# Patient Record
Sex: Female | Born: 1985 | Race: White | Hispanic: No | Marital: Single | Smoking: Never smoker
Health system: Southern US, Community
[De-identification: ages and names within clinical notes are randomized; demographics above are authoritative.]

## PROBLEM LIST (undated history)

## (undated) DIAGNOSIS — R011 Cardiac murmur, unspecified: Secondary | ICD-10-CM

## (undated) DIAGNOSIS — G43909 Migraine, unspecified, not intractable, without status migrainosus: Secondary | ICD-10-CM

## (undated) HISTORY — DX: Migraine, unspecified, not intractable, without status migrainosus: G43.909

## (undated) HISTORY — DX: Cardiac murmur, unspecified: R01.1

---

## 2013-11-18 ENCOUNTER — Encounter: Payer: Self-pay | Admitting: Adult Health

## 2013-11-18 ENCOUNTER — Ambulatory Visit (INDEPENDENT_AMBULATORY_CARE_PROVIDER_SITE_OTHER): Payer: Managed Care, Other (non HMO) | Admitting: Adult Health

## 2013-11-18 ENCOUNTER — Encounter (INDEPENDENT_AMBULATORY_CARE_PROVIDER_SITE_OTHER): Payer: Self-pay

## 2013-11-18 VITALS — BP 102/72 | HR 78 | Temp 97.8°F | Resp 14 | Ht 60.1 in | Wt 91.0 lb

## 2013-11-18 DIAGNOSIS — Z3009 Encounter for other general counseling and advice on contraception: Secondary | ICD-10-CM

## 2013-11-18 DIAGNOSIS — Z30011 Encounter for initial prescription of contraceptive pills: Secondary | ICD-10-CM | POA: Insufficient documentation

## 2013-11-18 MED ORDER — LEVONORGEST-ETH ESTRAD 91-DAY 0.15-0.03 &0.01 MG PO TABS: 1.0000 | ORAL_TABLET | Freq: Every day | ORAL | Status: DC

## 2013-11-18 NOTE — Patient Instructions (Signed)
  Take Vitamin D3 - 2000 units daily  Also, add B12 1000 mcg daily.  Start [place] on the Sunday after you begin your period. Take at the same time daily. You will take an active pill daily for 12 weeks consecutively then no pills on week #13. This is when you should get your period. You may experience spotting the first 2 cycles (12 week cycles) and should resolved by the third cycle.   Call if you have any questions. You can also send me a [employer]. Instructions on how to activate are at the end of the form.  Schedule your complete physical exam at your earliest convenience.

## 2013-11-18 NOTE — Progress Notes (Signed)
Pre visit review using our clinic review tool, if applicable. No additional management support is needed unless otherwise documented below in the visit note. 

## 2013-11-18 NOTE — Progress Notes (Signed)
Patient ID: Ebony Lawson, female   DOB: [birth year], 28 y.o.   MRN: [id number]   Subjective:    Patient ID: Ebony Lawson, female    DOB: [birth year], 28 y.o.   MRN: [id number]  HPI  Pt is a pleasant 28 y/o female who presents to clinic to establish care. She was followed by a primary care provider in [facility]. She recently moved to the area.  PAP - 1-2 years ago. Reports normal. Dentist - Every 6 months    Past Medical History  Diagnosis Date  . Migraine   . Heart murmur      History reviewed. No pertinent past surgical history.   Family History  Problem Relation Age of Onset  . Hyperlipidemia Mother   . Cancer Father     prostate and skin cancer  . Hyperlipidemia Father   . Heart disease Father     CAD - triple bypass 2014  . Hypertension Father   . Hearing loss Paternal Grandfather   . Hypertension Paternal Grandfather      History   Social History  . Marital Status: Single    Spouse Name: N/A    Number of Children: 0  . Years of Education: 16   Occupational History  . 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 Smoking status: Never Smoker   . Smokeless tobacco: Never Used  . Alcohol Use: Yes     Comment: 2-3 drinks monthly  . Drug Use: No  . Sexual Activity: Not on file   Other Topics Concern  . Not on file   Social History Narrative   Ebony Lawson grew up outside of [place]. 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   HENT: Negative.   Eyes: Negative.   Respiratory: Negative.   Cardiovascular: Negative.   Gastrointestinal: Negative.   Endocrine: Negative.   Genitourinary: Negative.   Musculoskeletal: Negative.   Skin: Negative.   Allergic/Immunologic: Negative.   Neurological: Negative.   Hematological:  Negative.   Psychiatric/Behavioral: Negative.        Objective:  BP 102/72  Pulse 78  Temp(Src) 97.8 F (36.6 C) (Oral)  Resp 14  Ht 5' 0.1" (1.527 m)  Wt 91 lb (41.277 kg)  BMI 17.70 kg/m2  SpO2 100%  LMP 10/20/2013   Physical Exam  Constitutional: She is oriented to person, place, and time. She appears well-developed and well-nourished. No distress.  HENT:  Head: Normocephalic and atraumatic.  Eyes: Conjunctivae and EOM are normal.  Neck: Normal range of motion. Neck supple.  Cardiovascular: Normal rate, regular rhythm and intact distal pulses.  Exam reveals no gallop and no friction rub.   No murmur heard. Pulmonary/Chest: Effort normal and breath sounds normal. No respiratory distress. She has no wheezes. She has no rales.  Musculoskeletal: Normal range of motion. She exhibits no edema.  Neurological: She is alert and oriented to person, place, and time. She has normal reflexes.  Skin: Skin is warm and dry.  Psychiatric: She has a normal mood and affect. Her behavior is normal. Judgment and thought content normal.      Assessment & Plan:   1. BCP (birth control pills) initiation Start [place] on the Sunday after  you begin your period. Take at the same time daily. Take an active pill daily for 12 weeks consecutively then no pills on week #13. Spotting may occur the first 2 cycles (12 week cycles) and should resolved by the third cycle.

## 2013-12-09 ENCOUNTER — Encounter: Payer: Self-pay | Admitting: Adult Health

## 2013-12-09 ENCOUNTER — Ambulatory Visit (INDEPENDENT_AMBULATORY_CARE_PROVIDER_SITE_OTHER): Payer: Managed Care, Other (non HMO) | Admitting: Adult Health

## 2013-12-09 VITALS — BP 100/68 | HR 90 | Temp 98.1°F | Resp 14 | Ht 60.1 in | Wt 92.2 lb

## 2013-12-09 DIAGNOSIS — Z124 Encounter for screening for malignant neoplasm of cervix: Secondary | ICD-10-CM | POA: Insufficient documentation

## 2013-12-09 DIAGNOSIS — Z30011 Encounter for initial prescription of contraceptive pills: Secondary | ICD-10-CM

## 2013-12-09 DIAGNOSIS — Z0000 Encounter for general adult medical examination without abnormal findings: Secondary | ICD-10-CM

## 2013-12-09 NOTE — Patient Instructions (Signed)
  You had your annual physical exam today.  Please have your fasting labs at [facility] at your earliest convenience. I will contact you with results once they are available.  Please call with any questions or concerns.  Return in 1 year for your physical or sooner if necessary.

## 2013-12-09 NOTE — Progress Notes (Signed)
Pre visit review using our clinic review tool, if applicable. No additional management support is needed unless otherwise documented below in the visit note. 

## 2013-12-09 NOTE — Progress Notes (Signed)
Patient ID: Ebony Lawson, female   DOB: 02-15-1986, 28 y.o.   MRN: [id number]   Subjective:    Patient ID: Ebony Lawson, female    DOB: Aug 31, 1985, 28 y.o.   MRN: [id number]  HPI  Past Medical History  Diagnosis Date  . Migraine   . Heart murmur     Current Outpatient Prescriptions on File Prior to Visit  Medication Sig Dispense Refill  . Adapalene 0.3 % gel Apply topically at bedtime.      . Calcium Carbonate-Vitamin D (CALCIUM 600+D3 PO) Take 1 tablet by mouth daily.      . Levonorgestrel-Ethinyl Estradiol (AMETHIA,CAMRESE) 0.15-0.03 &0.01 MG tablet Take 1 tablet by mouth daily.  1 Package  4  . Pediatric Multivit-Minerals-C (COMPLETE MULTI-VITAMIN) CHEW Chew 1 tablet by mouth daily. Gummy vitamin       No current facility-administered medications on file prior to visit.     Review of Systems  Constitutional: Negative.   HENT: Negative.   Eyes: Negative.   Respiratory: Negative.   Cardiovascular: Negative.   Gastrointestinal: Negative.   Endocrine: Negative.   Genitourinary: Negative.   Musculoskeletal: Negative.   Skin: Negative.   Allergic/Immunologic: Negative.   Neurological: Negative.   Hematological: Negative.   Psychiatric/Behavioral: Negative.        Objective:  BP 100/68  Pulse 90  Temp(Src) 98.1 F (36.7 C) (Oral)  Resp 14  Wt 92 lb 4 oz (41.844 kg)  SpO2 96%  LMP 12/02/2013   Physical Exam  Constitutional: She is oriented to person, place, and time. She appears well-developed and well-nourished. No distress.  HENT:  Head: Normocephalic and atraumatic.  Right Ear: External ear normal.  Left Ear: External ear normal.  Nose: Nose normal.  Mouth/Throat: Oropharynx is clear and moist.  Eyes: Conjunctivae and EOM are normal. Pupils are equal, round, and reactive to light.  Neck: Normal range of motion. Neck supple. No tracheal deviation present. No thyromegaly present.  Cardiovascular: Normal rate, regular rhythm, normal heart sounds and intact  distal pulses.  Exam reveals no gallop and no friction rub.   No murmur heard. Pulmonary/Chest: Effort normal and breath sounds normal. No respiratory distress. She has no wheezes. She has no rales.  Abdominal: Soft. Bowel sounds are normal. She exhibits no distension and no mass. There is no tenderness. There is no rebound and no guarding.  Musculoskeletal: Normal range of motion. She exhibits no edema and no tenderness.  Lymphadenopathy:    She has no cervical adenopathy.  Neurological: She is alert and oriented to person, place, and time. She has normal reflexes. No cranial nerve deficit. Coordination normal.  Skin: Skin is warm and dry.  Psychiatric: She has a normal mood and affect. Her behavior is normal. Judgment and thought content normal.      Assessment & Plan:   1. Routine general medical examination at a health care facility Normal physical exam excluding PAP. She will be due for her PAP in 2016. Screenings addressed. She reports having gardasil vaccination series. We have not received records from previous provider yet. Return in 1 year or prn.

## 2013-12-10 LAB — CBC AND DIFFERENTIAL
HEMATOCRIT: 37 % (ref 36–46)
Hemoglobin: 12.7 g/dL (ref 12.0–16.0)
Neutrophils Absolute: 3 /uL
Platelets: 228 10*3/uL (ref 150–399)
WBC: 6.3 10^3/mL

## 2013-12-10 LAB — HEPATIC FUNCTION PANEL
ALT: 11 U/L (ref 7–35)
AST: 10 U/L — AB (ref 13–35)
Alkaline Phosphatase: 41 U/L (ref 25–125)
Bilirubin, Total: 0.3 mg/dL

## 2013-12-10 LAB — BASIC METABOLIC PANEL
BUN: 11 mg/dL (ref 4–21)
Creatinine: 0.6 mg/dL (ref <=1.1)
GLUCOSE: 92 mg/dL
POTASSIUM: 4.1 mmol/L (ref 3.4–5.3)
Sodium: 140 mmol/L (ref 137–147)

## 2013-12-10 LAB — LIPID PANEL
Cholesterol: 130 mg/dL (ref 0–200)
HDL: 49 mg/dL (ref 35–70)
LDL Cholesterol: 56 mg/dL
Triglycerides: 123 mg/dL (ref 40–160)

## 2013-12-10 LAB — TSH: TSH: 2.44 u[IU]/mL (ref <=5.90)

## 2013-12-23 ENCOUNTER — Telehealth: Payer: Self-pay

## 2013-12-23 NOTE — Telephone Encounter (Signed)
Per Raquel: Spoke to patient and notified her that her Vitamin D levels are low. Raquel would like you to start OTC D3 1,000 units daily. Patient verbalized understanding.

## 2014-01-01 ENCOUNTER — Encounter: Payer: Self-pay | Admitting: Adult Health

## 2014-12-08 ENCOUNTER — Other Ambulatory Visit: Payer: Self-pay | Admitting: *Deleted

## 2014-12-08 MED ORDER — LEVONORGEST-ETH ESTRAD 91-DAY 0.15-0.03 &0.01 MG PO TABS: 1.0000 | ORAL_TABLET | Freq: Every day | ORAL | Status: DC

## 2014-12-08 NOTE — Telephone Encounter (Signed)
Appt 12/16/14

## 2014-12-16 ENCOUNTER — Encounter: Payer: Managed Care, Other (non HMO) | Admitting: Adult Health

## 2014-12-24 ENCOUNTER — Other Ambulatory Visit (HOSPITAL_COMMUNITY)
Admission: RE | Admit: 2014-12-24 | Discharge: 2014-12-24 | Disposition: A | Discharge status: Home / Self Care | Payer: Managed Care, Other (non HMO) | Source: Ambulatory Visit | Attending: Nurse Practitioner | Admitting: Nurse Practitioner

## 2014-12-24 ENCOUNTER — Ambulatory Visit (INDEPENDENT_AMBULATORY_CARE_PROVIDER_SITE_OTHER): Payer: Managed Care, Other (non HMO) | Admitting: Nurse Practitioner

## 2014-12-24 ENCOUNTER — Encounter: Payer: Self-pay | Admitting: Nurse Practitioner

## 2014-12-24 VITALS — BP 102/66 | HR 80 | Temp 97.8°F | Resp 14 | Ht 60.0 in | Wt 97.0 lb

## 2014-12-24 DIAGNOSIS — Z0000 Encounter for general adult medical examination without abnormal findings: Secondary | ICD-10-CM

## 2014-12-24 DIAGNOSIS — Z1151 Encounter for screening for human papillomavirus (HPV): Secondary | ICD-10-CM | POA: Diagnosis present

## 2014-12-24 DIAGNOSIS — Z309 Encounter for contraceptive management, unspecified: Secondary | ICD-10-CM

## 2014-12-24 DIAGNOSIS — Z01419 Encounter for gynecological examination (general) (routine) without abnormal findings: Secondary | ICD-10-CM | POA: Insufficient documentation

## 2014-12-24 DIAGNOSIS — R87810 Cervical high risk human papillomavirus (HPV) DNA test positive: Secondary | ICD-10-CM | POA: Insufficient documentation

## 2014-12-24 DIAGNOSIS — Z30011 Encounter for initial prescription of contraceptive pills: Secondary | ICD-10-CM

## 2014-12-24 MED ORDER — NORETHINDRONE ACET-ETHINYL EST 1-20 MG-MCG PO TABS: 1.0000 | ORAL_TABLET | Freq: Every day | ORAL | Status: DC

## 2014-12-24 NOTE — Assessment & Plan Note (Signed)
Pt does not care for her 91 day cycle medication. She is experiencing breakthrough bleeding after being on it for 1 year. Will switch to loestrin. FU prn worsening/failure to improve.

## 2014-12-24 NOTE — Progress Notes (Signed)
Pre visit review using our clinic review tool, if applicable. No additional management support is needed unless otherwise documented below in the visit note. 

## 2014-12-24 NOTE — Patient Instructions (Signed)

## 2014-12-24 NOTE — Addendum Note (Signed)
Addended by: Karlene Einstein D on: 12/24/2014 02:10 PM   Modules accepted: Orders

## 2014-12-24 NOTE — Progress Notes (Signed)
Subjective:    Patient ID: Ebony Lawson, female    DOB: 1985-09-11, 29 y.o.   MRN: [id number]  HPI  Ebony Lawson is a 29 yo female with a need for her annual physical.   1) Health Maintenance-   Diet- No formal, tries to eat "well"   Exercise- No formal   Immunizations- UTD  Pap- Due today  Eye Exam- Not UTD  Dental Exam- UTD  2) Chronic Problems-  Heart Murmur- Innocent murmur since childhood  Migraine- occasional migraines- not worsening, 1 aura   3) Acute Problems-  Uses 3 month long OCP- spotting between packs and has been taking for 1 year.   Left knee- up and down stairs, notices it when she goes hiking, denies instability, popping/clicking   Pressure when going down stairs like something "detatched"   Review of Systems  Constitutional: Negative for fever, chills, diaphoresis, fatigue and unexpected weight change.  HENT: Negative for tinnitus and trouble swallowing.   Eyes: Negative for visual disturbance.  Respiratory: Negative for chest tightness, shortness of breath and wheezing.   Cardiovascular: Negative for chest pain, palpitations and leg swelling.  Gastrointestinal: Negative for nausea, vomiting, abdominal pain, diarrhea, constipation and blood in stool.  Genitourinary: Negative for dysuria, hematuria, vaginal discharge and vaginal pain.  Musculoskeletal: Positive for arthralgias. Negative for myalgias, back pain and gait problem.       Left knee  Skin: Negative for color change and rash.  Neurological: Negative for dizziness, weakness, numbness and headaches.  Hematological: Does not bruise/bleed easily.  Psychiatric/Behavioral: Negative for suicidal ideas and sleep disturbance. The patient is not nervous/anxious.    Past Medical History  Diagnosis Date  . Migraine   . Heart murmur     History   Social History  . Marital Status: Single    Spouse Name: N/A  . Number of Children: 0  . Years of Education: 16   Occupational History  . 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 Smoking status: Never Smoker   . Smokeless tobacco: Never Used  . Alcohol Use: 0.0 oz/week    0 Standard drinks or equivalent per week     Comment: 2-3 drinks monthly  . Drug Use: No  . Sexual Activity:    Partners: Male    Birth Control/ Protection: OCP     Comment: 1 partner    Other Topics Concern  . Not on file   Social History Narrative   Ebony Lawson grew up outside of [place]. 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  Family History  Problem Relation Age of Onset  . Hyperlipidemia Mother   . Cancer Father     prostate and skin cancer  . Hyperlipidemia Father   . Heart disease Father     CAD - triple bypass 2014  . Hypertension Father   . Hearing loss Paternal Grandfather   . Hypertension Paternal Grandfather     No Known Allergies  Current Outpatient Prescriptions on File Prior to Visit  Medication Sig Dispense Refill  . Adapalene 0.3 % gel Apply topically at bedtime.    . Calcium Carbonate-Vitamin D (CALCIUM 600+D3 PO) Take 1 tablet by mouth daily.    Marland Kitchen  Pediatric Multivit-Minerals-C (COMPLETE MULTI-VITAMIN) CHEW Chew 1 tablet by mouth daily. Gummy vitamin     No current facility-administered medications on file prior to visit.       Objective:   Physical Exam  Constitutional: She is oriented to person, place, and time. She appears well-developed and well-nourished. No distress.  BP 102/66 mmHg  Pulse 80  Temp(Src) 97.8 F (36.6 C)  Resp 14  Ht 5' (1.524 m)  Wt 97 lb (43.999 kg)  BMI 18.94 kg/m2  SpO2 98%   HENT:  Head: Normocephalic and atraumatic.  Right Ear: External ear normal.  Left Ear: External ear normal.  Eyes: EOM are normal. Pupils are equal, round, and reactive to light. Right eye exhibits  no discharge. Left eye exhibits no discharge. No scleral icterus.  Neck: Normal range of motion. Neck supple. No thyromegaly present.  Cardiovascular: Normal rate and regular rhythm.  Exam reveals no gallop and no friction rub.   Murmur heard. Pt reports murmur- unable to hear one today  Pulmonary/Chest: Effort normal and breath sounds normal. No respiratory distress. She has no wheezes. She has no rales. She exhibits no tenderness.  Abdominal: Soft. Bowel sounds are normal. She exhibits no distension and no mass. There is no tenderness. There is no rebound and no guarding.  Genitourinary: Vagina normal. No vaginal discharge found.  Musculoskeletal: Normal range of motion. She exhibits no edema or tenderness.  Lymphadenopathy:    She has no cervical adenopathy.  Neurological: She is alert and oriented to person, place, and time. No cranial nerve deficit. She exhibits normal muscle tone. Coordination normal.  Skin: Skin is warm and dry. No rash noted. She is not diaphoretic.  Psychiatric: She has a normal mood and affect. Her behavior is normal. Judgment and thought content normal.      Assessment & Plan:

## 2014-12-24 NOTE — Assessment & Plan Note (Addendum)
Discussed acute and chronic issues. Reviewed health maintenance measures, PFSHx, and immunizations. Obtain routine labs TSH, Lipid panel, CBC w/ diff, A1c, and CMET at [facility]  PAP done today, no significant findings, clinical breast exam performed today.

## 2014-12-25 LAB — CYTOLOGY - PAP

## 2014-12-28 ENCOUNTER — Other Ambulatory Visit: Payer: Self-pay | Admitting: Nurse Practitioner

## 2014-12-28 MED ORDER — FLUCONAZOLE 150 MG PO TABS: 150.0000 mg | ORAL_TABLET | Freq: Once | ORAL | Status: DC

## 2014-12-31 LAB — CBC AND DIFFERENTIAL
HCT: 39 % (ref 36–46)
HEMOGLOBIN: 13.2 g/dL (ref 12.0–16.0)
PLATELETS: 180 10*3/uL (ref 150–399)
WBC: 6.3 10*3/mL

## 2014-12-31 LAB — LIPID PANEL
Cholesterol: 163 mg/dL (ref 0–200)
HDL: 47 mg/dL (ref 35–70)
LDL CALC: 102 mg/dL
TRIGLYCERIDES: 69 mg/dL (ref 40–160)

## 2014-12-31 LAB — BASIC METABOLIC PANEL
BUN: 9 mg/dL (ref 4–21)
CREATININE: 0.7 mg/dL (ref 0.5–1.1)
Potassium: 4 mmol/L (ref 3.4–5.3)
Sodium: 138 mmol/L (ref 137–147)

## 2014-12-31 LAB — TSH: TSH: 2.49 u[IU]/mL (ref 0.41–5.90)

## 2014-12-31 LAB — HEPATIC FUNCTION PANEL
ALK PHOS: 33 U/L (ref 25–125)
ALT: 8 U/L (ref 7–35)
AST: 10 U/L — AB (ref 13–35)
Bilirubin, Total: 0.4 mg/dL

## 2014-12-31 LAB — HEMOGLOBIN A1C: HEMOGLOBIN A1C: 5.2 % (ref 4.0–6.0)

## 2015-01-01 ENCOUNTER — Encounter: Payer: Self-pay | Admitting: Nurse Practitioner

## 2015-01-05 ENCOUNTER — Encounter: Payer: Self-pay | Admitting: Nurse Practitioner

## 2015-04-20 ENCOUNTER — Other Ambulatory Visit: Payer: Self-pay | Admitting: Nurse Practitioner

## 2015-04-20 ENCOUNTER — Encounter: Payer: Self-pay | Admitting: Nurse Practitioner

## 2015-04-22 ENCOUNTER — Encounter: Payer: Self-pay | Admitting: Nurse Practitioner

## 2015-04-22 ENCOUNTER — Ambulatory Visit (INDEPENDENT_AMBULATORY_CARE_PROVIDER_SITE_OTHER): Payer: Managed Care, Other (non HMO) | Admitting: Nurse Practitioner

## 2015-04-22 VITALS — BP 108/62 | HR 94 | Temp 98.3°F | Resp 12 | Ht 60.0 in | Wt 96.4 lb

## 2015-04-22 DIAGNOSIS — Z30011 Encounter for initial prescription of contraceptive pills: Secondary | ICD-10-CM

## 2015-04-22 DIAGNOSIS — Z309 Encounter for contraceptive management, unspecified: Secondary | ICD-10-CM

## 2015-04-22 MED ORDER — DROSPIRENONE-ETHINYL ESTRADIOL 3-0.02 MG PO TABS: 1.0000 | ORAL_TABLET | Freq: Every day | ORAL | Status: DC

## 2015-04-22 NOTE — Assessment & Plan Note (Signed)
Switched to [place] to try. Pt has acne that flares around menses. Will try this for 3-6 months. FU by MyChart or in 6 months.

## 2015-04-22 NOTE — Progress Notes (Signed)
Pre visit review using our clinic review tool, if applicable. No additional management support is needed unless otherwise documented below in the visit note. 

## 2015-04-22 NOTE — Patient Instructions (Addendum)
Drospirenone; Ethinyl Estradiol tablets What is this medicine? DROSPIRENONE; ETHINYL ESTRADIOL (dro SPY re nown; ETH in il es tra DYE ole) is an oral contraceptive (birth control pill). This medicine combines two types of female hormones, an estrogen and a progestin. It is used to prevent ovulation and pregnancy. This medicine may be used for other purposes; ask your health care provider or pharmacist if you have questions. What should I tell my health care provider before I take this medicine? They need to know if you have or ever had any of these conditions: -abnormal vaginal bleeding -adrenal gland disease -blood vessel disease or blood clots -breast, cervical, endometrial, ovarian, liver, or uterine cancer -diabetes -gallbladder disease -heart disease or recent heart attack -high blood pressure -high cholesterol -high potassium level -kidney disease -liver disease -migraine headaches -stroke -systemic lupus erythematosus (SLE) -tobacco smoker -an unusual or allergic reaction to estrogens, progestins, or other medicines, foods, dyes, or preservatives -pregnant or trying to get pregnant -breast-feeding How should I use this medicine? Take this medicine by mouth. To reduce nausea, this medicine may be taken with food. Follow the directions on the prescription label. Take this medicine at the same time each day and in the order directed on the package. Do not take your medicine more often than directed. A patient package insert for the product will be given with each prescription and refill. Read this sheet carefully each time. The sheet may change frequently. Talk to your pediatrician regarding the use of this medicine in children. Special care may be needed. This medicine has been used in female children who have started having menstrual periods. Overdosage: If you think you have taken too much of this medicine contact a poison control center or emergency room at once. NOTE: This  medicine is only for you. Do not share this medicine with others. What if I miss a dose? If you miss a dose, refer to the patient information sheet you received with your medicine for direction. If you miss more than one pill, this medicine may not be as effective and you may need to use another form of birth control. What may interact with this medicine? -acetaminophen -antibiotics or medicines for infections, especially rifampin, rifabutin, rifapentine, and griseofulvin, and possibly penicillins or tetracyclines -aprepitant -ascorbic acid (vitamin C) -atorvastatin -barbiturate medicines, such as phenobarbital -bosentan -carbamazepine -caffeine -clofibrate -cyclosporine -dantrolene -doxercalciferol -felbamate -grapefruit juice -hydrocortisone -medicines for anxiety or sleeping problems, such as diazepam or temazepam -medicines for diabetes, including pioglitazone -mineral oil -modafinil -mycophenolate -nefazodone -oxcarbazepine -phenytoin -prednisolone -ritonavir or other medicines for HIV infection or AIDS -rosuvastatin -selegiline -soy isoflavones supplements -St. John's wort -tamoxifen or raloxifene -theophylline -thyroid hormones -topiramate -warfarin This product is different from other birth control pills because it contains the progestin drospirenone. Drospirenone may increase potassium levels. Interactions with other drugs may increase the chance of an elevated potassium level. You may need blood tests to check your potassium level. Drugs that can increase the potassium level include: -certain medications for high blood pressure or heart conditions (examples include ACE-inhibitors and also Angiotensin-II receptor blockers, and Eplerenone -dietary salt substitutes (these may contain potassium) -heparin -NSAIDs (antiinflammatory drugs), if they are taken long-term and daily, like for arthritis -potassium supplements -some 'water pills' (diuretics like amiloride,  spironolactone or triamterene) This list may not describe all possible interactions. Give your health care provider a list of all the medicines, herbs, non-prescription drugs, or dietary supplements you use. Also tell them if you smoke, drink alcohol, or use illegal  drugs. Some items may interact with your medicine. What should I watch for while using this medicine? Visit your doctor or health care professional for regular checks on your progress. You will need a regular breast and pelvic exam and Pap smear while on this medicine. Use an additional method of contraception during the first cycle that you take these tablets. If you have any reason to think you are pregnant, stop taking this medicine right away and contact your doctor or health care professional. If you are taking this medicine for hormone related problems, it may take several cycles of use to see improvement in your condition. Smoking increases the risk of getting a blood clot or having a stroke while you are taking birth control pills, especially if you are more than 27 years old. You are strongly advised not to smoke. This medicine can make your body retain fluid, making your fingers, hands, or ankles swell. Your blood pressure can go up. Contact your doctor or health care professional if you feel you are retaining fluid. This medicine can make you more sensitive to the sun. Keep out of the sun. If you cannot avoid being in the sun, wear protective clothing and use sunscreen. Do not use sun lamps or tanning beds/booths. If you wear contact lenses and notice visual changes, or if the lenses begin to feel uncomfortable, consult your eye care specialist. In some women, tenderness, swelling, or minor bleeding of the gums may occur. Notify your dentist if this happens. Brushing and flossing your teeth regularly may help limit this. See your dentist regularly and inform your dentist of the medicines you are taking. If you are going to have  elective surgery, you may need to stop taking this medicine before the surgery. Consult your health care professional for advice. This medicine does not protect you against HIV infection (AIDS) or any other sexually transmitted diseases. What side effects may I notice from receiving this medicine? Side effects that you should report to your doctor or health care professional as soon as possible: -allergic reactions like skin rash, itching or hives, swelling of the face, lips, or tongue -breast tissue changes or discharge -changes in vision -chest pain -confusion, trouble speaking or understanding -dark urine -general ill feeling or flu-like symptoms -light-colored stools -nausea, vomiting -pain, swelling, warmth in the leg -right upper belly pain -severe headaches -shortness of breath -sudden numbness or weakness of the face, arm or leg -trouble walking, dizziness, loss of balance or coordination -unusual vaginal bleeding -yellowing of the eyes or skin Side effects that usually do not require medical attention (report to your doctor or health care professional if they continue or are bothersome): -acne -brown spots on the face -change in appetite -change in sexual desire -depressed mood or mood swings -fluid retention and swelling -stomach cramps or bloating -unusually weak or tired -weight gain This list may not describe all possible side effects. Call your doctor for medical advice about side effects. You may report side effects to FDA at 1-800-FDA-1088. Where should I keep my medicine? Keep out of the reach of children. Store at room temperature between 15 and 30 degrees C (59 and 86 degrees F). Throw away any unused medicine after the expiration date. NOTE: This sheet is a summary. It may not cover all possible information. If you have questions about this medicine, talk to your doctor, pharmacist, or health care provider.    2016, Elsevier/Gold Standard. (2008-05-07  13:02:54)

## 2015-04-22 NOTE — Progress Notes (Signed)
Patient ID: Ebony Lawson, female    DOB: 1986/03/08  Age: 29 y.o. MRN: [id number]  CC: Medication Problem   HPI Ebony Lawson presents for follow up of birth control management.   1) Breakthrough bleeding on 3 month OCP.  Switched in July to Loestrin Still having break through  Taking at 10:25 pm daily, Denies skipping or missing multiple times (maybe once).  Random spotting on week 3 for 1 week then nothing on week off.   History Ebony Lawson has a past medical history of Migraine and Heart murmur.   She has no past surgical history on file.   Her family history includes Cancer in her father; Hearing loss in her paternal grandfather; Heart disease in her father; Hyperlipidemia in her father and mother; Hypertension in her father and paternal grandfather.She reports that she has never smoked. She has never used smokeless tobacco. She reports that she drinks alcohol. She reports that she does not use illicit drugs.  Outpatient Prescriptions Prior to Visit  Medication Sig Dispense Refill  . Adapalene 0.3 % gel Apply topically at bedtime.    . Calcium Carbonate-Vitamin D (CALCIUM 600+D3 PO) Take 1 tablet by mouth daily.    . fluconazole (DIFLUCAN) 150 MG tablet Take 1 tablet (150 mg total) by mouth once. 1 tablet 0  . Pediatric Multivit-Minerals-C (COMPLETE MULTI-VITAMIN) CHEW Chew 1 tablet by mouth daily. Gummy vitamin    . norethindrone-ethinyl estradiol (MICROGESTIN,JUNEL,LOESTRIN) 1-20 MG-MCG tablet Take 1 tablet by mouth daily. 1 Package 11   No facility-administered medications prior to visit.    ROS Review of Systems  Constitutional: Negative for fever, chills, diaphoresis and fatigue.  Genitourinary: Positive for menstrual problem. Negative for pelvic pain.    Objective:  BP 108/62 mmHg  Pulse 94  Temp(Src) 98.3 F (36.8 C)  Resp 12  Ht 5' (1.524 m)  Wt 96 lb 6.4 oz (43.727 kg)  BMI 18.83 kg/m2  SpO2 96%  Physical Exam  Constitutional: She appears well-developed and  well-nourished. No distress.  Genitourinary:  Deferred  Skin: She is not diaphoretic.  Psychiatric: She has a normal mood and affect. Her behavior is normal. Judgment and thought content normal.   Assessment & Plan:   There are no diagnoses linked to this encounter. I have discontinued Ms. Ebony Lawson's norethindrone-ethinyl estradiol. I am also having her start on drospirenone-ethinyl estradiol. Additionally, I am having her maintain her COMPLETE MULTI-VITAMIN, Calcium Carbonate-Vitamin D (CALCIUM 600+D3 PO), Adapalene, and fluconazole.  Meds ordered this encounter  Medications  . drospirenone-ethinyl estradiol (YAZ,GIANVI,LORYNA) 3-0.02 MG tablet    Sig: Take 1 tablet by mouth daily.    Dispense:  1 Package    Refill:  5    Order Specific Question:  Supervising Provider    Answer:  Ebony Lawson [[id number]]     Follow-up: Return in about 6 months (around 10/20/2015) for Birth Control Maintenance .

## 2015-04-26 ENCOUNTER — Other Ambulatory Visit: Payer: Self-pay | Admitting: Nurse Practitioner

## 2015-04-26 ENCOUNTER — Encounter: Payer: Self-pay | Admitting: Nurse Practitioner

## 2015-10-10 ENCOUNTER — Other Ambulatory Visit: Payer: Self-pay | Admitting: Nurse Practitioner

## 2015-10-11 NOTE — Telephone Encounter (Signed)
Refilled in November. Last seen same time. Please advise?

## 2015-12-28 ENCOUNTER — Encounter: Payer: Managed Care, Other (non HMO) | Admitting: Nurse Practitioner

## 2015-12-30 ENCOUNTER — Encounter: Payer: Managed Care, Other (non HMO) | Admitting: Nurse Practitioner

## 2017-09-06 ENCOUNTER — Other Ambulatory Visit: Payer: Managed Care, Other (non HMO) | Admitting: Nurse Practitioner

## 2017-09-06 ENCOUNTER — Ambulatory Visit: Payer: Managed Care, Other (non HMO) | Admitting: Nurse Practitioner

## 2017-09-06 ENCOUNTER — Encounter: Payer: Self-pay | Admitting: Nurse Practitioner

## 2017-09-06 VITALS — BP 103/71 | HR 87 | Resp 16 | Ht 60.0 in | Wt 96.4 lb

## 2017-09-06 DIAGNOSIS — F411 Generalized anxiety disorder: Secondary | ICD-10-CM

## 2017-09-06 DIAGNOSIS — R87820 Cervical low risk human papillomavirus (HPV) DNA test positive: Secondary | ICD-10-CM

## 2017-09-06 DIAGNOSIS — Z124 Encounter for screening for malignant neoplasm of cervix: Secondary | ICD-10-CM | POA: Diagnosis not present

## 2017-09-06 NOTE — Progress Notes (Signed)
[facility] [place], [place] [address]  Internal MEDICINE  Office Visit Note  Patient Name: Ebony Lawson  [id number]  [id number]  Date of Service: 09/07/2017   Chief Complaint  Patient presents with  . Gynecologic Exam    history of abnormal pap with HPV positive  . Anxiety     The patient is here for routine follow up visit. She has diagnosed history of abnormal Pap, ASC-us with HPV positive. She did have a consultation with GYN, who told her that everything looked ok and she should continue to be followed by PCP.  Today, she is c/o of anxiety. Sometimes she is having panic reactions. Would like to seek help from a counselor. Does not want to take or be on any medications for this.   Pt is here for a pap smear   Current Medication: Outpatient Encounter Medications as of 09/06/2017  Medication Sig  . Adapalene 0.3 % gel Apply topically at bedtime.  . Calcium Carbonate-Vitamin D (CALCIUM 600+D3 PO) Take 1 tablet by mouth daily.  . drospirenone-ethinyl estradiol (YAZ,GIANVI,LORYNA) 3-0.02 MG tablet take 1 tablet by mouth daily (Patient not taking: Reported on 09/06/2017)  . fluconazole (DIFLUCAN) 150 MG tablet Take 1 tablet (150 mg total) by mouth once. (Patient not taking: Reported on 09/06/2017)  . Melatonin 10 MG TABS Take 1 tablet by mouth at bedtime as needed.  . Pediatric Multivit-Minerals-C (COMPLETE MULTI-VITAMIN) CHEW Chew 1 tablet by mouth daily. Gummy vitamin   No facility-administered encounter medications on file as of 09/06/2017.     Surgical History: No past surgical history on file.  Medical History: Past Medical History:  Diagnosis Date  . Heart murmur   . Migraine     Family History: Family History  Problem Relation Age of Onset  . Hyperlipidemia Mother   . Cancer Father        prostate and skin cancer  . Hyperlipidemia Father   . Heart disease Father        CAD - triple bypass 2014  . Hypertension Father   . Hearing loss  Paternal Grandfather   . Hypertension Paternal Grandfather     Social History   Socioeconomic History  . Marital status: Single    Spouse name: Not on file  . Number of children: 0  . Years of education: 57  . Highest education level: Not on file  Occupational History  . Occupation: DNA Animal nutritionist: [employer]  . Financial resource strain: Not on file  . Food insecurity:    Worry: Not on file    Inability: Not on file  . Transportation needs:    Medical: Not on file    Non-medical: Not on file  Tobacco Use  . Smoking status: Never Smoker  . Smokeless tobacco: Never Used  Substance and Sexual Activity  . Alcohol use: Yes    Alcohol/week: 0.0 oz    Comment: 2-3 drinks monthly  . Drug use: No  . Sexual activity: Yes    Partners: Male    Birth control/protection: OCP    Comment: 1 partner   Lifestyle  . Physical activity:    Days per week: Not on file    Minutes per session: Not on file  . Stress: Not on file  Relationships  . Social connections:    Talks on phone: Not on file    Gets together: Not on file    Attends religious service: Not  on file    Active member of club or organization: Not on file    Attends meetings of clubs or organizations: Not on file    Relationship status: Not on file  . Intimate partner violence:    Fear of current or ex partner: Not on file    Emotionally abused: Not on file    Physically abused: Not on file    Forced sexual activity: Not on file  Other Topics Concern  . Not on file  Social History Narrative   Jiana grew up outside of [facility]. She attended [employer] and obtained a Manufacturing engineer in [employer] in [place]. 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  HENT: Positive for postnasal drip and  rhinorrhea. Negative for congestion, sneezing and sore throat.   Eyes: Negative.  Negative for redness.  Respiratory: Negative for cough, chest tightness, shortness of breath and wheezing.   Cardiovascular: Negative for chest pain and palpitations.  Gastrointestinal: Negative for abdominal pain, constipation, diarrhea, nausea and vomiting.  Endocrine: Negative for cold intolerance, heat intolerance, polydipsia, polyphagia and polyuria.  Genitourinary: Negative for dysuria and frequency.       History of abnormal pap smears with HPV positive.  Regular menstrual periods.  Musculoskeletal: Negative for arthralgias, back pain, joint swelling and neck pain.  Skin: Negative for rash.  Allergic/Immunologic: Positive for environmental allergies.  Neurological: Negative.  Negative for tremors and numbness.  Hematological: Negative for adenopathy. Does not bruise/bleed easily.  Psychiatric/Behavioral: Positive for behavioral problems (Depression). Negative for sleep disturbance and suicidal ideas. The patient is nervous/anxious.      Today's Vitals   09/06/17 1406  BP: 103/71  Pulse: 87  Resp: 16  SpO2: 98%  Weight: 96 lb 6.4 oz (43.7 kg)  Height: 5' (1.524 m)    Physical Exam  Constitutional: She is oriented to person, place, and time. She appears well-developed and well-nourished. No distress.  HENT:  Head: Normocephalic and atraumatic.  Mouth/Throat: Oropharynx is clear and moist. No oropharyngeal exudate.  Eyes: Pupils are equal, round, and reactive to light. EOM are normal.  Neck: Normal range of motion. Neck supple. No JVD present. No tracheal deviation present. No thyromegaly present.  Cardiovascular: Normal rate, regular rhythm and normal heart sounds. Exam reveals no gallop and no friction rub.  No murmur heard. Pulmonary/Chest: Effort normal. No respiratory distress. She has no wheezes. She has no rales. She exhibits no tenderness.  Abdominal: Soft. Bowel sounds are normal.   Genitourinary: Vagina normal. Pelvic exam was performed with patient supine. No labial fusion. There is no rash, tenderness, lesion or injury on the right labia. There is no rash, tenderness, lesion or injury on the left labia. Cervix exhibits no motion tenderness, no discharge and no friability.  Musculoskeletal: Normal range of motion.  Lymphadenopathy:    She has no cervical adenopathy.  Neurological: She is alert and oriented to person, place, and time. No cranial nerve deficit.  Skin: Skin is warm and dry. She is not diaphoretic.  Psychiatric: Her speech is normal and behavior is normal. Judgment and thought content normal. Her mood appears anxious. Cognition and memory are normal.  Nursing note and vitals reviewed.   Assessment/Plan:  1. 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 New pap obtained today. Will closely monitor as indicated.  2. Routine cervical smear - Pap IG and HPV (high risk) DNA detection  3. Generalized anxiety disorder - Ambulatory referral to Psychiatry    General Counseling: Ebony Lawson verbalizes understanding of the findings of todays visit and agrees with plan of treatment. I have discussed any further diagnostic evaluation that may be needed or ordered today. We also reviewed her medications today. she has been encouraged to call the office with any questions or concerns that should arise related to todays visit.  This patient was seen by Leretha Pol, FNP- C in Collaboration with Dr Lavera Guise as a part of collaborative care agreement    Orders Placed This Encounter  Procedures  . Ambulatory referral to Psychiatry     Time spent: 25 Minutes

## 2017-09-07 ENCOUNTER — Encounter: Payer: Self-pay | Admitting: Nurse Practitioner

## 2017-09-07 DIAGNOSIS — R87820 Cervical low risk human papillomavirus (HPV) DNA test positive: Secondary | ICD-10-CM | POA: Insufficient documentation

## 2017-09-07 DIAGNOSIS — F411 Generalized anxiety disorder: Secondary | ICD-10-CM | POA: Insufficient documentation

## 2017-09-10 LAB — PAP IG AND HPV HIGH-RISK
HPV, high-risk: POSITIVE — AB
PAP Smear Comment: 0

## 2017-09-12 ENCOUNTER — Telehealth: Payer: Self-pay

## 2017-09-12 NOTE — Telephone Encounter (Signed)
Left vm for pt to return call on lab (pap) results.  dbs

## 2017-09-14 ENCOUNTER — Telehealth: Payer: Self-pay

## 2017-09-14 NOTE — Telephone Encounter (Signed)
Pt returned call about labs and I gave results and informed her that we will repeat pap in 1 year.  dbs

## 2017-10-31 ENCOUNTER — Encounter: Payer: Self-pay | Admitting: Nurse Practitioner

## 2017-11-09 ENCOUNTER — Encounter: Payer: Self-pay | Admitting: Nurse Practitioner

## 2018-01-17 ENCOUNTER — Telehealth: Payer: Self-pay | Admitting: Nurse Practitioner

## 2018-01-17 NOTE — Telephone Encounter (Signed)
Patient called needing referral resent for [facility], I resent the referral through epic. Beth

## 2018-02-15 ENCOUNTER — Ambulatory Visit: Payer: 59 | Admitting: Psychiatry

## 2018-02-15 ENCOUNTER — Encounter: Payer: Self-pay | Admitting: Psychiatry

## 2018-02-15 ENCOUNTER — Other Ambulatory Visit: Payer: Self-pay

## 2018-02-15 VITALS — BP 112/71 | HR 89 | Temp 98.0°F | Wt 101.8 lb

## 2018-02-15 DIAGNOSIS — G4700 Insomnia, unspecified: Secondary | ICD-10-CM

## 2018-02-15 DIAGNOSIS — F411 Generalized anxiety disorder: Secondary | ICD-10-CM

## 2018-02-15 MED ORDER — HYDROXYZINE HCL 10 MG PO TABS
10.0000 mg | ORAL_TABLET | Freq: Two times a day (BID) | ORAL | 0 refills | Status: DC | PRN
Start: 2018-02-15 — End: 2018-06-11

## 2018-02-15 NOTE — Progress Notes (Signed)
Psychiatric Initial Adult Assessment   Patient Identification: Ebony Lawson MRN:  [id number] Date of Evaluation:  02/15/2018 Referral Source: Leretha Pol NP Chief Complaint:  ' I am here to establish care." Chief Complaint    Establish Care; Anxiety; Stress; Fatigue     Visit Diagnosis:    ICD-10-CM   1. GAD (generalized anxiety disorder) F41.1 hydrOXYzine (ATARAX/VISTARIL) 10 MG tablet  2. Insomnia, unspecified type G47.00     History of Present Illness:  Ebony Lawson is a 32 year old Caucasian female, single, employed, lives in [place], has a history of anxiety symptoms and sleep problems, presented to the clinic today to establish care.  Patient reports she has been struggling with anxiety since the past 2 years or so.  She reports her anxiety symptoms are currently worsening.  She reports feeling nervous, on edge, inability to stop worrying, worrying too much about different things, trouble relaxing, being easily irritable, feeling afraid something awful might happen and so on on a regular basis.  She reports she hence decided to get help before it gets too bad.  She reports her brother also struggles with anxiety symptoms and he has tried several medications already and has been unable to work several days and she hence decided to get help for herself before her condition also worsens.  Patient reports sleep problems.  She reports there are days when she worries too much and is unable to sleep.  She also reports her sleep problems are also due to her shift at work.  She works third shift.  She has been working third shift since the past 6 years.  She reports she has to switch back and forth on weekends.  On weekends she tries to sleep at night and not during the day.  She reports she has tried melatonin as needed which helps to some extent.  She works at [employer].  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  Patient denies any depressive  symptoms.  Patient denies any bipolar symptoms  Patient denies any history of trauma.  Patient denies any suicidality or homicidality.  Patient denies any perceptual disturbances.  Patient denies any substance abuse problems.  Patient reports she has never been tried on medications before and is interested in psychotherapy as well as as needed medications.  She reports she would like to try something as needed before starting a medication that she needs to take daily.    Associated Signs/Symptoms: Depression Symptoms:  insomnia, fatigue, anxiety, (Hypo) Manic Symptoms:  denies Anxiety Symptoms:  Excessive Worry, Psychotic Symptoms:  denies PTSD Symptoms: Negative  Past Psychiatric History: Patient denies ever being to a psychiatrist before.  Patient denies any history of psychotherapy.  Patient denies inpatient mental health admissions.  Previous Psychotropic Medications: No   Substance Abuse History in the last 12 months:  No.  Consequences of Substance Abuse: Negative  Past Medical History:  Past Medical History:  Diagnosis Date  . Heart murmur   . Migraine    History reviewed. No pertinent surgical history.  Family Psychiatric History: Brother-anxiety disorder.  Family History:  Family History  Problem Relation Age of Onset  . Hyperlipidemia Mother   . Cancer Father        prostate and skin cancer  . Hyperlipidemia Father   . Heart disease Father        CAD - triple bypass 2014  . Hypertension Father   . Hearing loss Paternal Grandfather   . Hypertension Paternal  Grandfather   . Anxiety disorder Brother     Social History:   Social History   Socioeconomic History  . Marital status: Single    Spouse name: Not on file  . Number of children: 0  . Years of education: 16  . Highest education level: Bachelor's degree (e.g., BA, AB, BS)  Occupational History  . Occupation: DNA Animal nutritionist: [employer]  . Financial  resource strain: Not hard at all  . Food insecurity:    Worry: Never true    Inability: Never true  . Transportation needs:    Medical: No    Non-medical: No  Tobacco Use  . Smoking status: Never Smoker  . Smokeless tobacco: Never Used  Substance and Sexual Activity  . Alcohol use: Not Currently    Alcohol/week: 0.0 standard drinks    Comment: 2-3 drinks monthly  . Drug use: No  . Sexual activity: Yes    Partners: Male    Birth control/protection: OCP    Comment: 1 partner   Lifestyle  . Physical activity:    Days per week: 4 days    Minutes per session: 50 min  . Stress: Very much  Relationships  . Social connections:    Talks on phone: Not on file    Gets together: Not on file    Attends religious service: More than 4 times per year    Active member of club or organization: Yes    Attends meetings of clubs or organizations: More than 4 times per year    Relationship status: Never married  Other Topics Concern  . Not on file  Social History Narrative   Ebony Lawson grew up outside of [facility]. She attended [employer] and obtained a Manufacturing engineer in [employer] in [place]. 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  She has a boyfriend and reports her relationship with him is going well.  She currently works at [employer].  Her parents and her brother lives in [place].  She reports she has a good relationship with her family.  She denies having children.  Allergies:  No Known Allergies  Metabolic Disorder Labs: Lab Results  Component Value Date   HGBA1C 5.2 12/31/2014   No results found for: PROLACTIN Lab Results  Component Value Date   CHOL 163 12/31/2014   TRIG 69 12/31/2014   HDL 47 12/31/2014   LDLCALC 102 12/31/2014   LDLCALC 56 12/10/2013     Current Medications: Current Outpatient  Medications  Medication Sig Dispense Refill  . Adapalene 0.3 % gel Apply topically at bedtime.    . Calcium Carbonate-Vitamin D (CALCIUM 600+D3 PO) Take 1 tablet by mouth daily.    . drospirenone-ethinyl estradiol (YAZ,GIANVI,LORYNA) 3-0.02 MG tablet take 1 tablet by mouth daily 28 tablet 5  . fluconazole (DIFLUCAN) 150 MG tablet Take 1 tablet (150 mg total) by mouth once. 1 tablet 0  . Melatonin 10 MG TABS Take 1 tablet by mouth at bedtime as needed.    . Pediatric Multivit-Minerals-C (COMPLETE MULTI-VITAMIN) CHEW Chew 1 tablet by mouth daily. Gummy vitamin    . tretinoin (RETIN-A) 0.025 % cream     . hydrOXYzine (ATARAX/VISTARIL) 10 MG tablet Take 1-2 tablets (10-20 mg total)  by mouth 2 (two) times daily as needed. For severe anxiety 100 tablet 0   No current facility-administered medications for this visit.     Neurologic: Headache: No Seizure: No Paresthesias:No  Musculoskeletal: Strength & Muscle Tone: within normal limits Gait & Station: normal Patient leans: N/A  Psychiatric Specialty Exam: Review of Systems  Psychiatric/Behavioral: The patient is nervous/anxious and has insomnia.   All other systems reviewed and are negative.   Blood pressure 112/71, pulse 89, temperature 98 F (36.7 C), temperature source Oral, weight 101 lb 12.8 oz (46.2 kg), last menstrual period 02/15/2018.Body mass index is 19.88 kg/m.  General Appearance: Casual  Eye Contact:  Fair  Speech:  Clear and Coherent  Volume:  Normal  Mood:  Anxious  Affect:  Congruent  Thought Process:  Goal Directed and Descriptions of Associations: Intact  Orientation:  Full (Time, Place, and Person)  Thought Content:  Logical  Suicidal Thoughts:  No  Homicidal Thoughts:  No  Memory:  Immediate;   Fair Recent;   Fair Remote;   Fair  Judgement:  Fair  Insight:  Fair  Psychomotor Activity:  Normal  Concentration:  Concentration: Fair and Attention Span: Fair  Recall:  [employer] of Knowledge:Fair  Language:  Fair  Akathisia:  No  Handed:  Right  AIMS (if indicated):  na  Assets:  Communication Skills Desire for Improvement Housing Social Support  ADL's:  Intact  Cognition: WNL  Sleep: disrupted    Treatment Plan Summary:Laporcha is a 32 year old Caucasian female, single, employed, lives in [place], has a history of anxiety symptoms and sleep problems, however denies past psychiatric diagnosis or treatment history.  Patient is biologically predisposed given her family history of mental health problems.  She also has psychosocial stressors of her work as well as her shift at work which affects her sleep.  Patient denies any substance abuse problem.  Patient denies any suicidality.  Patient is currently motivated to pursue psychotherapy.  She is only interested in an as needed medication for her anxiety at this time.  She would like to give it more time before starting an SSRI. Plan as noted below Medication management and Plan as noted below  Plan Generalized anxiety disorder Refer for CBT. Start hydroxyzine 10-20 mg p.o. twice daily as needed for severe anxiety attacks GAD 7 equals 10  For insomnia unspecified Likely due to anxiety as well as the shift at work Discussed sleep hygiene techniques. She will continue to make use of melatonin as needed  We will get the following labs-TSH  Follow-up in clinic in 3-4 weeks or sooner if needed.  More than 50 % of the time was spent for psychoeducation and supportive psychotherapy and care coordination.  This note was generated in part or whole with voice recognition software. Voice recognition is usually quite accurate but there are transcription errors that can and very often do occur. I apologize for any typographical errors that were not detected and corrected.     Ursula Alert, MD 9/13/201910:25 AM

## 2018-02-15 NOTE — Patient Instructions (Signed)
Hydroxyzine capsules or tablets What is this medicine? HYDROXYZINE (hye [place] i zeen) is an antihistamine. This medicine is used to treat allergy symptoms. It is also used to treat anxiety and tension. This medicine can be used with other medicines to induce sleep before surgery. This medicine may be used for other purposes; ask your health care provider or pharmacist if you have questions. COMMON BRAND NAME(S): ANX, Atarax, Rezine, Vistaril What should I tell my health care provider before I take this medicine? They need to know if you have any of these conditions: -any chronic illness -difficulty passing urine -glaucoma -heart disease -kidney disease -liver disease -lung disease -an unusual or allergic reaction to hydroxyzine, cetirizine, other medicines, foods, dyes, or preservatives -pregnant or trying to get pregnant -breast-feeding How should I use this medicine? Take this medicine by mouth with a full glass of water. Follow the directions on the prescription label. You may take this medicine with food or on an empty stomach. Take your medicine at regular intervals. Do not take your medicine more often than directed. Talk to your pediatrician regarding the use of this medicine in children. Special care may be needed. While this drug may be prescribed for children as young as 75 years of age for selected conditions, precautions do apply. Patients over 62 years old may have a stronger reaction and need a smaller dose. Overdosage: If you think you have taken too much of this medicine contact a poison control center or emergency room at once. NOTE: This medicine is only for you. Do not share this medicine with others. What if I miss a dose? If you miss a dose, take it as soon as you can. If it is almost time for your next dose, take only that dose. Do not take double or extra doses. What may interact with this medicine? -alcohol -barbiturate medicines for sleep or seizures -medicines for  colds, allergies -medicines for depression, anxiety, or emotional disturbances -medicines for pain -medicines for sleep -muscle relaxants This list may not describe all possible interactions. Give your health care provider a list of all the medicines, herbs, non-prescription drugs, or dietary supplements you use. Also tell them if you smoke, drink alcohol, or use illegal drugs. Some items may interact with your medicine. What should I watch for while using this medicine? Tell your doctor or health care professional if your symptoms do not improve. You may get drowsy or dizzy. Do not drive, use machinery, or do anything that needs mental alertness until you know how this medicine affects you. Do not stand or sit up quickly, especially if you are an older patient. This reduces the risk of dizzy or fainting spells. Alcohol may interfere with the effect of this medicine. Avoid alcoholic drinks. Your mouth may get dry. Chewing sugarless gum or sucking hard candy, and drinking plenty of water may help. Contact your doctor if the problem does not go away or is severe. This medicine may cause dry eyes and blurred vision. If you wear contact lenses you may feel some discomfort. Lubricating drops may help. See your eye doctor if the problem does not go away or is severe. If you are receiving skin tests for allergies, tell your doctor you are using this medicine. What side effects may I notice from receiving this medicine? Side effects that you should report to your doctor or health care professional as soon as possible: -fast or irregular heartbeat -difficulty passing urine -seizures -slurred speech or confusion -tremor Side effects that  usually do not require medical attention (report to your doctor or health care professional if they continue or are bothersome): -constipation -drowsiness -fatigue -headache -stomach upset This list may not describe all possible side effects. Call your doctor for  medical advice about side effects. You may report side effects to FDA at 1-800-FDA-1088. Where should I keep my medicine? Keep out of the reach of children. Store at room temperature between 15 and 30 degrees C (59 and 86 degrees F). Keep container tightly closed. Throw away any unused medicine after the expiration date. NOTE: This sheet is a summary. It may not cover all possible information. If you have questions about this medicine, talk to your doctor, pharmacist, or health care provider.  2018 Elsevier/Gold Standard (2007-10-04 14:50:59) Generalized Anxiety Disorder, Adult Generalized anxiety disorder (GAD) is a mental health disorder. People with this condition constantly worry about everyday events. Unlike normal anxiety, worry related to GAD is not triggered by a specific event. These worries also do not fade or get better with time. GAD interferes with life functions, including relationships, work, and school. GAD can vary from mild to severe. People with severe GAD can have intense waves of anxiety with physical symptoms (panic attacks). What are the causes? The exact cause of GAD is not known. What increases the risk? This condition is more likely to develop in:  Women.  People who have a family history of anxiety disorders.  People who are very shy.  People who experience very stressful life events, such as the death of a loved one.  People who have a very stressful family environment.  What are the signs or symptoms? People with GAD often worry excessively about many things in their lives, such as their health and family. They may also be overly concerned about:  Doing well at work.  Being on time.  Natural disasters.  Friendships.  Physical symptoms of GAD include:  Fatigue.  Muscle tension or having muscle twitches.  Trembling or feeling shaky.  Being easily startled.  Feeling like your heart is pounding or racing.  Feeling out of breath or like you  cannot take a deep breath.  Having trouble falling asleep or staying asleep.  Sweating.  Nausea, diarrhea, or irritable bowel syndrome (IBS).  Headaches.  Trouble concentrating or remembering facts.  Restlessness.  Irritability.  How is this diagnosed? Your health care provider can diagnose GAD based on your symptoms and medical history. You will also have a physical exam. The health care provider will ask specific questions about your symptoms, including how severe they are, when they started, and if they come and go. Your health care provider may ask you about your use of alcohol or drugs, including prescription medicines. Your health care provider may refer you to a mental health specialist for further evaluation. Your health care provider will do a thorough examination and may perform additional tests to rule out other possible causes of your symptoms. To be diagnosed with GAD, a person must have anxiety that:  Is out of his or her control.  Affects several different aspects of his or her life, such as work and relationships.  Causes distress that makes him or her unable to take part in normal activities.  Includes at least three physical symptoms of GAD, such as restlessness, fatigue, trouble concentrating, irritability, muscle tension, or sleep problems.  Before your health care provider can confirm a diagnosis of GAD, these symptoms must be present more days than they are not, and they  must last for six months or longer. How is this treated? The following therapies are usually used to treat GAD:  Medicine. Antidepressant medicine is usually prescribed for long-term daily control. Antianxiety medicines may be added in severe cases, especially when panic attacks occur.  Talk therapy (psychotherapy). Certain types of talk therapy can be helpful in treating GAD by providing support, education, and guidance. Options include: ? Cognitive behavioral therapy (CBT). People learn  coping skills and techniques to ease their anxiety. They learn to identify unrealistic or negative thoughts and behaviors and to replace them with positive ones. ? Acceptance and commitment therapy (ACT). This treatment teaches people how to be mindful as a way to cope with unwanted thoughts and feelings. ? Biofeedback. This process trains you to manage your body's response (physiological response) through breathing techniques and relaxation methods. You will work with a therapist while machines are used to monitor your physical symptoms.  Stress management techniques. These include yoga, meditation, and exercise.  A mental health specialist can help determine which treatment is best for you. Some people see improvement with one type of therapy. However, other people require a combination of therapies. Follow these instructions at home:  Take over-the-counter and prescription medicines only as told by your health care provider.  Try to maintain a normal routine.  Try to anticipate stressful situations and allow extra time to manage them.  Practice any stress management or self-calming techniques as taught by your health care provider.  Do not punish yourself for setbacks or for not making progress.  Try to recognize your accomplishments, even if they are small.  Keep all follow-up visits as told by your health care provider. This is important. Contact a health care provider if:  Your symptoms do not get better.  Your symptoms get worse.  You have signs of depression, such as: ? A persistently sad, cranky, or irritable mood. ? Loss of enjoyment in activities that used to bring you joy. ? Change in weight or eating. ? Changes in sleeping habits. ? Avoiding friends or family members. ? Loss of energy for normal tasks. ? Feelings of guilt or worthlessness. Get help right away if:  You have serious thoughts about hurting yourself or others. If you ever feel like you may hurt  yourself or others, or have thoughts about taking your own life, get help right away. You can go to your nearest emergency department or call:  Your local emergency services (911 in the [place]  A suicide crisis helpline, such as the [place] at (716)303-2051. This is open 24 hours a day.  Summary  Generalized anxiety disorder (GAD) is a mental health disorder that involves worry that is not triggered by a specific event.  People with GAD often worry excessively about many things in their lives, such as their health and family.  GAD may cause physical symptoms such as restlessness, trouble concentrating, sleep problems, frequent sweating, nausea, diarrhea, headaches, and trembling or muscle twitching.  A mental health specialist can help determine which treatment is best for you. Some people see improvement with one type of therapy. However, other people require a combination of therapies. This information is not intended to replace advice given to you by your health care provider. Make sure you discuss any questions you have with your health care provider. Document Released: 09/16/2012 Document Revised: 04/11/2016 Document Reviewed: 04/11/2016 Elsevier Interactive Patient Education  [employer].

## 2018-02-19 ENCOUNTER — Other Ambulatory Visit: Payer: Self-pay | Admitting: Psychiatry

## 2018-02-20 LAB — TSH: TSH: 2.22 u[IU]/mL (ref 0.450–4.500)

## 2018-02-21 ENCOUNTER — Encounter: Payer: Self-pay | Admitting: Licensed Clinical Social Worker

## 2018-02-21 ENCOUNTER — Ambulatory Visit: Payer: 59 | Admitting: Licensed Clinical Social Worker

## 2018-02-21 DIAGNOSIS — F411 Generalized anxiety disorder: Secondary | ICD-10-CM | POA: Diagnosis not present

## 2018-02-21 NOTE — Progress Notes (Signed)
Comprehensive Clinical Assessment (CCA) Note  02/21/2018 Ebony Lawson [id number]  Visit Diagnosis:      ICD-10-CM   1. GAD (generalized anxiety disorder) F41.1       CCA Part One  Part One has been completed on paper by the patient.  (See scanned document in Chart Review)  CCA Part Two A  Intake/Chief Complaint:  CCA Intake With Chief Complaint CCA Part Two Date: 02/21/18 CCA Part Two Time: 1230 Chief Complaint/Presenting Problem: "I have anxiety and stress."  Patients Currently Reported Symptoms/Problems: "I have a lot of extra worry and nervousness about a lot of different things. Sometimes it effects my sleep, and my thought processes."  Collateral Involvement: N/A Individual's Strengths: "I play the trumpet."  Individual's Preferences: N/A Individual's Abilities: good communication  Type of Services Patient Feels Are Needed: medication management, therapy 2x monthly Initial Clinical Notes/Concerns: none at this time.   Mental Health Symptoms Depression:  Depression: N/A  Mania:  Mania: N/A  Anxiety:   Anxiety: Fatigue, Irritability, Restlessness, Sleep, Tension, Worrying  Psychosis:  Psychosis: N/A  Trauma:  Trauma: N/A  Obsessions:  Obsessions: N/A  Compulsions:  Compulsions: N/A  Inattention:  Inattention: N/A  Hyperactivity/Impulsivity:  Hyperactivity/Impulsivity: N/A  Oppositional/Defiant Behaviors:  Oppositional/Defiant Behaviors: N/A  Borderline Personality:  Emotional Irregularity: N/A  Other Mood/Personality Symptoms:  Other Mood/Personality Symtpoms: Pt reports she is unsure if her sleep/fatigue problems are due to her work schedule or anxiety.    Mental Status Exam Appearance and self-care  Stature:  Stature: Small  Weight:  Weight: Thin  Clothing:  Clothing: Casual  Grooming:  Grooming: Normal  Cosmetic use:  Cosmetic Use: Age appropriate  Posture/gait:  Posture/Gait: Normal  Motor activity:  Motor Activity: Not Remarkable  Sensorium  Attention:   Attention: Normal  Concentration:  Concentration: Normal  Orientation:  Orientation: X5  Recall/memory:  Recall/Memory: Normal  Affect and Mood  Affect:  Affect: Appropriate  Mood:  Mood: Anxious  Relating  Eye contact:  Eye Contact: Normal  Facial expression:  Facial Expression: Anxious  Attitude toward examiner:  Attitude Toward Examiner: Cooperative  Thought and Language  Speech flow: Speech Flow: Normal  Thought content:  Thought Content: Appropriate to mood and circumstances  Preoccupation:  Preoccupations: (N/A)  Hallucinations:  Hallucinations: (N/A)  Organization:     Transport planner of Knowledge:  Fund of Knowledge: Average  Intelligence:  Intelligence: Average  Abstraction:  Abstraction: Normal  Judgement:  Judgement: Normal  Reality Testing:  Reality Testing: Realistic  Insight:  Insight: Good  Decision Making:  Decision Making: Normal  Social Functioning  Social Maturity:  Social Maturity: Responsible  Social Judgement:  Social Judgement: Normal  Stress  Stressors:  Stressors: Work  Coping Ability:  Coping Ability: Exhausted, English as a second language teacher Deficits:     Supports:      Family and Psychosocial History: Family history Marital status: Single Are you sexually active?: Yes What is your sexual orientation?: Heterosexual  Has your sexual activity been affected by drugs, alcohol, medication, or emotional stress?: "Yeah, a little bit--because even when I want to--I get ovelry paranoid that I might get pregnant."  Does patient have children?: No  Childhood History:  Childhood History By whom was/is the patient raised?: Both parents Additional childhood history information: None reported Description of patient's relationship with caregiver when they were a child: "It was good. I feel like the normal amounts of fights."  Patient's description of current relationship with people who raised him/her: "It's good. I visit  them pretty often and try to help them  out with things."  How were you disciplined when you got in trouble as a child/adolescent?: "time outs, spankings, that kind of thing."  Does patient have siblings?: Yes Number of Siblings: 1 Description of patient's current relationship with siblings: one brother, "pretty good relationship. We talk kind of often, but we're not besties or anything."  Did patient suffer any verbal/emotional/physical/sexual abuse as a child?: No Did patient suffer from severe childhood neglect?: No Has patient ever been sexually abused/assaulted/raped as an adolescent or adult?: No Was the patient ever a victim of a crime or a disaster?: No Witnessed domestic violence?: No Has patient been effected by domestic violence as an adult?: No  CCA Part Two B  Employment/Work Situation: Employment / Work Copywriter, advertising Employment situation: Employed Where is patient currently employed?: [employer] How long has patient been employed?: 6 years Patient's job has been impacted by current illness: No What is the longest time patient has a held a job?: current job Where was the patient employed at that time?: current job Did You Receive Any Psychiatric Treatment/Services While in Passenger transport manager?: (N/A) Are There Guns or Other Weapons in [place]?: No Are These Psychologist, educational?: (N/A)  Education: Education School Currently Attending: N/A Last Grade Completed: 12 Name of [place]: [employer] Did [employer] Graduate From [employer]?: Yes Did Physicist, medical?: Yes What Type of College Degree Do you Have?: Manufacturing engineer of Science Did Heritage manager?: No What Was Your Major?: Biology  Did You Have Any Special Interests In School?: "I was always in band."  Did You Have An Individualized Education Program (IIEP): No Did You Have Any Difficulty At School?: No  Religion: Religion/Spirituality Are You A Religious Person?: Yes What is Your Religious Affiliation?: [place] How Might This Affect  Treatment?: N/A  Leisure/Recreation: Leisure / Recreation Leisure and Hobbies: "I like to go hiking. I like to watch TV. I like to read books."   Exercise/Diet: Exercise/Diet Do You Exercise?: Yes What Type of Exercise Do You Do?: Other (Comment)(strength and conditioning ) How Many Times a Week Do You Exercise?: 4-5 times a week Have You Gained or Lost A Significant Amount of Weight in the Past Six Months?: No Do You Follow a Special Diet?: No Do You Have Any Trouble Sleeping?: Yes Explanation of Sleeping Difficulties: "I usually have trouble falling asleep. It takes me a while. I think about a lot of things as I'm falling asleep."   CCA Part Two C  Alcohol/Drug Use: Alcohol / Drug Use Pain Medications: N/A Prescriptions: Loryna, Vistaril Over the Counter: N/A History of alcohol / drug use?: No history of alcohol / drug abuse                      CCA Part Three  ASAM's:  Six Dimensions of Multidimensional Assessment  Dimension 1:  Acute Intoxication and/or Withdrawal Potential:     Dimension 2:  Biomedical Conditions and Complications:     Dimension 3:  Emotional, Behavioral, or Cognitive Conditions and Complications:     Dimension 4:  Readiness to Change:     Dimension 5:  Relapse, Continued use, or Continued Problem Potential:     Dimension 6:  Recovery/Living Environment:      Substance use Disorder (SUD)    Social Function:  Social Functioning Social Maturity: Responsible Social Judgement: Normal  Stress:  Stress Stressors: Work Coping Ability: Exhausted, Overwhelmed Patient Takes  Medications The Way The Doctor Instructed?: Yes Priority Risk: Low Acuity  Risk Assessment- Self-Harm Potential: Risk Assessment For Self-Harm Potential Thoughts of Self-Harm: No current thoughts Method: No plan Availability of Means: No access/NA Additional Comments for Self-Harm Potential: N/A  Risk Assessment -Dangerous to Others Potential: Risk Assessment For  Dangerous to Others Potential Method: No Plan Availability of Means: No access or NA Intent: Vague intent or NA Notification Required: No need or identified person Additional Information for Danger to Others Potential: (N/A) Additional Comments for Danger to Others Potential: N/A  DSM5 Diagnoses: Patient Active Problem List   Diagnosis Date Noted  . Cervical low risk human papillomavirus (HPV) DNA test positive 09/07/2017  . Generalized anxiety disorder 09/07/2017  . Routine cervical smear 12/09/2013  . BCP (birth control pills) initiation 11/18/2013    Patient Centered Plan: Patient is on the following Treatment Plan(s):  Anxiety  Recommendations for Services/Supports/Treatments: Recommendations for Services/Supports/Treatments Recommendations For Services/Supports/Treatments: Medication Management, Individual Therapy  Treatment Plan Summary: Ieasha spoke openly about her anxiety, and how it is affecting her life. She reported she has difficulty making decisions, and used the example of buying a shower curtain to illustrate how difficult it is for her to make a decision. She also reported worrying about her parents, and stated she is not able to control her anxiety. She states she has not previously been in therapy, and does not take medications for anxiety. We discussed CBT and the ideas behind it. I provided Breeze with a thought record to complete before her next session.     Referrals to Alternative Service(s): Referred to Alternative Service(s):   Place:   Date:   Time:    Referred to Alternative Service(s):   Place:   Date:   Time:    Referred to Alternative Service(s):   Place:   Date:   Time:    Referred to Alternative Service(s):   Place:   Date:   Time:     Alden Hipp, LCSW

## 2018-03-06 ENCOUNTER — Encounter: Payer: Self-pay | Admitting: Licensed Clinical Social Worker

## 2018-03-06 ENCOUNTER — Ambulatory Visit: Payer: 59 | Admitting: Licensed Clinical Social Worker

## 2018-03-06 DIAGNOSIS — F411 Generalized anxiety disorder: Secondary | ICD-10-CM | POA: Diagnosis not present

## 2018-03-06 NOTE — Progress Notes (Signed)
   THERAPIST PROGRESS NOTE  Session Time: 1000-1100  Participation Level: Active  Behavioral Response: CasualAlertAnxious  Type of Therapy: Individual Therapy  Treatment Goals addressed: Anxiety  Interventions: CBT  Summary: Ebony Lawson is a 32 y.o. female who presents with anxiety symptoms. Ebony Lawson reports her anxiety has been high but manageable. She reports she got a cat recently who she later found out was pregnant--which has caused significant anxiety for her, primarily rooted in the unknown. She brought her thought record in completed, and we reviewed the items she'd written about on her thought record. She wrote about her cat, her parents, her relationship, and her social anxiety. We worked through each section of the though record. She stated utilizing the thought record was helpful for more specific anxieties but not as much for more broad worries.   Suicidal/Homicidal: No  Therapist Response: [name] spoke openly about her anxiety and what she believes is the cause of it. She will continue to utilize a thought record in order to manage her anxiety.   Plan: Return again in 2 weeks.  Diagnosis: Axis I: Generalized Anxiety Disorder    Axis II: No diagnosis    Alden Hipp, LCSW 03/06/2018

## 2018-03-12 ENCOUNTER — Ambulatory Visit: Payer: 59 | Admitting: Psychiatry

## 2018-03-12 ENCOUNTER — Encounter: Payer: Self-pay | Admitting: Psychiatry

## 2018-03-12 ENCOUNTER — Other Ambulatory Visit: Payer: Self-pay

## 2018-03-12 VITALS — BP 94/64 | HR 66 | Temp 98.2°F | Wt 100.8 lb

## 2018-03-12 DIAGNOSIS — F411 Generalized anxiety disorder: Secondary | ICD-10-CM | POA: Diagnosis not present

## 2018-03-12 DIAGNOSIS — F5105 Insomnia due to other mental disorder: Secondary | ICD-10-CM | POA: Diagnosis not present

## 2018-03-12 MED ORDER — CLONAZEPAM 0.25 MG PO TBDP: 0.2500 mg | ORAL_TABLET | ORAL | 0 refills | Status: DC

## 2018-03-12 NOTE — Patient Instructions (Signed)
Clonazepam orally disintegrating tablets (wafers) What is this medicine? CLONAZEPAM (kloe NA ze pam) is a benzodiazepine. It is used to treat certain types of seizures. It is also used to treat Panic Disorder. This medicine may be used for other purposes; ask your health care provider or pharmacist if you have questions. COMMON BRAND NAME(S): Klonopin What should I tell my health care provider before I take this medicine? They need to know if you have any of these conditions: -an alcohol or drug abuse problem -bipolar disorder, depression, psychosis or other mental health condition -glaucoma -kidney or liver disease -lung or breathing disease -myasthenia gravis -Parkinson's disease -porphyria -seizures or a history of seizures -suicidal thoughts -an unusual or allergic reaction to clonazepam, other benzodiazepines, foods, dyes, or preservatives -pregnant or trying to get pregnant -breast-feeding How should I use this medicine? Take this medicine by mouth. Follow the directions on the prescription label. Place the wafer on your tongue and it will slowly dissolve. You do not need to swallow the wafer with water, but you may take it with water if you like. If it upsets your stomach, take it with food or milk. Take your doses at regular intervals. Do not take your medicine more often than directed. Do not stop taking or change the dose except on the advice of your doctor or health care professional. A special MedGuide will be given to you by the pharmacist with each prescription and refill. Be sure to read this information carefully each time. Talk to your pediatrician regarding the use of this medicine in children. Special care may be needed. Overdosage: If you think you have taken too much of this medicine contact a poison control center or emergency room at once. NOTE: This medicine is only for you. Do not share this medicine with others. What if I miss a dose? If you miss a dose, take it  as soon as you can. If it is almost time for your next dose, take only that dose. Do not take double or extra doses. What may interact with this medicine? Do not take this medication with any of the following medicines: -narcotic medicines for cough -sodium oxybate This medicine may also interact with the following medications: -alcohol -antihistamines for allergy, cough and cold -antiviral medicines for HIV or AIDS -certain medicines for anxiety or sleep -certain medicines for depression, like amitriptyline, fluoxetine, sertraline -certain medicines for fungal infections like ketoconazole and itraconazole -certain medicines for seizures like carbamazepine, phenobarbital, phenytoin, primidone -general anesthetics like halothane, isoflurane, methoxyflurane, propofol -local anesthetics like lidocaine, pramoxine, tetracaine -medicines that relax muscles for surgery -narcotic medicines for pain -phenothiazines like chlorpromazine, mesoridazine, prochlorperazine, thioridazine This list may not describe all possible interactions. Give your health care provider a list of all the medicines, herbs, non-prescription drugs, or dietary supplements you use. Also tell them if you smoke, drink alcohol, or use illegal drugs. Some items may interact with your medicine. What should I watch for while using this medicine? Tell your doctor or health care professional if your symptoms do not start to get better or if they get worse. Do not stop taking except on your doctor's advice. You may develop a severe reaction. Your doctor will tell you how much medicine to take. You may get drowsy or dizzy. Do not drive, use machinery, or do anything that needs mental alertness until you know how this medicine affects you. To reduce the risk of dizzy and fainting spells, do not stand or sit up quickly, especially if  you are an older patient. Alcohol may increase dizziness and drowsiness. Avoid alcoholic drinks. If you are  taking another medicine that also causes drowsiness, you may have more side effects. Give your health care provider a list of all medicines you use. Your doctor will tell you how much medicine to take. Do not take more medicine than directed. Call emergency for help if you have problems breathing or unusual sleepiness. The use of this medicine may increase the chance of suicidal thoughts or actions. Pay special attention to how you are responding while on this medicine. Any worsening of mood, or thoughts of suicide or dying should be reported to your health care professional right away. What side effects may I notice from receiving this medicine? Side effects that you should report to your doctor or health care professional as soon as possible: -allergic reactions like skin rash, itching or hives, swelling of the face, lips, or tongue -breathing problems -confusion -loss of balance or coordination -signs and symptoms of low blood pressure like dizziness; feeling faint or lightheaded, falls; unusually weak or tired -suicidal thoughts or mood changes Side effects that usually do not require medical attention (report to your doctor or health care professional if they continue or are bothersome): -dizziness -headache -tiredness -upset stomach This list may not describe all possible side effects. Call your doctor for medical advice about side effects. You may report side effects to FDA at 1-800-FDA-1088. Where should I keep my medicine? Keep out of the reach of children. This medicine can be abused. Keep your medicine in a safe place to protect it from theft. Do not share this medicine with anyone. Selling or giving away this medicine is dangerous and against the law. Store at room temperature between 15 and 30 degrees C (59 and 86 degrees F). Protect from light and moisture. Keep container tightly closed. NOTE: This sheet is a summary. It may not cover all possible information. If you have questions  about this medicine, talk to your doctor, pharmacist, or health care provider.  2018 Elsevier/Gold Standard (2015-10-29 18:48:02)

## 2018-03-12 NOTE — Progress Notes (Signed)
[facility] OP Progress Note  03/12/2018 10:47 AM Ebony Lawson  MRN:  [id number]  Chief Complaint: ' I am here for follow up.' Chief Complaint    Follow-up; Medication Refill     HPI: Ebony Lawson is a 32 yr old Caucasian female, single, employed, lives in [place], has a history of anxiety symptoms, sleep problems, presented to the clinic today for a follow-up visit.  Patient today reports she continues to struggle with anxiety symptoms.  She worries a lot about different things and has trouble relaxing.  She reports she tried the hydroxyzine 3-4 times since her last visit here.  She reports the hydroxyzine makes her drowsy.  She hence may not be able to take it while she is at work.  She wonders if there is another medication that she can try for anxiety symptoms.  She reports she does not want to be on a scheduled medication.  Her blood pressure and pulse rate today reviewed as borderline low.  Discussed with her that propranolol hence may not be the right option.  Discussed giving her Klonopin as needed only for severe anxiety symptoms.  Discussed with her that its habit forming and she needs to limit use as much as possible.  She agrees with plan.  Patient continues to struggle with sleep problems because of her shift changes at work.  She reports she takes melatonin which helps to some extent.  Discussed with her she could also take hydroxyzine if she wakes up in the middle of the night.  She will try the same.  She will continue psychotherapy visits with Merleen Nicely our therapist.  She denies any other concerns today. Visit Diagnosis:    ICD-10-CM   1. GAD (generalized anxiety disorder) F41.1   2. Insomnia due to mental condition F51.05     Past Psychiatric History: Reviewed past psychiatric history from my progress note on 02/15/2018.  Past Medical History:  Past Medical History:  Diagnosis Date  . Heart murmur   . Migraine    History reviewed. No pertinent surgical history.  Family  Psychiatric History: Reviewed family psychiatric history from my progress note on 02/15/2018  Family History:  Family History  Problem Relation Age of Onset  . Hyperlipidemia Mother   . Cancer Father        prostate and skin cancer  . Hyperlipidemia Father   . Heart disease Father        CAD - triple bypass 2014  . Hypertension Father   . Hearing loss Paternal Grandfather   . Hypertension Paternal Grandfather   . Anxiety disorder Brother     Social History: Have reviewed social history from my progress note on 02/15/2018. Social History   Socioeconomic History  . Marital status: Single    Spouse name: Not on file  . Number of children: 0  . Years of education: 16  . Highest education level: Bachelor's degree (e.g., BA, AB, BS)  Occupational History  . Occupation: DNA Animal nutritionist: [employer]  . Financial resource strain: Not hard at all  . Food insecurity:    Worry: Never true    Inability: Never true  . Transportation needs:    Medical: No    Non-medical: No  Tobacco Use  . Smoking status: Never Smoker  . Smokeless tobacco: Never Used  Substance and Sexual Activity  . Alcohol use: Not Currently    Alcohol/week: 0.0 standard drinks    Comment: 2-3 drinks monthly  .  Drug use: No  . Sexual activity: Yes    Partners: Male    Birth control/protection: OCP    Comment: 1 partner   Lifestyle  . Physical activity:    Days per week: 4 days    Minutes per session: 50 min  . Stress: Very much  Relationships  . Social connections:    Talks on phone: Not on file    Gets together: Not on file    Attends religious service: More than 4 times per year    Active member of club or organization: Yes    Attends meetings of clubs or organizations: More than 4 times per year    Relationship status: Never married  Other Topics Concern  . Not on file  Social History Narrative   Icesis grew up outside of [facility]. 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   Exercise - none at present   Caffeine - rare     Allergies: No Known Allergies  Metabolic Disorder Labs: Lab Results  Component Value Date   HGBA1C 5.2 12/31/2014   No results found for: PROLACTIN Lab Results  Component Value Date   CHOL 163 12/31/2014   TRIG 69 12/31/2014   HDL 47 12/31/2014   LDLCALC 102 12/31/2014   LDLCALC 56 12/10/2013   Lab Results  Component Value Date   TSH 2.220 02/19/2018   TSH 2.49 12/31/2014    Therapeutic Level Labs: No results found for: LITHIUM No results found for: VALPROATE No components found for:  CBMZ  Current Medications: Current Outpatient Medications  Medication Sig Dispense Refill  . Adapalene 0.3 % gel Apply topically at bedtime.    . Calcium Carbonate-Vitamin D (CALCIUM 600+D3 PO) Take 1 tablet by mouth daily.    . drospirenone-ethinyl estradiol (YAZ,GIANVI,LORYNA) 3-0.02 MG tablet take 1 tablet by mouth daily 28 tablet 5  . fluconazole (DIFLUCAN) 150 MG tablet Take 1 tablet (150 mg total) by mouth once. 1 tablet 0  . hydrOXYzine (ATARAX/VISTARIL) 10 MG tablet Take 1-2 tablets (10-20 mg total) by mouth 2 (two) times daily as needed. For severe anxiety 100 tablet 0  . Melatonin 10 MG TABS Take 1 tablet by mouth at bedtime as needed.    . Pediatric Multivit-Minerals-C (COMPLETE MULTI-VITAMIN) CHEW Chew 1 tablet by mouth daily. Gummy vitamin    . tretinoin (RETIN-A) 0.025 % cream     . clonazePAM (KLONOPIN) 0.25 MG disintegrating tablet Take 1 tablet (0.25 mg total) by mouth as directed. Use one tablet as needed for severe anxiety symptoms 5 tablet 0   No current facility-administered medications for this visit.      Musculoskeletal: Strength & Muscle Tone: within normal limits Gait & Station: normal Patient leans: N/A  Psychiatric Specialty Exam: Review of Systems  Psychiatric/Behavioral: The patient is  nervous/anxious.   All other systems reviewed and are negative.   Blood pressure 94/64, pulse 66, temperature 98.2 F (36.8 C), temperature source Oral, weight 100 lb 12.8 oz (45.7 kg), last menstrual period 02/15/2018.Body mass index is 19.69 kg/m.  General Appearance: Casual  Eye Contact:  Fair  Speech:  Normal Rate  Volume:  Normal  Mood:  Anxious  Affect:  Appropriate  Thought Process:  Goal Directed and Descriptions of Associations: Intact  Orientation:  Full (Time, Place, and Person)  Thought Content: Logical   Suicidal Thoughts:  No  Homicidal Thoughts:  No  Memory:  Immediate;   Fair Recent;   Fair Remote;   Fair  Judgement:  Fair  Insight:  Fair  Psychomotor Activity:  Normal  Concentration:  Concentration: Fair and Attention Span: Fair  Recall:  [employer] of Knowledge: Fair  Language: Fair  Akathisia:  No  Handed:  Right  AIMS (if indicated): na  Assets:  Communication Skills Desire for Improvement Social Support  ADL's:  Intact  Cognition: WNL  Sleep:  Fair   Screenings: PHQ2-9     Office Visit from 09/06/2017 in [facility]  PHQ-2 Total Score  0       Assessment and Plan: Ebony Lawson is a 32 year old Caucasian female, single, employed, lives in [place], has a history of anxiety symptoms, sleep problems, presented to the clinic today for a follow-up visit.  She is biologically predisposed given her family history of mental health problems.  She also has psychosocial stressors of her work as well as her shift at work which affects her sleep.  She will continue psychotherapy visits.  She is not interested in an SSRI or any other scheduled medication.  She wants to continue as needed medication for anxiety symptoms for now.  Plan as noted below.   Plan Generalized anxiety disorder Continue CBT Continue hydroxyzine 10 mg as needed. We will also give her Klonopin 0.25 mg for severe anxiety symptoms. Discussed with patient to limit use.  Will  only give her 5 pills.  She is aware about the risk of being on long-term benzodiazepine therapy.   For insomnia Continue melatonin. Discussed using hydroxyzine if she wakes up to help her return to sleep.  I have reviewed TSH done on 02/19/2018 - wnl.  Follow-up in clinic in 3-4 weeks or sooner if needed.  More than 50 % of the time was spent for psychoeducation and supportive psychotherapy and care coordination.  This note was generated in part or whole with voice recognition software. Voice recognition is usually quite accurate but there are transcription errors that can and very often do occur. I apologize for any typographical errors that were not detected and corrected.        Ursula Alert, MD 03/12/2018, 10:47 AM

## 2018-03-20 ENCOUNTER — Encounter: Payer: Self-pay | Admitting: Licensed Clinical Social Worker

## 2018-03-20 ENCOUNTER — Ambulatory Visit: Payer: 59 | Admitting: Licensed Clinical Social Worker

## 2018-03-20 DIAGNOSIS — F411 Generalized anxiety disorder: Secondary | ICD-10-CM | POA: Diagnosis not present
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
## 2018-03-21 ENCOUNTER — Encounter: Payer: Self-pay | Admitting: Nurse Practitioner

## 2018-03-21 ENCOUNTER — Ambulatory Visit (INDEPENDENT_AMBULATORY_CARE_PROVIDER_SITE_OTHER): Payer: Managed Care, Other (non HMO) | Admitting: Nurse Practitioner

## 2018-03-21 VITALS — BP 114/69 | HR 79 | Resp 16 | Ht 60.0 in | Wt 103.6 lb

## 2018-03-21 DIAGNOSIS — G8929 Other chronic pain: Secondary | ICD-10-CM | POA: Insufficient documentation

## 2018-03-21 DIAGNOSIS — M25561 Pain in right knee: Secondary | ICD-10-CM | POA: Diagnosis not present

## 2018-03-21 DIAGNOSIS — Z0001 Encounter for general adult medical examination with abnormal findings: Secondary | ICD-10-CM | POA: Diagnosis not present

## 2018-03-21 DIAGNOSIS — L659 Nonscarring hair loss, unspecified: Secondary | ICD-10-CM | POA: Diagnosis not present

## 2018-03-21 DIAGNOSIS — Z30011 Encounter for initial prescription of contraceptive pills: Secondary | ICD-10-CM

## 2018-03-21 DIAGNOSIS — Z124 Encounter for screening for malignant neoplasm of cervix: Secondary | ICD-10-CM

## 2018-03-21 DIAGNOSIS — M25562 Pain in left knee: Secondary | ICD-10-CM

## 2018-03-21 DIAGNOSIS — R300 Dysuria: Secondary | ICD-10-CM | POA: Insufficient documentation

## 2018-03-21 MED ORDER — DROSPIRENONE-ETHINYL ESTRADIOL 3-0.02 MG PO TABS: 1.0000 | ORAL_TABLET | Freq: Every day | ORAL | 11 refills | Status: DC

## 2018-03-21 NOTE — Progress Notes (Signed)
[facility], [place] [address]  Internal MEDICINE  Office Visit Note  Patient Name: Ebony Lawson  [id number]  [id number]  Date of Service: 03/21/2018   Pt is here for routine health maintenance examination  Chief Complaint  Patient presents with  . Annual Exam    pt concerned about having more than normal hair loss,   . Gynecologic Exam  . Pain    knee pain during exercise activities both knees sometimes more in the right knee     The patient is c/o hair loss. States that she has always had some hair loss, but lately, she has noted this more frequently and more severely. She has no noted increase in her stress levels. She does work third shift, but has been working third shift for several years. No changes in appetite or bowel habits. She has no known history of thyroid issues in her family.  States that she is having more knee pain than she has ever had in her life. Pain is in both knees, however, she feels it worse in the right knee. She is also doing more exercise and working out more that she ever has. Doing a lot of hiking. Going down hill seems to make this worse.  The patient has had abnormal pap smears in the past. Most recently, she did see GYN provider and had colposcopy done, removing abnormal tissue. Was told it was ok to have next CPE with pap pe primary care. Will follow up as indicated.     Current Medication: Outpatient Encounter Medications as of 03/21/2018  Medication Sig  . Adapalene 0.3 % gel Apply topically at bedtime.  . Calcium Carbonate-Vitamin D (CALCIUM 600+D3 PO) Take 1 tablet by mouth daily.  . clonazePAM (KLONOPIN) 0.25 MG disintegrating tablet Take 1 tablet (0.25 mg total) by mouth as directed. Use one tablet as needed for severe anxiety symptoms  . drospirenone-ethinyl estradiol (YAZ,GIANVI,LORYNA) 3-0.02 MG tablet Take 1 tablet by mouth daily.  . fluconazole (DIFLUCAN) 150 MG tablet Take 1 tablet (150 mg total)  by mouth once.  . hydrOXYzine (ATARAX/VISTARIL) 10 MG tablet Take 1-2 tablets (10-20 mg total) by mouth 2 (two) times daily as needed. For severe anxiety  . Melatonin 10 MG TABS Take 1 tablet by mouth at bedtime as needed.  . Pediatric Multivit-Minerals-C (COMPLETE MULTI-VITAMIN) CHEW Chew 1 tablet by mouth daily. Gummy vitamin  . tretinoin (RETIN-A) 0.025 % cream   . [DISCONTINUED] drospirenone-ethinyl estradiol (YAZ,GIANVI,LORYNA) 3-0.02 MG tablet take 1 tablet by mouth daily   No facility-administered encounter medications on file as of 03/21/2018.     Surgical History: History reviewed. No pertinent surgical history.  Medical History: Past Medical History:  Diagnosis Date  . Heart murmur   . Migraine     Family History: Family History  Problem Relation Age of Onset  . Hyperlipidemia Mother   . Cancer Father        prostate and skin cancer  . Hyperlipidemia Father   . Heart disease Father        CAD - triple bypass 2014  . Hypertension Father   . Hearing loss Paternal Grandfather   . Hypertension Paternal Grandfather   . Anxiety disorder Brother       Review of Systems  Constitutional: Negative for activity change, chills, fatigue and unexpected weight change.  HENT: Negative for congestion, postnasal drip, rhinorrhea, sneezing and sore throat.   Eyes: Negative.  Negative for redness.  Respiratory: Negative for cough,  chest tightness, shortness of breath and wheezing.   Cardiovascular: Negative for chest pain and palpitations.  Gastrointestinal: Negative for abdominal pain, constipation, diarrhea, nausea and vomiting.  Endocrine: Negative for cold intolerance, heat intolerance, polydipsia, polyphagia and polyuria.       Increased hair loss.  Genitourinary: Negative.  Negative for dysuria and frequency.       Would like to restart oral contraceptives. Helping regulate cycles and helping facial acne.  Musculoskeletal: Positive for arthralgias. Negative for back pain,  joint swelling and neck pain.       Knee pain.  Skin: Negative for rash.       Facial acne  Allergic/Immunologic: Negative for environmental allergies.  Neurological: Negative for dizziness, tremors, numbness and headaches.  Hematological: Negative for adenopathy. Does not bruise/bleed easily.  Psychiatric/Behavioral: Positive for dysphoric mood. Negative for behavioral problems (Depression), sleep disturbance and suicidal ideas. The patient is not nervous/anxious.        Se[facility] psychiatry and counseling at [facility]     Vital Signs: BP 114/69 (BP Location: Left Arm, Patient Position: Sitting, Cuff Size: Normal)   Pulse 79   Resp 16   Ht 5' (1.524 m)   Wt 103 lb 9.6 oz (47 kg)   SpO2 100%   BMI 20.23 kg/m    Physical Exam  Constitutional: She is oriented to person, place, and time. She appears well-developed and well-nourished. No distress.  HENT:  Head: Normocephalic and atraumatic.  Nose: Nose normal.  Mouth/Throat: Oropharynx is clear and moist. No oropharyngeal exudate.  Eyes: Pupils are equal, round, and reactive to light. Conjunctivae and EOM are normal.  Neck: Normal range of motion. Neck supple. No JVD present. No tracheal deviation present. No thyromegaly present.  Cardiovascular: Normal rate, regular rhythm, normal heart sounds and intact distal pulses. Exam reveals no gallop and no friction rub.  No murmur heard. Pulmonary/Chest: Effort normal and breath sounds normal. No respiratory distress. She has no wheezes. She has no rales. She exhibits no tenderness. Right breast exhibits no inverted nipple, no mass, no nipple discharge, no skin change and no tenderness. Left breast exhibits no inverted nipple, no mass, no nipple discharge, no skin change and no tenderness.  Abdominal: Soft. Bowel sounds are normal. There is no tenderness.  Genitourinary: Vagina normal and uterus normal.  Genitourinary Comments: No tenderness, masses, or organomeglay present during bimanual exam .   Musculoskeletal: Normal range of motion.  Lymphadenopathy:    She has no cervical adenopathy.  Neurological: She is alert and oriented to person, place, and time. No cranial nerve deficit.  Skin: Skin is warm and dry. Capillary refill takes less than 2 seconds. She is not diaphoretic.  Mild/moderate facial acne   Psychiatric: She has a normal mood and affect. Her behavior is normal. Judgment and thought content normal.  Nursing note and vitals reviewed.    LABS: Recent Results (from the past 2160 hour(s))  TSH     Status: None   Collection Time: 02/19/18  9:29 AM  Result Value Ref Range   TSH 2.220 0.450 - 4.500 uIU/mL    Assessment/Plan: 1. Encounter for general adult medical examination with abnormal findings Annual health maintenance exam with pap smear today  2. Alopecia Check full thyroid and anemia panels. Recommended use of OTC biotin every day.  3. Chronic pain of both knees Recommended use of ice and elevation after exertion. Advised she take OTC aleve or ibuprofen to help relieve pain and inflammation. Consider using a knee sleeve to  provide additional support.   4. Screening for malignant neoplasm of cervix - Pap IG and HPV (high risk) DNA detection  5. Encounter for prescription of oral contraceptives Restart OCP - drospirenone-ethinyl estradiol (YAZ,GIANVI,LORYNA) 3-0.02 MG tablet; Take 1 tablet by mouth daily.  Dispense: 1 Package; Refill: 11  6. Dysuria - UA/M w/rflx Culture, Routine  General Counseling: Camry verbalizes understanding of the findings of todays visit and agrees with plan of treatment. I have discussed any further diagnostic evaluation that may be needed or ordered today. We also reviewed her medications today. she has been encouraged to call the office with any questions or concerns that should arise related to todays visit.    Counseling:  This patient was seen by Leretha Pol FNP Collaboration with Dr Lavera Guise as a part of  collaborative care agreement  Orders Placed This Encounter  Procedures  . UA/M w/rflx Culture, Routine    Meds ordered this encounter  Medications  . drospirenone-ethinyl estradiol (YAZ,GIANVI,LORYNA) 3-0.02 MG tablet    Sig: Take 1 tablet by mouth daily.    Dispense:  1 Package    Refill:  11    Order Specific Question:   Supervising Provider    Answer:   Lavera Guise [[id number]]    Time spent: [place], MD  Internal Medicine

## 2018-03-24 LAB — UA/M W/RFLX CULTURE, ROUTINE
BILIRUBIN UA: NEGATIVE
GLUCOSE, UA: NEGATIVE
KETONES UA: NEGATIVE
NITRITE UA: NEGATIVE
PROTEIN UA: NEGATIVE
SPEC GRAV UA: 1.006 (ref 1.005–1.030)
UUROB: 0.2 mg/dL (ref 0.2–1.0)
pH, UA: 7.5 (ref 5.0–7.5)

## 2018-03-24 LAB — MICROSCOPIC EXAMINATION
Bacteria, UA: NONE SEEN
Casts: NONE SEEN /lpf

## 2018-03-24 LAB — URINE CULTURE, REFLEX

## 2018-03-25 ENCOUNTER — Other Ambulatory Visit: Payer: Self-pay | Admitting: Nurse Practitioner

## 2018-03-25 DIAGNOSIS — N390 Urinary tract infection, site not specified: Secondary | ICD-10-CM

## 2018-03-25 MED ORDER — SULFAMETHOXAZOLE-TRIMETHOPRIM 800-160 MG PO TABS: 1.0000 | ORAL_TABLET | Freq: Two times a day (BID) | ORAL | 0 refills | Status: DC

## 2018-03-25 NOTE — Progress Notes (Signed)
Urine from physical did show presence of uti. I have added bactrim DS twice daily for the next 5 days. Sent this to [facility] for her.

## 2018-03-26 LAB — PAP IG AND HPV HIGH-RISK
HPV, HIGH-RISK: POSITIVE — AB
PAP SMEAR COMMENT: 0

## 2018-04-03 ENCOUNTER — Ambulatory Visit: Payer: 59 | Admitting: Licensed Clinical Social Worker

## 2018-04-03 ENCOUNTER — Other Ambulatory Visit: Payer: Self-pay | Admitting: Nurse Practitioner

## 2018-04-03 ENCOUNTER — Encounter: Payer: Self-pay | Admitting: Licensed Clinical Social Worker

## 2018-04-03 DIAGNOSIS — F411 Generalized anxiety disorder: Secondary | ICD-10-CM

## 2018-04-03 NOTE — Progress Notes (Signed)
   THERAPIST PROGRESS NOTE  Session Time: 1000  Participation Level: Active  Behavioral Response: CasualAlertAnxious  Type of Therapy: Individual Therapy  Treatment Goals addressed: Anxiety  Interventions: CBT  Summary: Ebony Lawson is a 32 y.o. female who presents with anxiety. [name] reports she has been doing well over the last two weeks. She reports continued anxiety related to her cat, and not knowing if she is pregnant or not. [name] reports she has prepared for kittens by buying things she may need in the event of an emergency, and reading all she can online about the subject. We discussed the idea of controlling what you have control over, which she has done well by preparing herself/her house for potential kittens. She also made her cat a veterinary appointment to obtain further information. [name] reports decreased anxiety around her relationship, but states she wishes they were able to spend more time together but are not able to due to work. [name] reported a sudden "burst of anxiety for no reason while at work and when I first wake up or am trying to go to bed." We discussed methods of distraction and relaxation for anxiety that does not have an obvious trigger. Additionally, we discussed the idea that not processing or expressing other emotions could contribute to anxiety that seemingly has no other cause.    Suicidal/Homicidal: No  Therapist Response: [name] is able to speak openly while in session, and is able to discuss her anxiety in clear terms. She was in agreement with attempting progressive muscle relaxation in moments when she has anxiety symptoms which don't appear to have a known trigger. We discussed the idea of progressive muscle relaxation, and reviewed the method together. Additionally, she was open to utilizing distraction toys while at work and feeling anxious during monotonous tasks. We will continue to utilize CBT and DBT to manage anxiety symptoms moving forward on a  bi-weekly basis.   Plan: Return again in 2 weeks.  Diagnosis: Axis I: Generalized Anxiety Disorder    Axis II: No diagnosis    Alden Hipp, LCSW 04/03/2018

## 2018-04-04 LAB — COMPREHENSIVE METABOLIC PANEL
A/G RATIO: 1.7 (ref 1.2–2.2)
ALK PHOS: 32 IU/L — AB (ref 39–117)
ALT: 7 IU/L (ref 0–32)
AST: 12 IU/L (ref 0–40)
Albumin: 4.2 g/dL (ref 3.5–5.5)
BILIRUBIN TOTAL: 0.2 mg/dL (ref 0.0–1.2)
BUN / CREAT RATIO: 20 (ref 9–23)
BUN: 12 mg/dL (ref 6–20)
CHLORIDE: 104 mmol/L (ref 96–106)
CO2: 19 mmol/L — ABNORMAL LOW (ref 20–29)
Calcium: 8.8 mg/dL (ref 8.7–10.2)
Creatinine, Ser: 0.61 mg/dL (ref 0.57–1.00)
GFR calc Af Amer: 141 mL/min/{1.73_m2} (ref >59)
GFR calc non Af Amer: 122 mL/min/{1.73_m2} (ref >59)
Globulin, Total: 2.5 g/dL (ref 1.5–4.5)
Glucose: 81 mg/dL (ref 65–99)
POTASSIUM: 4.6 mmol/L (ref 3.5–5.2)
Sodium: 136 mmol/L (ref 134–144)
Total Protein: 6.7 g/dL (ref 6.0–8.5)

## 2018-04-04 LAB — LIPID PANEL W/O CHOL/HDL RATIO
Cholesterol, Total: 139 mg/dL (ref 100–199)
HDL: 50 mg/dL (ref >39)
LDL Calculated: 70 mg/dL (ref 0–99)
TRIGLYCERIDES: 94 mg/dL (ref 0–149)
VLDL CHOLESTEROL CAL: 19 mg/dL (ref 5–40)

## 2018-04-04 LAB — B12 AND FOLATE PANEL
Folate: 12.9 ng/mL (ref >3.0)
VITAMIN B 12: 345 pg/mL (ref 232–1245)

## 2018-04-04 LAB — VITAMIN D 25 HYDROXY (VIT D DEFICIENCY, FRACTURES): Vit D, 25-Hydroxy: 21.7 ng/mL — ABNORMAL LOW (ref 30.0–100.0)

## 2018-04-04 LAB — FERRITIN: Ferritin: 11 ng/mL — ABNORMAL LOW (ref 15–150)

## 2018-04-04 LAB — CBC
HEMATOCRIT: 37.3 % (ref 34.0–46.6)
Hemoglobin: 12.3 g/dL (ref 11.1–15.9)
MCH: 27 pg (ref 26.6–33.0)
MCHC: 33 g/dL (ref 31.5–35.7)
MCV: 82 fL (ref 79–97)
Platelets: 168 10*3/uL (ref 150–450)
RBC: 4.56 x10E6/uL (ref 3.77–5.28)
RDW: 13.7 % (ref 12.3–15.4)
WBC: 4.5 10*3/uL (ref 3.4–10.8)

## 2018-04-04 LAB — T4, FREE: Free T4: 1.2 ng/dL (ref 0.82–1.77)

## 2018-04-04 LAB — TSH: TSH: 3.1 u[IU]/mL (ref 0.450–4.500)

## 2018-04-04 LAB — T3: T3, Total: 144 ng/dL (ref 71–180)

## 2018-04-09 ENCOUNTER — Ambulatory Visit: Payer: 59 | Admitting: Psychiatry

## 2018-04-09 ENCOUNTER — Encounter: Payer: Self-pay | Admitting: Psychiatry

## 2018-04-09 ENCOUNTER — Other Ambulatory Visit: Payer: Self-pay

## 2018-04-09 VITALS — BP 136/70 | HR 83 | Temp 97.4°F | Wt 102.0 lb

## 2018-04-09 DIAGNOSIS — F411 Generalized anxiety disorder: Secondary | ICD-10-CM | POA: Diagnosis not present

## 2018-04-09 DIAGNOSIS — F5105 Insomnia due to other mental disorder: Secondary | ICD-10-CM

## 2018-04-09 NOTE — Progress Notes (Signed)
[facility] MD OP Progress Note  04/09/2018 11:20 AM Ebony Lawson  MRN:  [id number]  Chief Complaint: ' I am here for follow up." Chief Complaint    Follow-up; Medication Refill     HPI: Ebony Lawson is a 32 yr old CF, single, employed, lives in [place], has a hx of anxiety symptoms , sleep problems , presented to the clinic today for a follow-up visit.  Patient today reports that she feels she is better able to cope with her anxiety symptoms.  She continues to have some nervousness, worrying about different things, trouble relaxing however she reports she feels less distressed by these symptoms now than before.  She has been making use of her hydroxyzine especially at night.  She reports it also helps her to sleep.  She however continues to have some nights when she is restless.  Discussed with patient to increase the hydroxyzine to 30 mg as needed the nights that she feels she cannot sleep.  Reports she has not used any of the Klonopin  yet.  She is currently in psychotherapy with our therapist Ms. Alden Hipp which is going well.  She reports her therapy sessions as helpful.  Patient denies any suicidality.  Patient denies any homicidality.  Patient denies any perceptual disturbances.     Visit Diagnosis:    ICD-10-CM   1. 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  Past Medical History:  Past Medical History:  Diagnosis Date  . Heart murmur   . Migraine    History reviewed. No pertinent surgical history.  Family Psychiatric History: Reviewed family psychiatric history from my progress note on 02/15/2018  Family History:  Family History  Problem Relation Age of Onset  . Hyperlipidemia Mother   . Cancer Father        prostate and skin cancer  . Hyperlipidemia Father   . Heart disease Father        CAD - triple bypass 2014  .  Hypertension Father   . Hearing loss Paternal Grandfather   . Hypertension Paternal Grandfather   . Anxiety disorder Brother     Social History: Have reviewed social history from my progress note on 02/15/2018 Social History   Socioeconomic History  . Marital status: Single    Spouse name: Not on file  . Number of children: 0  . Years of education: 16  . Highest education level: Bachelor's degree (e.g., BA, AB, BS)  Occupational History  . Occupation: DNA Animal nutritionist: [employer]  . Financial resource strain: Not hard at all  . Food insecurity:    Worry: Never true    Inability: Never true  . Transportation needs:    Medical: No    Non-medical: No  Tobacco Use  . Smoking status: Never Smoker  . Smokeless tobacco: Never Used  Substance and Sexual Activity  . Alcohol use: Yes    Alcohol/week: 0.0 standard drinks    Comment: 2-3 drinks monthly  . Drug use: No  . Sexual activity: Yes    Partners: Male    Birth control/protection: OCP    Comment: 1 partner   Lifestyle  . Physical activity:    Days per week: 4 days    Minutes per session: 50 min  . Stress: Very much  Relationships  . Social connections:  Talks on phone: Not on file    Gets together: Not on file    Attends religious service: More than 4 times per year    Active member of club or organization: Yes    Attends meetings of clubs or organizations: More than 4 times per year    Relationship status: Never married  Other Topics Concern  . Not on file  Social History Narrative   Ambar grew up outside of [facility]. 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   Exercise - none at present   Caffeine - rare     Allergies: No Known Allergies  Metabolic Disorder Labs: Lab Results  Component Value Date   HGBA1C 5.2 12/31/2014   No results found for: PROLACTIN Lab  Results  Component Value Date   CHOL 139 04/03/2018   TRIG 94 04/03/2018   HDL 50 04/03/2018   LDLCALC 70 04/03/2018   LDLCALC 102 12/31/2014   Lab Results  Component Value Date   TSH 3.100 04/03/2018   TSH 2.220 02/19/2018    Therapeutic Level Labs: No results found for: LITHIUM No results found for: VALPROATE No components found for:  CBMZ  Current Medications: Current Outpatient Medications  Medication Sig Dispense Refill  . Adapalene 0.3 % gel Apply topically at bedtime.    . Calcium Carbonate-Vitamin D (CALCIUM 600+D3 PO) Take 1 tablet by mouth daily.    . clonazePAM (KLONOPIN) 0.25 MG disintegrating tablet Take 1 tablet (0.25 mg total) by mouth as directed. Use one tablet as needed for severe anxiety symptoms 5 tablet 0  . drospirenone-ethinyl estradiol (YAZ,GIANVI,LORYNA) 3-0.02 MG tablet Take 1 tablet by mouth daily. 1 Package 11  . fluconazole (DIFLUCAN) 150 MG tablet Take 1 tablet (150 mg total) by mouth once. 1 tablet 0  . hydrOXYzine (ATARAX/VISTARIL) 10 MG tablet Take 1-2 tablets (10-20 mg total) by mouth 2 (two) times daily as needed. For severe anxiety 100 tablet 0  . Melatonin 10 MG TABS Take 1 tablet by mouth at bedtime as needed.    . Pediatric Multivit-Minerals-C (COMPLETE MULTI-VITAMIN) CHEW Chew 1 tablet by mouth daily. Gummy vitamin    . sulfamethoxazole-trimethoprim (BACTRIM DS,SEPTRA DS) 800-160 MG tablet Take 1 tablet by mouth 2 (two) times daily. 10 tablet 0  . tretinoin (RETIN-A) 0.025 % cream      No current facility-administered medications for this visit.      Musculoskeletal: Strength & Muscle Tone: within normal limits Gait & Station: normal Patient leans: N/A  Psychiatric Specialty Exam: Review of Systems  Psychiatric/Behavioral: The patient is nervous/anxious and has insomnia.   All other systems reviewed and are negative.   Blood pressure 136/70, pulse 83, temperature (!) 97.4 F (36.3 C), temperature source Oral, weight 102 lb (46.3  kg), last menstrual period 03/26/2018.Body mass index is 19.92 kg/m.  General Appearance: Casual  Eye Contact:  Fair  Speech:  Clear and Coherent  Volume:  Normal  Mood:  Anxious  Affect:  Congruent  Thought Process:  Goal Directed and Descriptions of Associations: Intact  Orientation:  Full (Time, Place, and Person)  Thought Content: Logical   Suicidal Thoughts:  No  Homicidal Thoughts:  No  Memory:  Immediate;   Fair Recent;   Fair Remote;   Fair  Judgement:  Fair  Insight:  Fair  Psychomotor Activity:  Normal  Concentration:  Concentration:  Fair and Attention Span: Fair  Recall:  [employer] of Knowledge: Fair  Language: Fair  Akathisia:  No  Handed:  Right  AIMS (if indicated): na  Assets:  Communication Skills Desire for Improvement Social Support Talents/Skills Transportation  ADL's:  Intact  Cognition: WNL  Sleep:  restless at times   Screenings: gad 7 PHQ2-9     Office Visit from 03/21/2018 in [facility] Office Visit from 09/06/2017 in [facility], [facility]  PHQ-2 Total Score  0  0       Assessment and Plan: Naeemah is a 32 year old Caucasian female, single, employed, lives in [place], has a history of anxiety, sleep problems, presented to the clinic today for a follow-up visit.  She is biologically predisposed given her family history of mental health problems.  She also has psychosocial stressors of her work as well as shift at work which affects her sleep.  Patient is currently in psychotherapy sessions which is going well.  She also reports she is able to cope with her anxiety better than before.  Will continue plan as noted below.  Plan Generalized anxiety disorder Continue CBT. Continue hydroxyzine 10-20 mg p.o. daily as needed. She has Klonopin 0.25 mg p.o. daily as needed for severe anxiety attacks.  She was provided with 5 tablets last visit.  She has not yet used them. GAD 7 - 10   For insomnia Continue melatonin as  prescribed She also has hydroxyzine as needed.  Discussed with patient to increase the dosage of hydroxyzine to 30 mg as needed at bedtime if she needs help with sleep.  Follow-up in clinic in 2 months or sooner if needed.  More than 50 % of the time was spent for psychoeducation and supportive psychotherapy and care coordination.  This note was generated in part or whole with voice recognition software. Voice recognition is usually quite accurate but there are transcription errors that can and very often do occur. I apologize for any typographical errors that were not detected and corrected.      Ursula Alert, MD 04/09/2018, 11:20 AM

## 2018-04-17 ENCOUNTER — Ambulatory Visit: Payer: 59 | Admitting: Licensed Clinical Social Worker

## 2018-04-17 ENCOUNTER — Encounter: Payer: Self-pay | Admitting: Licensed Clinical Social Worker

## 2018-04-17 DIAGNOSIS — F411 Generalized anxiety disorder: Secondary | ICD-10-CM | POA: Diagnosis not present

## 2018-04-17 NOTE — Progress Notes (Signed)
   THERAPIST PROGRESS NOTE  Session Time: 1000  Participation Level: Active  Behavioral Response: NeatAlertAnxious  Type of Therapy: Individual Therapy  Treatment Goals addressed: Anxiety  Interventions: CBT and Supportive  Summary: Ebony Lawson is a 32 y.o. female who presents with symptoms related to her diagnosis. Ebony Lawson reports things have been going well over the last two weeks. She stated her cat was actually not pregnant, so the anxiety rooted in the possible pregnancy of her cat has been alleviated. She reports she has shifted her anxiety around her cat onto other things, such as feeling her cat is cold in the apartment. We discussed ways to control aspects of the situation she had power over, such as turning the temperature in the apartment up. Further, we discussed the idea of personifying animals, and how that could contribute to her anxiety symptoms. Ebony Lawson reported having difficulty at work being assertive in communication, which leads to "me blowing up on someone." Ebony Lawson showed insight when stating, "I should just address the issue when it first occurs." We discussed ways to be improve assertive communication, and weighed the pros/cons of passive communication as well. Ebony Lawson also reported feeling overwhelmed with the tasks she assigns herself to complete daily, "I won't do all of them, and then I'm already behind the next day." We discussed ways to decrease the amount of tasks she schedules for herself, and breaking down larger tasks into smaller steps in order to accomplish more and build momentum. Ebony Lawson expressed understanding and agreement with this idea.   Suicidal/Homicidal: No   Therapist Response: Ebony Lawson reports a decrease in anxiety symptoms since finding out her cat is not pregnant. She continues to show insight regarding her anxiety symptoms, and is able to express her feelings appropriately during our sessions. We will continue to utilize CBT to manage anxiety symptoms  moving forward.   Plan: Return again in 2 weeks.  Diagnosis: Axis I: Generalized Anxiety Disorder    Axis II: No diagnosis    Alden Hipp, LCSW 04/17/2018

## 2018-04-30 ENCOUNTER — Encounter: Payer: Self-pay | Admitting: Licensed Clinical Social Worker

## 2018-04-30 ENCOUNTER — Ambulatory Visit: Payer: 59 | Admitting: Licensed Clinical Social Worker

## 2018-04-30 DIAGNOSIS — F411 Generalized anxiety disorder: Secondary | ICD-10-CM | POA: Diagnosis not present

## 2018-04-30 NOTE — Progress Notes (Signed)
   THERAPIST PROGRESS NOTE  Session Time: 1000  Participation Level: Active  Behavioral Response: NeatAlertAnxious  Type of Therapy: Individual Therapy  Treatment Goals addressed: Anxiety  Interventions: CBT  Summary: Ebony Lawson is a 32 y.o. female who presents with symptoms related to her diagnosis. Ebony Lawson reports over the past two weeks she had a few "freak outs," but stated she feels less anxious today. She reports she experienced increased anxiety due to her cat having another eye infection, missing a call from her parents which caused her to worry, and anxiety about taking time off work. Ebony Lawson was able to recognize several thought distortions--for example, she reported feeling guilty after missing a call from her mother because she realized she had not been worrying as much about her parents. We discussed the idea of black and white thinking, and Ebony Lawson was able to recognize she had been viewing the situation as black and white, but articulated she was able to be a good daughter while not worrying about things that have not happened yet. We discussed other thought distortions that she participates in, and ways to disengage from them. Ebony Lawson reported feeling anxious about returning home for the holidays, as she feels there are several people she wants to see but limited time to do so. We discussed the idea of setting boundaries and being assertive, and remembering you can only do what you can do. Also, LCSW encouraged Ebony Lawson to remember feelings aren't facts, so if she feels guilty, that doesn't mean she is guilty. Ebony Lawson expressed understanding and agreement with this notion as well.   Suicidal/Homicidal: No  Therapist Response: Ebony Lawson is able to recognize distorted thoughts while in session, and is able to show great insight while discussing her mental health. We will continue using CBT to manage anxiety symptoms moving forward.   Plan: Return again in 2 weeks.  Diagnosis: Axis I:  Generalized Anxiety Disorder    Axis II: No diagnosis    Alden Hipp, LCSW 04/30/2018

## 2018-05-15 ENCOUNTER — Encounter: Payer: Self-pay | Admitting: Licensed Clinical Social Worker

## 2018-05-15 ENCOUNTER — Ambulatory Visit (INDEPENDENT_AMBULATORY_CARE_PROVIDER_SITE_OTHER): Payer: 59 | Admitting: Licensed Clinical Social Worker

## 2018-05-15 DIAGNOSIS — F411 Generalized anxiety disorder: Secondary | ICD-10-CM | POA: Diagnosis not present

## 2018-05-15 NOTE — Progress Notes (Signed)
   THERAPIST PROGRESS NOTE  Session Time: 1000  Participation Level: Active  Behavioral Response: NeatAlertAnxious  Type of Therapy: Individual Therapy  Treatment Goals addressed: Anxiety  Interventions: CBT  Summary: Modest Draeger is a 32 y.o. female who presents with symptoms related to her diagnosis. Skylyn reports having a rough night at work, and stated feeling very frustrated. 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 Jozey reported feeling frustrated she came in early to train someone, and was no longer needed to train the person. LCSW asked Oral if she communicated her frustration to her co-worker, which Kayla reported she did not. Caasi was able to recognize the ways assertive communication would have benefited her in the situation. She reported she could have stated, "I came in early to train him, so I'm going to take him over and show him x, y, and z." We discussed ways to implement assertive communication strategies as well.   Shanira reported her holiday at home went well, and she was able to schedule time to see friends and family by setting boundaries. Alyene reported feeling stressed recently as her hair has been falling out. She stated she spoke with her primary care physician regarding this issue, and was told it was stress related and a Vitamin D deficiency. We discussed ways to improve hair health, but also ways to decrease anxiety around the situation. Sonya was able to recognize the piece of the situation she had control over, and which pieces she did not. Through that process, she was able to decrease her anxiety. Additionally, Kataleya reported continuing to struggle with her to-do list and achieving things on her list. We discussed ways to improve motivation, but LCSW also noted Virdie needed to allow her mind and body time to recharge in order to have  motivation to complete tasks. Curtisha expressed understanding and agreement with this idea.   Suicidal/Homicidal: No  Therapist Response: Eva is able to utilize skills learned in previous sessions and apply them to her daily life and interactions. We will continue to work on assertive communication and will continue to utilize CBT moving forward to assist Andra in managing her anxiety symptoms. We will also continue to work on setting appropriate boundaries with others as it relates to her co-workers and friends.   Plan: Return again in 3 weeks.  Diagnosis: Axis I: Generalized Anxiety Disorder    Axis II: No diagnosis    Alden Hipp, LCSW 05/15/2018

## 2018-06-11 ENCOUNTER — Ambulatory Visit: Payer: 59 | Admitting: Psychiatry

## 2018-06-11 ENCOUNTER — Encounter: Payer: Self-pay | Admitting: Psychiatry

## 2018-06-11 ENCOUNTER — Other Ambulatory Visit: Payer: Self-pay

## 2018-06-11 VITALS — BP 119/73 | HR 85 | Temp 98.0°F | Wt 101.0 lb

## 2018-06-11 DIAGNOSIS — F411 Generalized anxiety disorder: Secondary | ICD-10-CM

## 2018-06-11 DIAGNOSIS — F5105 Insomnia due to other mental disorder: Secondary | ICD-10-CM | POA: Diagnosis not present

## 2018-06-11 MED ORDER — HYDROXYZINE HCL 10 MG PO TABS: 10.0000 mg | ORAL_TABLET | Freq: Two times a day (BID) | ORAL | 0 refills | Status: DC | PRN

## 2018-06-11 NOTE — Progress Notes (Signed)
[facility] MD OP Progress Note  06/11/2018 6:07 PM Ebony Lawson  MRN:  [id number]  Chief Complaint: ' I am here for follow up." Chief Complaint    Follow-up; Medication Refill     HPI: Ebony Lawson is a 33 year old single Caucasian female, employed, lives in [place], has a history of anxiety, sleep problems, presented to the clinic today for a follow-up visit.  Patient reports her holidays went well.  She spent it with her family.  She reports her anxiety symptoms is improving.  She she continues to limit the use of hydroxyzine and uses it only at night.  Patient denies any suicidality.  Patient denies any perceptual disturbances.  She continues to be in psychotherapy and reports it is beneficial.  Patient denies any other concerns today. Visit Diagnosis:    ICD-10-CM   1. GAD (generalized anxiety disorder) F41.1 hydrOXYzine (ATARAX/VISTARIL) 10 MG tablet  2. Insomnia due to mental condition F51.05     Past Psychiatric History: Reviewed past psychiatric history from my progress note on 02/15/2018  Past Medical History:  Past Medical History:  Diagnosis Date  . Heart murmur   . Migraine    History reviewed. No pertinent surgical history.  Family Psychiatric History: I have reviewed family psychiatric history from my progress note on 02/15/2018  Family History:  Family History  Problem Relation Age of Onset  . Hyperlipidemia Mother   . Cancer Father        prostate and skin cancer  . Hyperlipidemia Father   . Heart disease Father        CAD - triple bypass 2014  . Hypertension Father   . Hearing loss Paternal Grandfather   . Hypertension Paternal Grandfather   . Anxiety disorder Brother     Social History: Reviewed social history from my progress note on 02/15/2018. Social History   Socioeconomic History  . Marital status: Single    Spouse name: Not on file  . Number of children: 0  . Years of education: 16  . Highest education level: Bachelor's degree (e.g., BA, AB, BS)   Occupational History  . Occupation: DNA Animal nutritionist: [employer]  . Financial resource strain: Not hard at all  . Food insecurity:    Worry: Never true    Inability: Never true  . Transportation needs:    Medical: No    Non-medical: No  Tobacco Use  . Smoking status: Never Smoker  . Smokeless tobacco: Never Used  Substance and Sexual Activity  . Alcohol use: Yes    Alcohol/week: 0.0 standard drinks    Comment: 2-3 drinks monthly  . Drug use: No  . Sexual activity: Yes    Partners: Male    Birth control/protection: OCP    Comment: 1 partner   Lifestyle  . Physical activity:    Days per week: 4 days    Minutes per session: 50 min  . Stress: Very much  Relationships  . Social connections:    Talks on phone: Not on file    Gets together: Not on file    Attends religious service: More than 4 times per year    Active member of club or organization: Yes    Attends meetings of clubs or organizations: More than 4 times per year    Relationship status: Never married  Other Topics Concern  . Not on file  Social History Narrative   Kadajah grew up outside of [facility]. 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   Exercise - none at present   Caffeine - rare     Allergies: No Known Allergies  Metabolic Disorder Labs: Lab Results  Component Value Date   HGBA1C 5.2 12/31/2014   No results found for: PROLACTIN Lab Results  Component Value Date   CHOL 139 04/03/2018   TRIG 94 04/03/2018   HDL 50 04/03/2018   LDLCALC 70 04/03/2018   LDLCALC 102 12/31/2014   Lab Results  Component Value Date   TSH 3.100 04/03/2018   TSH 2.220 02/19/2018    Therapeutic Level Labs: No results found for: LITHIUM No results found for: VALPROATE No components found for:  CBMZ  Current Medications: Current Outpatient Medications  Medication Sig  Dispense Refill  . Adapalene 0.3 % gel Apply topically at bedtime.    . Calcium Carbonate-Vitamin D (CALCIUM 600+D3 PO) Take 1 tablet by mouth daily.    . clonazePAM (KLONOPIN) 0.25 MG disintegrating tablet Take 1 tablet (0.25 mg total) by mouth as directed. Use one tablet as needed for severe anxiety symptoms 5 tablet 0  . drospirenone-ethinyl estradiol (YAZ,GIANVI,LORYNA) 3-0.02 MG tablet Take 1 tablet by mouth daily. 1 Package 11  . fluconazole (DIFLUCAN) 150 MG tablet Take 1 tablet (150 mg total) by mouth once. 1 tablet 0  . hydrOXYzine (ATARAX/VISTARIL) 10 MG tablet Take 1-2 tablets (10-20 mg total) by mouth 2 (two) times daily as needed. For severe anxiety 100 tablet 0  . Melatonin 10 MG TABS Take 1 tablet by mouth at bedtime as needed.    . Pediatric Multivit-Minerals-C (COMPLETE MULTI-VITAMIN) CHEW Chew 1 tablet by mouth daily. Gummy vitamin    . sulfamethoxazole-trimethoprim (BACTRIM DS,SEPTRA DS) 800-160 MG tablet Take 1 tablet by mouth 2 (two) times daily. 10 tablet 0  . tretinoin (RETIN-A) 0.025 % cream      No current facility-administered medications for this visit.      Musculoskeletal: Strength & Muscle Tone: within normal limits Gait & Station: normal Patient leans: N/A  Psychiatric Specialty Exam: Review of Systems  Respiratory: Positive for cough.   Psychiatric/Behavioral: The patient is nervous/anxious (improving) and has insomnia.   All other systems reviewed and are negative.   Blood pressure 119/73, pulse 85, temperature 98 F (36.7 C), temperature source Oral, weight 101 lb (45.8 kg).Body mass index is 19.73 kg/m.  General Appearance: Casual  Eye Contact:  Fair  Speech:  Normal Rate  Volume:  Normal  Mood:  Euthymic  Affect:  Congruent  Thought Process:  Goal Directed and Descriptions of Associations: Intact  Orientation:  Full (Time, Place, and Person)  Thought Content: Logical   Suicidal Thoughts:  No  Homicidal Thoughts:  No  Memory:  Immediate;    Fair Recent;   Fair Remote;   Fair  Judgement:  Fair  Insight:  Fair  Psychomotor Activity:  Normal  Concentration:  Concentration: Fair and Attention Span: Fair  Recall:  [employer] of Knowledge: Fair  Language: Fair  Akathisia:  No  Handed:  Right  AIMS (if indicated): denies tremors, rigidity  Assets:  Communication Skills Desire for Improvement Social Support  ADL's:  Intact  Cognition: WNL  Sleep:  restless due to cough   Screenings: PHQ2-9     [facility] Visit from 03/21/2018 in [facility] Office Visit from 09/06/2017 in [facility]  PHQ-2 Total Score  0  0       Assessment and Plan: Ebony Lawson is a 33 year old Caucasian female, single, employed, lives in [place], has a history of anxiety, sleep problems, presented to the clinic today for a follow-up visit.  Patient is biologically predisposed given her family history of mental health problems.  She also has psychosocial stressors of her work as well as shift at work which affects her sleep.  Patient however currently is making progress on the current medication regimen.  Will continue plan as noted below.  Plan Generalized anxiety disorder -  improving Continue CBT. Continue hydroxyzine 10 to 20 mg p.o. daily as needed Klonopin 0.25 mg p.o. daily PRN for severe anxiety attacks.  She has been limiting use.  For insomnia- currently restless due to cough. Continue melatonin as prescribed. She also has hydroxyzine as needed.  She will continue psychotherapy sessions.  Follow-up in clinic in 3 months or sooner if needed.  More than 50 % of the time was spent for psychoeducation and supportive psychotherapy and care coordination.  This note was generated in part or whole with voice recognition software. Voice recognition is usually quite accurate but there are transcription errors that can and very often do occur. I apologize for any typographical errors that were not detected and  corrected.       Ursula Alert, MD 06/11/2018, 6:07 PM

## 2018-06-12 ENCOUNTER — Ambulatory Visit: Payer: 59 | Admitting: Licensed Clinical Social Worker

## 2018-07-02 ENCOUNTER — Ambulatory Visit: Payer: 59 | Admitting: Licensed Clinical Social Worker

## 2018-07-02 ENCOUNTER — Encounter: Payer: Self-pay | Admitting: Licensed Clinical Social Worker

## 2018-07-02 DIAGNOSIS — F411 Generalized anxiety disorder: Secondary | ICD-10-CM | POA: Diagnosis not present

## 2018-07-02 NOTE — Progress Notes (Signed)
   THERAPIST PROGRESS NOTE  Session Time: 1000  Participation Level: Active  Behavioral Response: Well GroomedAlertAnxious  Type of Therapy: Individual Therapy  Treatment Goals addressed: Anxiety  Interventions: CBT  Summary: Ebony Lawson is a 33 y.o. female who presents with symptoms related to her diagnosis. Ebony Lawson reports, "a lot has happened since the last time we talked." Ebony Lawson reports multiple health concerns with her cat, and stated continued anxiety around those problems. She reported around Christmas, her cat threw up and became catatonic. She ended up taking her to the emergency vet, and the cat was okay. Following that, there was a week where the cat was not drinking water, the cat went into heat, and now the cat may have a lung problem. Ebony Lawson reported she is brining her cat into the vet this afternoon to have her examined in order to figure out the cause of the cough. Ebony Lawson reported feeling like it's been, "one thing after another." LCSW validated the feeling of being overwhelmed, and the feelings of guilt Ebony Lawson was expressing. LCSW encouraged Ebony Lawson to identify the ways in which she responded positively to the situation. She was able to recognize the ways she took control over various aspects of the situation, including moving the vet appointment to be sooner in order to find out information more quickly. LCSW validated that process and encouraged [name] to focus on that versus what she "should," have done. LCSW also highlighted the way "shoulds" can contribute to anxiety. Ebony Lawson expressed agreement and understanding with this idea.   Suicidal/Homicidal: No  Therapist Response: Ebony Lawson was able to speak openly about her anxiety symptoms and how they have been presenting in her life most recently. Ebony Lawson was able to utilize skills learned in previous sessions, and reported she has started journaling her anxious thoughts. LCSW encouraged Ebony Lawson to end her journal entries with a reframed  positive thought in order to incorporate CBT. Ebony Lawson expressed agreement with this idea. We will continue to utilize CBT moving forward to assist Ebony Lawson in managing her anxiety symptoms.   Plan: Return again in 2 weeks.  Diagnosis: Axis I: Generalized Anxiety Disorder    Axis II: No diagnosis    Ebony Hipp, LCSW 07/02/2018

## 2018-07-17 ENCOUNTER — Encounter: Payer: Self-pay | Admitting: Licensed Clinical Social Worker

## 2018-07-17 ENCOUNTER — Ambulatory Visit: Payer: 59 | Admitting: Licensed Clinical Social Worker

## 2018-07-17 DIAGNOSIS — F411 Generalized anxiety disorder: Secondary | ICD-10-CM

## 2018-07-17 NOTE — Progress Notes (Signed)
   THERAPIST PROGRESS NOTE  Session Time: 1000-1052  Participation Level: Active  Behavioral Response: Well GroomedAlertAnxious  Type of Therapy: Individual Therapy  Treatment Goals addressed: Anxiety  Interventions: CBT  Summary: Trisa Cranor is a 33 y.o. female who presents with continued symptoms of her diagnosis. Kenishia reports things have gone well since our last session. 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 LCSW highlighted that it was not the worst case scenario as Haille was previously worried about, and encouraged Hadlee to use that as evidence for challenging negative thoughts around her cat's health in the future. Katriana was able to recognize this and expressed agreement. Further, Shaylynn reports she was not able to have her cat spayed due to the asthma, but was hoping she would be able to do so in the next couple weeks. 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 LCSW validated her use of assertive communication and encouraged her to recognize the positive outcome. The last thing Donnarae reported having anxiety around was the idea of starting a new medication to clear up her acne. She reported she was completely on board with the idea until she started reading about the side affects. LCSW encouraged Rosalea to gather more information and consult with her primary care doctor in order to make an informed decision. Tirzah agreed and reported she would feel more comfortable having more information before making a decision. LCSW encouraged Ritamarie to utilize CBT to manage her anxiety around the subject.   Suicidal/Homicidal: No  Therapist Response: Eda continues to work towards her tx goals but has not yet reached  them. She is able to utilize skills learned in previous sessions and apply them to her daily life. She is able to recognize progress and areas that she could still improve in. She is able to accept input from LCSW as well. We will continue to utilize CBT moving forward to assist Rubie in managing her anxiety symptoms.   Plan: Return again in 2 weeks.  Diagnosis: Axis I: Generalized Anxiety Disorder    Axis II: No diagnosis    Alden Hipp, LCSW 07/17/2018

## 2018-07-19 ENCOUNTER — Encounter: Payer: Self-pay | Admitting: Nurse Practitioner

## 2018-08-07 ENCOUNTER — Ambulatory Visit: Payer: 59 | Admitting: Licensed Clinical Social Worker

## 2018-08-13 ENCOUNTER — Encounter: Payer: Self-pay | Admitting: Licensed Clinical Social Worker

## 2018-08-13 ENCOUNTER — Ambulatory Visit: Payer: 59 | Admitting: Licensed Clinical Social Worker

## 2018-08-13 DIAGNOSIS — F411 Generalized anxiety disorder: Secondary | ICD-10-CM | POA: Diagnosis not present

## 2018-08-13 NOTE — Progress Notes (Signed)
   THERAPIST PROGRESS NOTE  Session Time: 7425-9563  Participation Level: Active  Behavioral Response: Well GroomedAlertAnxious  Type of Therapy: Individual Therapy  Treatment Goals addressed: Anxiety  Interventions: CBT  Summary: Ebony Lawson is a 33 y.o. female who presents with continued symptoms of her diagnosis. Dannae reports doing well since our last session. She reports medical problems with her cat have resolved--which was a primary source of her anxiety previously. She reports her cat was fixed and had an extreme reaction to pain medication so took a bit longer to recover, but is doing well now. Enez reports feeling relieved about this aspect of her life. She reports feeling anxious about moving this summer. She reports wanting to move in with her boyfriend but is hesitant because she knows her parents would disapprove. LCSW encouraged Mahika to broach the subject with her boyfriend first, and then if he is on board she could address it with her parents. We discussed pros and cons of moving in with him, and how she could address each of her worries regarding the situation. LCSW encouraged Graycee to utilize her thought record to manage her anxiety around the event. Rilyn expressed agreement and understanding with this idea. 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   Suicidal/Homicidal: No  Therapist Response: Makaylyn continues to work towards her tx goals but has not yet reached them. We will continue to utilize CBT moving forward to assist Lolly in managing her anxiety and emotional regulation skills.   Plan: Return again in 4 weeks.  Diagnosis: Axis I: Generalized Anxiety Disorder    Axis II: No diagnosis    Alden Hipp, LCSW 08/13/2018

## 2018-09-10 ENCOUNTER — Ambulatory Visit: Payer: 59 | Admitting: Licensed Clinical Social Worker

## 2018-09-10 ENCOUNTER — Ambulatory Visit: Payer: 59 | Admitting: Psychiatry

## 2018-09-16 ENCOUNTER — Encounter: Payer: Self-pay | Admitting: Psychiatry

## 2018-09-16 ENCOUNTER — Other Ambulatory Visit: Payer: Self-pay

## 2018-09-16 ENCOUNTER — Ambulatory Visit (INDEPENDENT_AMBULATORY_CARE_PROVIDER_SITE_OTHER): Payer: 59 | Admitting: Psychiatry

## 2018-09-16 DIAGNOSIS — F411 Generalized anxiety disorder: Secondary | ICD-10-CM | POA: Diagnosis not present

## 2018-09-16 DIAGNOSIS — F5105 Insomnia due to other mental disorder: Secondary | ICD-10-CM

## 2018-09-16 MED ORDER — TRAZODONE HCL 50 MG PO TABS: 25.0000 mg | ORAL_TABLET | Freq: Every day | ORAL | 1 refills | Status: DC

## 2018-09-16 NOTE — Progress Notes (Signed)
Virtual Visit via Video Note  I connected with Ebony Lawson on 09/16/18 at 10:15 AM EDT by a video enabled telemedicine application and verified that I am speaking with the correct person using two identifiers.   I discussed the limitations of evaluation and management by telemedicine and the availability of in person appointments. The patient expressed understanding and agreed to proceed.    I discussed the assessment and treatment plan with the patient. The patient was provided an opportunity to ask questions and all were answered. The patient agreed with the plan and demonstrated an understanding of the instructions.   The patient was advised to call back or seek an in-person evaluation if the symptoms worsen or if the condition fails to improve as anticipated.  I provided 15 minutes of non-face-to-face time during this encounter.    [facility] MD OP Progress Note  09/16/2018 12:36 PM Ebony Lawson  MRN:  [id number]  Chief Complaint:  Chief Complaint    Follow-up; Anxiety     HPI: Ebony Lawson is a 33 year old Caucasian female, single, employed, lives in [place], has a history of anxiety, insomnia was evaluated by telemedicine today.  Patient today reports she is currently anxious about the COVID-19 outbreak.  She reports there have been taking precautions at her workplace.  She works at her lab.  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  She however is anxious due to the current situational stressors.  She reports she is also worried about what changes they will be taking at her workplace.  She reports she has had a few panic attacks recently.  She does have hydroxyzine as needed.  She also has supplies of Klonopin a few pills which she never used.  She is hoping to make use of her medications as needed if she continues to struggle with panic symptoms.  She also has psychotherapy visits with her therapist which are beneficial.  Patient reports sleep is restless  at this time.  She takes melatonin 9 mg.  She does not think it is helpful.  She has tried adding hydroxyzine to her melatonin which also does not help her to stay asleep.  Discussed adding trazodone.  She agrees with plan.  Patient denies any suicidality, homicidality or perceptual disturbances.  She reports her family lives in [place] and she has not visited them in a while because of the COVID-19 outbreak.  She however does call them and also do face tabs.  Patient denies any other concerns today. Visit Diagnosis:    ICD-10-CM   1. GAD (generalized anxiety disorder) F41.1   2. Insomnia due to mental condition F51.05 traZODone (DESYREL) 50 MG tablet    Past Psychiatric History: I have reviewed past psychiatric history from my progress note on 02/15/2018.  Past Medical History:  Past Medical History:  Diagnosis Date  . Heart murmur   . Migraine    History reviewed. No pertinent surgical history.  Family Psychiatric History: I have reviewed family psychiatric history from my progress note on 02/15/2018.  Family History:  Family History  Problem Relation Age of Onset  . Hyperlipidemia Mother   . Cancer Father        prostate and skin cancer  . Hyperlipidemia Father   . Heart disease Father        CAD - triple bypass 2014  . Hypertension Father   . Hearing loss Paternal Grandfather   . Hypertension Paternal Grandfather   . Anxiety disorder Brother  Social History: Reviewed social history from my progress note on 02/15/2018. Social History   Socioeconomic History  . Marital status: Single    Spouse name: Not on file  . Number of children: 0  . Years of education: 16  . Highest education level: Bachelor's degree (e.g., BA, AB, BS)  Occupational History  . Occupation: DNA Animal nutritionist: [employer]  . Financial resource strain: Not hard at all  . Food insecurity:    Worry: Never true    Inability: Never true  . Transportation needs:     Medical: No    Non-medical: No  Tobacco Use  . Smoking status: Never Smoker  . Smokeless tobacco: Never Used  Substance and Sexual Activity  . Alcohol use: Yes    Alcohol/week: 0.0 standard drinks    Comment: 2-3 drinks monthly  . Drug use: No  . Sexual activity: Yes    Partners: Male    Birth control/protection: OCP    Comment: 1 partner   Lifestyle  . Physical activity:    Days per week: 4 days    Minutes per session: 50 min  . Stress: Very much  Relationships  . Social connections:    Talks on phone: Not on file    Gets together: Not on file    Attends religious service: More than 4 times per year    Active member of club or organization: Yes    Attends meetings of clubs or organizations: More than 4 times per year    Relationship status: Never married  Other Topics Concern  . Not on file  Social History Narrative   Ebony Lawson grew up outside of [facility]. 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   Exercise - none at present   Caffeine - rare     Allergies: No Known Allergies  Metabolic Disorder Labs: Lab Results  Component Value Date   HGBA1C 5.2 12/31/2014   No results found for: PROLACTIN Lab Results  Component Value Date   CHOL 139 04/03/2018   TRIG 94 04/03/2018   HDL 50 04/03/2018   LDLCALC 70 04/03/2018   LDLCALC 102 12/31/2014   Lab Results  Component Value Date   TSH 3.100 04/03/2018   TSH 2.220 02/19/2018    Therapeutic Level Labs: No results found for: LITHIUM No results found for: VALPROATE No components found for:  CBMZ  Current Medications: Current Outpatient Medications  Medication Sig Dispense Refill  . Adapalene 0.3 % gel Apply topically at bedtime.    . Calcium Carbonate-Vitamin D (CALCIUM 600+D3 PO) Take 1 tablet by mouth daily.    . drospirenone-ethinyl estradiol (YAZ,GIANVI,LORYNA) 3-0.02 MG tablet Take 1 tablet by  mouth daily. 1 Package 11  . fluconazole (DIFLUCAN) 150 MG tablet Take 1 tablet (150 mg total) by mouth once. 1 tablet 0  . hydrOXYzine (ATARAX/VISTARIL) 10 MG tablet Take 1-2 tablets (10-20 mg total) by mouth 2 (two) times daily as needed. For severe anxiety 100 tablet 0  . Melatonin 10 MG TABS Take 1 tablet by mouth at bedtime as needed.    . Pediatric Multivit-Minerals-C (COMPLETE MULTI-VITAMIN) CHEW Chew 1 tablet by mouth daily. Gummy vitamin    . spironolactone (ALDACTONE) 100 MG tablet     . tretinoin (RETIN-A) 0.025 % cream     . traZODone (DESYREL)  50 MG tablet Take 0.5-1 tablets (25-50 mg total) by mouth at bedtime. insomnia 30 tablet 1   No current facility-administered medications for this visit.      Musculoskeletal: Strength & Muscle Tone: UTA Gait & Station: normal Patient leans: N/A  Psychiatric Specialty Exam: Review of Systems  Psychiatric/Behavioral: The patient is nervous/anxious and has insomnia.   All other systems reviewed and are negative.   Last menstrual period 09/06/2018.There is no height or weight on file to calculate BMI.  General Appearance: Casual  Eye Contact:  Fair  Speech:  Normal Rate  Volume:  Normal  Mood:  Anxious  Affect:  Congruent  Thought Process:  Goal Directed and Descriptions of Associations: Intact  Orientation:  Full (Time, Place, and Person)  Thought Content: Logical   Suicidal Thoughts:  No  Homicidal Thoughts:  No  Memory:  Immediate;   Fair Recent;   Fair Remote;   Fair  Judgement:  Fair  Insight:  Fair  Psychomotor Activity:  Normal  Concentration:  Concentration: Fair and Attention Span: Fair  Recall:  [employer] of Knowledge: Fair  Language: Fair  Akathisia:  No  Handed:  Right  AIMS (if indicated): Denies tremors, rigidity,stiffness  Assets:  Communication Skills Desire for Improvement Social Support  ADL's:  Intact  Cognition: WNL  Sleep:  Poor   Screenings: PHQ2-9     Office Visit from 03/21/2018 in [facility], [facility] Office Visit from 09/06/2017 in [facility], [facility]  PHQ-2 Total Score  0  0       Assessment and Plan: Anaiya is a 33 year old Caucasian female, single, employed, lives in [place], has a history of anxiety, sleep problems, was evaluated by telemedicine today.  Patient is biologically predisposed given her family history of mental health problems.  She also has psychosocial stressors of current COVID-19 crisis.  She does struggle with anxiety and sleep problems.  We will continue to make medication changes.  Plan Generalized anxiety disorder-some progress Continue CBT. Continue hydroxyzine 10 to 20 mg daily as needed. She also has Klonopin 0.25 mg p.o. daily PRN for severe anxiety attacks.  She has been limiting use.  For insomnia- unstable Start trazodone 25 to 50 mg p.o. nightly. Continue melatonin however is a lower dose of 1 to 2 mg. She also has hydroxyzine as needed available.  She will continue CBT with her therapist.  Follow-up in clinic in 3 to 4 weeks or sooner if needed.  I have spent atleast 15 minutes non face to face with patient today. More than 50 % of the time was spent for psychoeducation and supportive psychotherapy and care coordination.  This note was generated in part or whole with voice recognition software. Voice recognition is usually quite accurate but there are transcription errors that can and very often do occur. I apologize for any typographical errors that were not detected and corrected.        Ursula Alert, MD 09/16/2018, 12:36 PM

## 2018-09-16 NOTE — Patient Instructions (Signed)
Trazodone tablets What is this medicine? TRAZODONE (TRAZ oh done) is used to treat depression. This medicine may be used for other purposes; ask your health care provider or pharmacist if you have questions. COMMON BRAND NAME(S): Desyrel What should I tell my health care provider before I take this medicine? They need to know if you have any of these conditions: -attempted suicide or thinking about it -bipolar disorder -bleeding problems -glaucoma -heart disease, or previous heart attack -irregular heart beat -kidney or liver disease -low levels of sodium in the blood -an unusual or allergic reaction to trazodone, other medicines, foods, dyes or preservatives -pregnant or trying to get pregnant -breast-feeding How should I use this medicine? Take this medicine by mouth with a glass of water. Follow the directions on the prescription label. Take this medicine shortly after a meal or a light snack. Take your medicine at regular intervals. Do not take your medicine more often than directed. Do not stop taking this medicine suddenly except upon the advice of your doctor. Stopping this medicine too quickly may cause serious side effects or your condition may worsen. A special MedGuide will be given to you by the pharmacist with each prescription and refill. Be sure to read this information carefully each time. Talk to your pediatrician regarding the use of this medicine in children. Special care may be needed. Overdosage: If you think you have taken too much of this medicine contact a poison control center or emergency room at once. NOTE: This medicine is only for you. Do not share this medicine with others. What if I miss a dose? If you miss a dose, take it as soon as you can. If it is almost time for your next dose, take only that dose. Do not take double or extra doses. What may interact with this medicine? Do not take this medicine with any of the following medications: -certain medicines  for fungal infections like fluconazole, itraconazole, ketoconazole, posaconazole, voriconazole -cisapride -dofetilide -dronedarone -linezolid -MAOIs like Carbex, Eldepryl, Marplan, Nardil, and Parnate -mesoridazine -methylene blue (injected into a vein) -pimozide -saquinavir -thioridazine This medicine may also interact with the following medications: -alcohol -antiviral medicines for HIV or AIDS -aspirin and aspirin-like medicines -barbiturates like phenobarbital -certain medicines for blood pressure, heart disease, irregular heart beat -certain medicines for depression, anxiety, or psychotic disturbances -certain medicines for migraine headache like almotriptan, eletriptan, frovatriptan, naratriptan, rizatriptan, sumatriptan, zolmitriptan -certain medicines for seizures like carbamazepine and phenytoin -certain medicines for sleep -certain medicines that treat or prevent blood clots like dalteparin, enoxaparin, warfarin -digoxin -fentanyl -lithium -NSAIDS, medicines for pain and inflammation, like ibuprofen or naproxen -other medicines that prolong the QT interval (cause an abnormal heart rhythm) -rasagiline -supplements like St. John's wort, kava kava, valerian -tramadol -tryptophan This list may not describe all possible interactions. Give your health care provider a list of all the medicines, herbs, non-prescription drugs, or dietary supplements you use. Also tell them if you smoke, drink alcohol, or use illegal drugs. Some items may interact with your medicine. What should I watch for while using this medicine? Tell your doctor if your symptoms do not get better or if they get worse. Visit your doctor or health care professional for regular checks on your progress. Because it may take several weeks to see the full effects of this medicine, it is important to continue your treatment as prescribed by your doctor. Patients and their families should watch out for new or worsening  thoughts of suicide or depression. Also watch   out for sudden changes in feelings such as feeling anxious, agitated, panicky, irritable, hostile, aggressive, impulsive, severely restless, overly excited and hyperactive, or not being able to sleep. If this happens, especially at the beginning of treatment or after a change in dose, call your health care professional. You may get drowsy or dizzy. Do not drive, use machinery, or do anything that needs mental alertness until you know how this medicine affects you. Do not stand or sit up quickly, especially if you are an older patient. This reduces the risk of dizzy or fainting spells. Alcohol may interfere with the effect of this medicine. Avoid alcoholic drinks. This medicine may cause dry eyes and blurred vision. If you wear contact lenses you may feel some discomfort. Lubricating drops may help. See your eye doctor if the problem does not go away or is severe. Your mouth may get dry. Chewing sugarless gum, sucking hard candy and drinking plenty of water may help. Contact your doctor if the problem does not go away or is severe. What side effects may I notice from receiving this medicine? Side effects that you should report to your doctor or health care professional as soon as possible: -allergic reactions like skin rash, itching or hives, swelling of the face, lips, or tongue -elevated mood, decreased need for sleep, racing thoughts, impulsive behavior -confusion -fast, irregular heartbeat -feeling faint or lightheaded, falls -feeling agitated, angry, or irritable -loss of balance or coordination -painful or prolonged erections -restlessness, pacing, inability to keep still -suicidal thoughts or other mood changes -tremors -trouble sleeping -seizures -unusual bleeding or bruising Side effects that usually do not require medical attention (report to your doctor or health care professional if they continue or are bothersome): -change in sex drive or  performance -change in appetite or weight -constipation -headache -muscle aches or pains -nausea This list may not describe all possible side effects. Call your doctor for medical advice about side effects. You may report side effects to FDA at 1-800-FDA-1088. Where should I keep my medicine? Keep out of the reach of children. Store at room temperature between 15 and 30 degrees C (59 to 86 degrees F). Protect from light. Keep container tightly closed. Throw away any unused medicine after the expiration date. NOTE: This sheet is a summary. It may not cover all possible information. If you have questions about this medicine, talk to your doctor, pharmacist, or health care provider.  2019 Elsevier/Gold Standard (2017-07-31 17:51:24)  

## 2018-09-16 NOTE — Progress Notes (Signed)
Tc on  09-16-18 @  10:23 pt medical and surgical hx were reviewed with no update. Pt allergies were reviewed with no changes. Pt medications and pharmacy were reviewed and updated. No vitals take due to this is a phone consult.

## 2018-09-18 ENCOUNTER — Other Ambulatory Visit: Payer: Self-pay

## 2018-09-18 ENCOUNTER — Ambulatory Visit (INDEPENDENT_AMBULATORY_CARE_PROVIDER_SITE_OTHER): Payer: 59 | Admitting: Licensed Clinical Social Worker

## 2018-09-18 ENCOUNTER — Encounter: Payer: Self-pay | Admitting: Licensed Clinical Social Worker

## 2018-09-18 DIAGNOSIS — F411 Generalized anxiety disorder: Secondary | ICD-10-CM | POA: Diagnosis not present

## 2018-09-18 NOTE — Progress Notes (Signed)
  Virtual Visit via Telephone Note  I connected with Naydelin Cawley on 09/18/18 at 11:00 AM EDT by telephone and verified that I am speaking with the correct person using two identifiers.   I discussed the limitations, risks, security and privacy concerns of performing an evaluation and management service by telephone and the availability of in person appointments. I also discussed with the patient that there may be a patient responsible charge related to this service. The patient expressed understanding and agreed to proceed.  I discussed the assessment and treatment plan with the patient. The patient was provided an opportunity to ask questions and all were answered. The patient agreed with the plan and demonstrated an understanding of the instructions.   The patient was advised to call back or seek an in-person evaluation if the symptoms worsen or if the condition fails to improve as anticipated.  I provided 45 minutes of non-face-to-face time during this encounter.   Alden Hipp, LCSW   THERAPIST PROGRESS NOTE  Session Time: 1100  Participation Level: Active  Behavioral Response: NAAlertAnxious  Type of Therapy: Individual Therapy  Treatment Goals addressed: Anxiety  Interventions: CBT  Summary: Seven Marengo is a 33 y.o. female who presents with continued symptoms of her diagnosis. Kirstan reports doing, "okay," since our last session. She reports increased anxiety in multiple areas of her life. She reports feeling anxious about still going into work, but then feels guilty for not being grateful for still having a job. LCSW normalized those feelings and encouraged Deziah to allow herself to feel whatever she is feeling without invalidating herself by saying "well it could be worse." Amarilys expressed understanding and agreement with this idea. She added she has felt disappointed about missing certain events, especially Easter with her family, and then feels guilty for feeling  disappointed. LCSW validated those feelings and again encouraged Cathleen to allow herself to feel disappointed and discussed the idea of dialectical thinking and how two things can be true at one time. Jadence was in agreement and expressed understanding. Tionne reported she does not usually write, but she wrote a poem about Easter and her feelings around it, and shared it on [place]. She also shared her poem with LCSW. LCSW encouraged Ryenne to begin writing about other areas of her life as well, as that seemed to help decrease anxiety around missing Easter. Mariette was in agreement. Further, Gudelia expressed ongoing anxiety around her cat's health problems. LCSW asked Lexxus to begin challenging negative thoughts as they arise, and utilize past experiences as evidence that her cat will be okay. Nalina expressed agreement with this idea as well.   Suicidal/Homicidal: No  Therapist Response: Aara continues to work towards her tx goals but has not yet reached them. We will continue to utilize CBT to assist Tanelle in managing anxiety symptoms and staying safe/calm in the moment.   Plan: Return again in 2 weeks.  Diagnosis: Axis I: Generalized Anxiety Disorder    Axis II: No diagnosis    Alden Hipp, LCSW 09/18/2018

## 2018-09-19 ENCOUNTER — Ambulatory Visit: Payer: Self-pay | Admitting: Nurse Practitioner

## 2018-10-03 ENCOUNTER — Ambulatory Visit: Payer: 59 | Admitting: Licensed Clinical Social Worker

## 2018-10-09 ENCOUNTER — Ambulatory Visit (INDEPENDENT_AMBULATORY_CARE_PROVIDER_SITE_OTHER): Payer: 59 | Admitting: Licensed Clinical Social Worker

## 2018-10-09 ENCOUNTER — Other Ambulatory Visit: Payer: Self-pay

## 2018-10-09 ENCOUNTER — Encounter: Payer: Self-pay | Admitting: Licensed Clinical Social Worker

## 2018-10-09 DIAGNOSIS — F411 Generalized anxiety disorder: Secondary | ICD-10-CM

## 2018-10-09 NOTE — Progress Notes (Signed)
Virtual Visit via Telephone Note  I connected with Ebony Lawson on 10/09/18 at  8:00 AM EDT by telephone and verified that I am speaking with the correct person using two identifiers.   I discussed the limitations, risks, security and privacy concerns of performing an evaluation and management service by telephone and the availability of in person appointments. I also discussed with the patient that there may be a patient responsible charge related to this service. The patient expressed understanding and agreed to proceed.   I discussed the assessment and treatment plan with the patient. The patient was provided an opportunity to ask questions and all were answered. The patient agreed with the plan and demonstrated an understanding of the instructions.   The patient was advised to call back or seek an in-person evaluation if the symptoms worsen or if the condition fails to improve as anticipated.  I provided 50 minutes of non-face-to-face time during this encounter.   Alden Hipp, LCSW    THERAPIST PROGRESS NOTE  Session Time: 0800  Participation Level: Active  Behavioral Response: NAAlertAnxious  Type of Therapy: Individual Therapy  Treatment Goals addressed: Anxiety  Interventions: CBT  Summary: Ebony Lawson is a 33 y.o. female who presents with continued symptoms of her diagnosis. Ebony Lawson reports doing well since our last session and reports she has not experienced increased anxiety over the last three weeks. Ebony Lawson reports her primary anxiety is around looking to the future and wondering if we will be able to resume any sense of normalcy anytime soon. LCSW validated and normalized those feelings and encouraged Ebony Lawson to challenge herself to stay in the moment and recognize the information we have is changing so frequently. Ebony Lawson expressed understanding and agreement with this idea. Ebony Lawson reports worrying that she is not utilizing her time as she should when she is not at work, "I  just feel like I should be doing all of these things because I'm supposed to have the time now." LCSW validated those feelings but asked Ebony Lawson who was setting the expectations for her of what she should be doing. Ebony Lawson reports she was setting those expectations--LCSW encouraged Ebony Lawson to adapt her expectations and allow herself to do nothing if she feels like not being productive. Ebony Lawson expressed understanding and agreement with this idea. Further, Ebony Lawson discussed feeling anxious about discussing moving in with her boyfriend as he seems hesitant. LCSW encouraged Ebony Lawson to utilize assertive communication to bring this conversation up, and ask open ended questions to allow him to provide more information that may allow her anxiety to decrease. Ebony Lawson expressed understanding and agreement with this idea as well.   Suicidal/Homicidal: No  Therapist Response: Ebony Lawson continues to work towards her tx goals but has not yet reached them. We will continue to work on emotional regulation skills and decreasing anxiety by utilizing CBT moving forward.   Plan: Return again in 3 weeks.  Diagnosis: Axis I: Generalized Anxiety Disorder    Axis II: No diagnosis    Alden Hipp, LCSW 10/09/2018

## 2018-10-15 ENCOUNTER — Ambulatory Visit (INDEPENDENT_AMBULATORY_CARE_PROVIDER_SITE_OTHER): Payer: 59 | Admitting: Psychiatry

## 2018-10-15 ENCOUNTER — Encounter: Payer: Self-pay | Admitting: Psychiatry

## 2018-10-15 ENCOUNTER — Other Ambulatory Visit: Payer: Self-pay

## 2018-10-15 DIAGNOSIS — F5105 Insomnia due to other mental disorder: Secondary | ICD-10-CM | POA: Diagnosis not present

## 2018-10-15 DIAGNOSIS — F411 Generalized anxiety disorder: Secondary | ICD-10-CM

## 2018-10-15 MED ORDER — TRAZODONE HCL 50 MG PO TABS: 25.0000 mg | ORAL_TABLET | Freq: Every day | ORAL | 1 refills | Status: DC

## 2018-10-15 MED ORDER — HYDROXYZINE HCL 10 MG PO TABS: 10.0000 mg | ORAL_TABLET | Freq: Two times a day (BID) | ORAL | 0 refills | Status: DC | PRN

## 2018-10-15 NOTE — Progress Notes (Signed)
Virtual Visit via Video Note  I connected with Ebony Lawson on 10/15/18 at 10:00 AM EDT by a video enabled telemedicine application and verified that I am speaking with the correct person using two identifiers.   I discussed the limitations of evaluation and management by telemedicine and the availability of in person appointments. The patient expressed understanding and agreed to proceed.   I discussed the assessment and treatment plan with the patient. The patient was provided an opportunity to ask questions and all were answered. The patient agreed with the plan and demonstrated an understanding of the instructions.   The patient was advised to call back or seek an in-person evaluation if the symptoms worsen or if the condition fails to improve as anticipated.    [facility] MD OP Progress Note  10/15/2018 12:51 PM Ebony Lawson  MRN:  [id number]  Chief Complaint:  Chief Complaint    Follow-up     HPI: Ebony Lawson is a 33 year old Caucasian female, single, employed, lives in [place], has a history of GAD, insomnia was evaluated by telemedicine today.  Patient today reports she continues to have a good workload.  She reports work continues to be stressful however she has been coping okay.  Patient reports she continues to work third shift.  She reports sleep has okay so far on the current medication.  She does report feeling anxious prior to going to bed.  She wonders whether her hydroxyzine can be combined with the trazodone.  Patient currently takes trazodone 25 mg.  Discussed with patient that since she is on a very low dose of hydroxyzine she could either combine it or take it couple of hours prior to going to bed.  Discussed with her the side effects of hydroxyzine as well as trazodone.  She will monitor herself closely.  Patient reports her family is doing well.  She continues to have good support system.  Patient denies any suicidality, homicidality or perceptual  disturbances.     Visit Diagnosis:    ICD-10-CM   1. GAD (generalized anxiety disorder) F41.1 hydrOXYzine (ATARAX/VISTARIL) 10 MG tablet  2. Insomnia due to mental condition F51.05 traZODone (DESYREL) 50 MG tablet    Past Psychiatric History: Reviewed past psychiatric history from my progress note on 02/15/2018.  Past Medical History:  Past Medical History:  Diagnosis Date  . Heart murmur   . Migraine    No past surgical history on file.  Family Psychiatric History: Reviewed family psychiatric history from my progress note on 02/15/2018.  Family History:  Family History  Problem Relation Age of Onset  . Hyperlipidemia Mother   . Cancer Father        prostate and skin cancer  . Hyperlipidemia Father   . Heart disease Father        CAD - triple bypass 2014  . Hypertension Father   . Hearing loss Paternal Grandfather   . Hypertension Paternal Grandfather   . Anxiety disorder Brother     Social History: Reviewed social history from my progress note on 02/15/2018. Social History   Socioeconomic History  . Marital status: Single    Spouse name: Not on file  . Number of children: 0  . Years of education: 16  . Highest education level: Bachelor's degree (e.g., BA, AB, BS)  Occupational History  . Occupation: DNA Animal nutritionist: [employer]  . Financial resource strain: Not hard at all  . Food insecurity:    Worry: Never  true    Inability: Never true  . Transportation needs:    Medical: No    Non-medical: No  Tobacco Use  . Smoking status: Never Smoker  . Smokeless tobacco: Never Used  Substance and Sexual Activity  . Alcohol use: Yes    Alcohol/week: 0.0 standard drinks    Comment: 2-3 drinks monthly  . Drug use: No  . Sexual activity: Yes    Partners: Male    Birth control/protection: OCP    Comment: 1 partner   Lifestyle  . Physical activity:    Days per week: 4 days    Minutes per session: 50 min  . Stress: Very much   Relationships  . Social connections:    Talks on phone: Not on file    Gets together: Not on file    Attends religious service: More than 4 times per year    Active member of club or organization: Yes    Attends meetings of clubs or organizations: More than 4 times per year    Relationship status: Never married  Other Topics Concern  . Not on file  Social History Narrative   Hiyab grew up outside of [facility]. She attended [employer] and obtained a Manufacturing engineer in [employer] in [place]. 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   Exercise - none at present   Caffeine - rare     Allergies: No Known Allergies  Metabolic Disorder Labs: Lab Results  Component Value Date   HGBA1C 5.2 12/31/2014   No results found for: PROLACTIN Lab Results  Component Value Date   CHOL 139 04/03/2018   TRIG 94 04/03/2018   HDL 50 04/03/2018   LDLCALC 70 04/03/2018   LDLCALC 102 12/31/2014   Lab Results  Component Value Date   TSH 3.100 04/03/2018   TSH 2.220 02/19/2018    Therapeutic Level Labs: No results found for: LITHIUM No results found for: VALPROATE No components found for:  CBMZ  Current Medications: Current Outpatient Medications  Medication Sig Dispense Refill  . Adapalene 0.3 % gel Apply topically at bedtime.    . Calcium Carbonate-Vitamin D (CALCIUM 600+D3 PO) Take 1 tablet by mouth daily.    . drospirenone-ethinyl estradiol (YAZ,GIANVI,LORYNA) 3-0.02 MG tablet Take 1 tablet by mouth daily. 1 Package 11  . fluconazole (DIFLUCAN) 150 MG tablet Take 1 tablet (150 mg total) by mouth once. 1 tablet 0  . hydrOXYzine (ATARAX/VISTARIL) 10 MG tablet Take 1-2 tablets (10-20 mg total) by mouth 2 (two) times daily as needed. For severe anxiety 100 tablet 0  . Melatonin 10 MG TABS Take 1 tablet by mouth at bedtime as needed.    . Pediatric Multivit-Minerals-C (COMPLETE MULTI-VITAMIN) CHEW Chew 1 tablet by mouth daily. Gummy vitamin    . spironolactone  (ALDACTONE) 100 MG tablet     . traZODone (DESYREL) 50 MG tablet Take 0.5-1 tablets (25-50 mg total) by mouth at bedtime. insomnia 90 tablet 1  . tretinoin (RETIN-A) 0.025 % cream      No current facility-administered medications for this visit.      Musculoskeletal: Strength & Muscle Tone: within normal limits Gait & Station: normal Patient leans: N/A  Psychiatric Specialty Exam: Review of Systems  Psychiatric/Behavioral: The patient is nervous/anxious.   All other systems reviewed and are negative.   There were no vitals taken for this visit.There is no height or weight on file to calculate BMI.  General  Appearance: Casual  Eye Contact:  Fair  Speech:  Clear and Coherent  Volume:  Normal  Mood:  Anxious  Affect:  Appropriate  Thought Process:  Goal Directed and Descriptions of Associations: Intact  Orientation:  Full (Time, Place, and Person)  Thought Content: Logical   Suicidal Thoughts:  No  Homicidal Thoughts:  No  Memory:  Immediate;   Fair Recent;   Fair Remote;   Fair  Judgement:  Fair  Insight:  Fair  Psychomotor Activity:  Normal  Concentration:  Concentration: Fair and Attention Span: Fair  Recall:  [employer] of Knowledge: Fair  Language: Fair  Akathisia:  No  Handed:  Right  AIMS (if indicated):denies tremors, rigidity,stiffness  Assets:  Communication Skills Desire for Improvement Social Support  ADL's:  Intact  Cognition: WNL  Sleep:  Fair   Screenings: PHQ2-9     [facility] Visit from 03/21/2018 in [facility], [facility] Office Visit from 09/06/2017 in [facility], [facility]  PHQ-2 Total Score  0  0       Assessment and Plan: Isamar is a 33 year old Caucasian female, single, employed, lives in [place], has a history of anxiety, sleep problems was evaluated by telemedicine today.  Patient is biologically predisposed given her family history of mental health problems.  She also has psychosocial stressors of the COVID-19 crisis.   Patient however is currently making progress.  Plan as noted below.  Plan Generalized anxiety disorder-some progress Continue CBT Continue hydroxyzine 10-20 mg p.o. daily as needed. Does have Klonopin 0.25 mg p.o. daily as needed available however has not been using it.  For insomnia-improving Trazodone 25 to 50 mg p.o. nightly. Melatonin 1 to 2 mg p.o. daily. Hydroxyzine as needed.  She will continue CBT with her therapist as needed.  Follow-up in clinic in 1 month or sooner if needed.  Appointment scheduled for June 25 at 10:30 in the morning.  I have spent atleast 15 minutes non face to face with patient today. More than 50 % of the time was spent for psychoeducation and supportive psychotherapy and care coordination.  This note was generated in part or whole with voice recognition software. Voice recognition is usually quite accurate but there are transcription errors that can and very often do occur. I apologize for any typographical errors that were not detected and corrected.        Ursula Alert, MD 10/15/2018, 12:51 PM

## 2018-10-30 ENCOUNTER — Other Ambulatory Visit: Payer: Self-pay

## 2018-10-30 ENCOUNTER — Ambulatory Visit (INDEPENDENT_AMBULATORY_CARE_PROVIDER_SITE_OTHER): Payer: 59 | Admitting: Licensed Clinical Social Worker

## 2018-10-30 ENCOUNTER — Encounter: Payer: Self-pay | Admitting: Licensed Clinical Social Worker

## 2018-10-30 DIAGNOSIS — F411 Generalized anxiety disorder: Secondary | ICD-10-CM

## 2018-10-30 NOTE — Progress Notes (Signed)
Virtual Visit via Video Note  I connected with Kelly Charrette on 10/30/18 at  8:00 AM EDT by a video enabled telemedicine application and verified that I am speaking with the correct person using two identifiers.   I discussed the limitations of evaluation and management by telemedicine and the availability of in person appointments. The patient expressed understanding and agreed to proceed.   I discussed the assessment and treatment plan with the patient. The patient was provided an opportunity to ask questions and all were answered. The patient agreed with the plan and demonstrated an understanding of the instructions.   The patient was advised to call back or seek an in-person evaluation if the symptoms worsen or if the condition fails to improve as anticipated.  I provided 50 minutes of non-face-to-face time during this encounter.   Alden Hipp, LCSW    THERAPIST PROGRESS NOTE  Session Time: 0800  Participation Level: Active  Behavioral Response: CasualAlertAnxious  Type of Therapy: Individual Therapy  Treatment Goals addressed: Anxiety  Interventions: Supportive  Summary: Sheryle Vice is a 33 y.o. female who presents with continued symptoms around her diagnosis. Lasheena reports doing well since our last session. She reports she has had a few moments of panic but was able to "talk myself out of it." She reported utilizing thought challenging techniques and was able to stop the panic before it got worse. LCSW validated Clovia's ability to utilize the skills we've been building and highlighted Aashvi's progress. Dannette was able to recognize her progress as well and expressed understanding of how to continue using CBT to manage anxiety symptoms. Kyndall reported her primary issue over the last two weeks has been her boyfriend telling her at the last minute he was not ready to move in together. Noeli reported they were discussing the application he needed to fill out, and they had been  making plans moving forward with moving in. He then told her that he did not want to leave his roommates hanging with the rent. Sarabeth was able to utilize assertive communication in that moment to tell him she felt he was choosing his roommates over her. LCSW praised Tarissa's use of assertive communication to alert her boyfriend of her feelings. Myalee reported it was then her boyfriend informed her he was using that as an excuse and he didn't feel ready to move in together. 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 And, he said he's not ready--so when will he be ready? We've been together for four and a half years." LCSW validated and normalized Ayala's feelings and encouraged her to bring these questions back up with him. LCSW highlighted the ways Azjah's boyfriend was unable to communicate effectively, and therefore left Saranya wondering about his thoughts and intentions. LCSW encouraged Lorene to recognize her feelings and thoughts are valid, and she has the right to feel how she feels. Maghan expressed understanding and agreement with this idea, and stated she planned to speak with him further.   Suicidal/Homicidal: No  Therapist Response: Angalina continues to work towards her tx goals but has not yet reached them. We will continue to work on assertiveness skills and emotional regulation skills moving forward.   Plan: Return again in 3 weeks.  Diagnosis: Axis I: Generalized Anxiety Disorder    Axis II: No diagnosis    Alden Hipp, LCSW 10/30/2018

## 2018-11-05 ENCOUNTER — Ambulatory Visit: Payer: Managed Care, Other (non HMO) | Admitting: Nurse Practitioner

## 2018-11-05 ENCOUNTER — Encounter: Payer: Self-pay | Admitting: Nurse Practitioner

## 2018-11-05 ENCOUNTER — Other Ambulatory Visit: Payer: Self-pay

## 2018-11-05 VITALS — BP 102/60 | HR 89 | Resp 16 | Ht 60.0 in | Wt 89.2 lb

## 2018-11-05 DIAGNOSIS — Z30011 Encounter for initial prescription of contraceptive pills: Secondary | ICD-10-CM | POA: Diagnosis not present

## 2018-11-05 DIAGNOSIS — R87820 Cervical low risk human papillomavirus (HPV) DNA test positive: Secondary | ICD-10-CM

## 2018-11-05 DIAGNOSIS — Z124 Encounter for screening for malignant neoplasm of cervix: Secondary | ICD-10-CM

## 2018-11-05 MED ORDER — DROSPIRENONE-ETHINYL ESTRADIOL 3-0.02 MG PO TABS: 1.0000 | ORAL_TABLET | Freq: Every day | ORAL | 3 refills | Status: DC

## 2018-11-05 NOTE — Progress Notes (Signed)
[facility] [place], [place] [address]  Internal MEDICINE  Office Visit Note  Patient Name: Ebony Lawson  [id number]  [id number]  Date of Service: 11/12/2018    Pt is here for a pap smear  Chief Complaint  Patient presents with  . Medical Management of Chronic Issues    6 month follow up  . Migraine  . Gynecologic Exam     The patient is here for fllow up of abnormal pap smear. She has long hisrory of HPV positive PAP. She has consulted with GYN in the past. Was told there is no evidence of cancerous changes and she is ok to continue rutine screenings every 6 months unless changes occur. Her last pap smear was 03/2018 and was negative for lesion or malignancy but was positive for HPV.      Current Medication: Outpatient Encounter Medications as of 11/05/2018  Medication Sig  . Adapalene 0.3 % gel Apply topically at bedtime.  . Calcium Carbonate-Vitamin D (CALCIUM 600+D3 PO) Take 1 tablet by mouth daily.  . drospirenone-ethinyl estradiol (YAZ) 3-0.02 MG tablet Take 1 tablet by mouth daily.  . hydrOXYzine (ATARAX/VISTARIL) 10 MG tablet Take 1-2 tablets (10-20 mg total) by mouth 2 (two) times daily as needed. For severe anxiety  . Melatonin 10 MG TABS Take 1 tablet by mouth at bedtime as needed.  Marland Kitchen spironolactone (ALDACTONE) 100 MG tablet   . traZODone (DESYREL) 50 MG tablet Take 0.5-1 tablets (25-50 mg total) by mouth at bedtime. insomnia  . tretinoin (RETIN-A) 0.025 % cream   . [DISCONTINUED] drospirenone-ethinyl estradiol (YAZ,GIANVI,LORYNA) 3-0.02 MG tablet Take 1 tablet by mouth daily.  . [DISCONTINUED] fluconazole (DIFLUCAN) 150 MG tablet Take 1 tablet (150 mg total) by mouth once. (Patient not taking: Reported on 11/05/2018)  . [DISCONTINUED] Pediatric Multivit-Minerals-C (COMPLETE MULTI-VITAMIN) CHEW Chew 1 tablet by mouth daily. Gummy vitamin   No facility-administered encounter medications on file as of 11/05/2018.     Surgical  History: History reviewed. No pertinent surgical history.  Medical History: Past Medical History:  Diagnosis Date  . Heart murmur   . Migraine     Family History: Family History  Problem Relation Age of Onset  . Hyperlipidemia Mother   . Cancer Father        prostate and skin cancer  . Hyperlipidemia Father   . Heart disease Father        CAD - triple bypass 2014  . Hypertension Father   . Hearing loss Paternal Grandfather   . Hypertension Paternal Grandfather   . Anxiety disorder Brother     Social History   Socioeconomic History  . Marital status: Single    Spouse name: Not on file  . Number of children: 0  . Years of education: 16  . Highest education level: Bachelor's degree (e.g., BA, AB, BS)  Occupational History  . Occupation: DNA Animal nutritionist: [employer]  . Financial resource strain: Not hard at all  . Food insecurity:    Worry: Never true    Inability: Never true  . Transportation needs:    Medical: No    Non-medical: No  Tobacco Use  . Smoking status: Never Smoker  . Smokeless tobacco: Never Used  Substance and Sexual Activity  . Alcohol use: Yes    Alcohol/week: 0.0 standard drinks    Comment: 2-3 drinks monthly  . Drug use: No  . Sexual activity: Yes    Partners: Male  Birth control/protection: OCP    Comment: 1 partner   Lifestyle  . Physical activity:    Days per week: 4 days    Minutes per session: 50 min  . Stress: Very much  Relationships  . Social connections:    Talks on phone: Not on file    Gets together: Not on file    Attends religious service: More than 4 times per year    Active member of club or organization: Yes    Attends meetings of clubs or organizations: More than 4 times per year    Relationship status: Never married  . Intimate partner violence:    Fear of current or ex partner: No    Emotionally abused: No    Physically abused: No    Forced sexual activity: No  Other Topics  Concern  . Not on file  Social History Narrative   Keamber grew up outside of [facility]. She attended [employer] and obtained a Manufacturing engineer in [employer] in [place]. 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  HENT: Negative for congestion, postnasal drip, rhinorrhea, sneezing and sore throat.   Respiratory: Negative for cough, chest tightness, shortness of breath and wheezing.   Cardiovascular: Negative for chest pain and palpitations.  Gastrointestinal: Negative for abdominal pain, constipation, diarrhea, nausea and vomiting.  Endocrine: Negative for cold intolerance, heat intolerance, polydipsia and polyuria.  Genitourinary: Negative for dysuria and frequency.       Would like to restart oral contraceptives. Helping regulate cycles and helping facial acne.  Musculoskeletal: Negative for back pain, joint swelling and neck pain.  Skin: Negative for rash.       Facial acne  Allergic/Immunologic: Negative for environmental allergies.  Neurological: Negative for dizziness, tremors, numbness and headaches.  Hematological: Negative for adenopathy. Does not bruise/bleed easily.  Psychiatric/Behavioral: Positive for dysphoric mood. Negative for behavioral problems (Depression), sleep disturbance and suicidal ideas. The patient is not nervous/anxious.        Sees psychiatry and counseling at [place]   11/05/18 1103  BP: 102/60  Pulse: 89  Resp: 16  SpO2: 100%  Weight: 89 lb 3.2 oz (40.5 kg)  Height: 5' (1.524 m)   Body mass index is 17.42 kg/m.  Physical Exam Vitals signs and nursing note reviewed.  Constitutional:      General: She is not in acute distress.    Appearance: Normal appearance. She is well-developed. She is not diaphoretic.  HENT:     Head: Normocephalic and  atraumatic.     Nose: Nose normal.     Mouth/Throat:     Pharynx: No oropharyngeal exudate.  Eyes:     Conjunctiva/sclera: Conjunctivae normal.     Pupils: Pupils are equal, round, and reactive to light.  Neck:     Musculoskeletal: Normal range of motion and neck supple.     Thyroid: No thyromegaly.     Vascular: No JVD.     Trachea: No tracheal deviation.  Cardiovascular:     Rate and Rhythm: Normal rate and regular rhythm.     Heart sounds: Normal heart sounds. No murmur. No friction rub. No gallop.   Pulmonary:     Effort: Pulmonary effort is normal. No respiratory distress.  Breath sounds: Normal breath sounds. No wheezing or rales.  Chest:     Chest wall: No tenderness.  Abdominal:     General: Bowel sounds are normal.     Palpations: Abdomen is soft.     Tenderness: There is no abdominal tenderness.     Hernia: A hernia is present. There is no hernia in the right inguinal area or left inguinal area.  Genitourinary:    General: Normal vulva.     Exam position: Supine.     Vagina: Normal.     Comments: Tenderness with insertion of speculum.  Musculoskeletal: Normal range of motion.  Lymphadenopathy:     Cervical: No cervical adenopathy.     Lower Body: No right inguinal adenopathy. No left inguinal adenopathy.  Skin:    General: Skin is warm and dry.     Capillary Refill: Capillary refill takes less than 2 seconds.     Comments: Mild/moderate facial acne   Neurological:     Mental Status: She is alert and oriented to person, place, and time.     Cranial Nerves: No cranial nerve deficit.  Psychiatric:        Behavior: Behavior normal.        Thought Content: Thought content normal.        Judgment: Judgment normal.    Assessment/Plan: 1. 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 Repeat pap smear done today.   2. Routine cervical smear - Pap IG and HPV (high risk)  DNA detection  3. Encounter for prescription of oral contraceptives Refill birth control pills today.  - drospirenone-ethinyl estradiol (YAZ) 3-0.02 MG tablet; Take 1 tablet by mouth daily.  Dispense: 3 Package; Refill: 3  General Counseling: Zahli verbalizes understanding of the findings of todays visit and agrees with plan of treatment. I have discussed any further diagnostic evaluation that may be needed or ordered today. We also reviewed her medications today. she has been encouraged to call the office with any questions or concerns that should arise related to todays visit.    Counseling:  This patient was seen by [facility] with Dr Lavera Guise as a part of collaborative care agreement  Meds ordered this encounter  Medications  . drospirenone-ethinyl estradiol (YAZ) 3-0.02 MG tablet    Sig: Take 1 tablet by mouth daily.    Dispense:  3 Package    Refill:  3    Order Specific Question:   Supervising Provider    Answer:   Lavera Guise [[id number]]    Time spent: 25 Minutes

## 2018-11-14 ENCOUNTER — Encounter: Payer: Self-pay | Admitting: Licensed Clinical Social Worker

## 2018-11-14 ENCOUNTER — Other Ambulatory Visit: Payer: Self-pay

## 2018-11-14 ENCOUNTER — Ambulatory Visit (INDEPENDENT_AMBULATORY_CARE_PROVIDER_SITE_OTHER): Payer: 59 | Admitting: Licensed Clinical Social Worker

## 2018-11-14 DIAGNOSIS — F411 Generalized anxiety disorder: Secondary | ICD-10-CM | POA: Diagnosis not present

## 2018-11-14 LAB — PAP IG AND HPV HIGH-RISK: HPV, high-risk: POSITIVE — AB

## 2018-11-14 NOTE — Progress Notes (Signed)
Virtual Visit via Video Note  I connected with Ebony Lawson on 11/14/18 at  8:00 AM EDT by a video enabled telemedicine application and verified that I am speaking with the correct person using two identifiers.   I discussed the limitations of evaluation and management by telemedicine and the availability of in person appointments. The patient expressed understanding and agreed to proceed.    I discussed the assessment and treatment plan with the patient. The patient was provided an opportunity to ask questions and all were answered. The patient agreed with the plan and demonstrated an understanding of the instructions.   The patient was advised to call back or seek an in-person evaluation if the symptoms worsen or if the condition fails to improve as anticipated.  I provided 45 minutes of non-face-to-face time during this encounter.   Alden Hipp, LCSW    THERAPIST PROGRESS NOTE  Session Time: 0800  Participation Level: Active  Behavioral Response: NeatAlertAnxious  Type of Therapy: Individual Therapy  Treatment Goals addressed: Anxiety  Interventions: CBT  Summary: Ebony Lawson is a 33 y.o. female who presents with continued symptoms related to her diagnosis. Ebony Lawson reports doing well since our last session. She reports she has had ongoing anxiety around her relationship with her boyfriend. Following their argument about moving in together, her boyfriend did not respond to her text messages for a few days. When they finally talked, he reported being unaware he was ignoring her and stated that was not his intention. Ebony Lawson expressed her frustration around the situation, and asked him if they could communicate more regularly to avoid her feeling disconnected. Her boyfriend agreed. However, Ebony Lawson reported feeling like she "chickened out," of talking to him about their relationship and what direction they are moving in. LCSW validated and normalized that feeling, and encouraged her  to find time to discuss it when she felt comfortable, and perhaps not on the heels of a another tedious moment. Ebony Lawson expressed understanding and agreement. Ebony Lawson reported continued anxiety around her cat's health, but reported the cat is going to the vet today to determine if she is allergic to her food. Ebony Lawson reports planning to start food trials, which LCSW highlighted as Ebony Lawson controlling the parts of the situation she can. Ebony Lawson was able to recognize this as well. Ebony Lawson expressed work is going well, though increased work load with less workers has become tiring. LCSW validated and normalized this idea, and encouraged Ebony Lawson to allow herself to feel frustrated and not invalidate herself by saying, "at least I still have a job." Ebony Lawson reported, "yeah, that's true."    Suicidal/Homicidal: No  Therapist Response: Ebony Lawson continues to work towards her tx goals but has not yet reached them. We will continue to work on challenging negative thoughts via CBT, and improving comfort level with assertive communication.   Plan: Return again in 3 weeks.  Diagnosis: Axis I: Generalized Anxiety Disorder    Axis II: No diagnosis    Alden Hipp, LCSW 11/14/2018

## 2018-11-28 ENCOUNTER — Ambulatory Visit (INDEPENDENT_AMBULATORY_CARE_PROVIDER_SITE_OTHER): Payer: 59 | Admitting: Psychiatry

## 2018-11-28 ENCOUNTER — Encounter: Payer: Self-pay | Admitting: Psychiatry

## 2018-11-28 ENCOUNTER — Other Ambulatory Visit: Payer: Self-pay

## 2018-11-28 DIAGNOSIS — F411 Generalized anxiety disorder: Secondary | ICD-10-CM | POA: Diagnosis not present

## 2018-11-28 DIAGNOSIS — F5105 Insomnia due to other mental disorder: Secondary | ICD-10-CM | POA: Diagnosis not present

## 2018-11-28 NOTE — Progress Notes (Signed)
Virtual Visit via Video Note  I connected with Ebony Lawson on 11/28/18 at 10:30 AM EDT by a video enabled telemedicine application and verified that I am speaking with the correct person using two identifiers.   I discussed the limitations of evaluation and management by telemedicine and the availability of in person appointments. The patient expressed understanding and agreed to proceed.    I discussed the assessment and treatment plan with the patient. The patient was provided an opportunity to ask questions and all were answered. The patient agreed with the plan and demonstrated an understanding of the instructions.   The patient was advised to call back or seek an in-person evaluation if the symptoms worsen or if the condition fails to improve as anticipated.   White MD OP Progress Note  11/28/2018 1:49 PM Ebony Lawson  MRN:  [id number]  Chief Complaint:  Chief Complaint    Follow-up     HPI: Ebony Lawson is a 33 year old Caucasian female, single, employed, lives in [place], has a history of GAD, insomnia was evaluated by telemedicine today.  Patient today reports she continues to do well with regards to her mood symptoms.  She reports she does not have any significant anxiety at this time.  She denies any significant sleep problems.  Patient reports she is compliant on her medications.  She denies side effects.   Visit Diagnosis:    ICD-10-CM   1. 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  Past Medical History:  Past Medical History:  Diagnosis Date  . Heart murmur   . Migraine    History reviewed. No pertinent surgical history.  Family Psychiatric History: Reviewed family psychiatric history from my progress note on 02/15/2018  Family History:  Family History  Problem Relation Age of Onset  . Hyperlipidemia Mother   .  Cancer Father        prostate and skin cancer  . Hyperlipidemia Father   . Heart disease Father        CAD - triple bypass 2014  . Hypertension Father   . Hearing loss Paternal Grandfather   . Hypertension Paternal Grandfather   . Anxiety disorder Brother     Social History: Reviewed social history from my progress note on 02/15/2018 Social History   Socioeconomic History  . Marital status: Single    Spouse name: Not on file  . Number of children: 0  . Years of education: 16  . Highest education level: Bachelor's degree (e.g., BA, AB, BS)  Occupational History  . Occupation: DNA Animal nutritionist: [employer]  . Financial resource strain: Not hard at all  . Food insecurity    Worry: Never true    Inability: Never true  . Transportation needs    Medical: No    Non-medical: No  Tobacco Use  . Smoking status: Never Smoker  . Smokeless tobacco: Never Used  Substance and Sexual Activity  . Alcohol use: Yes    Alcohol/week: 0.0 standard drinks    Comment: 2-3 drinks monthly  . Drug use: No  . Sexual activity: Yes    Partners: Male    Birth control/protection: OCP    Comment: 1 partner   Lifestyle  . Physical activity    Days per week: 4 days    Minutes per session: 50 min  .  Stress: Very much  Relationships  . Social Herbalist on phone: Not on file    Gets together: Not on file    Attends religious service: More than 4 times per year    Active member of club or organization: Yes    Attends meetings of clubs or organizations: More than 4 times per year    Relationship status: Never married  Other Topics Concern  . Not on file  Social History Narrative   Ebony Lawson grew up outside of [facility]. 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   Exercise - none at present   Caffeine - rare     Allergies: No Known  Allergies  Metabolic Disorder Labs: Lab Results  Component Value Date   HGBA1C 5.2 12/31/2014   No results found for: PROLACTIN Lab Results  Component Value Date   CHOL 139 04/03/2018   TRIG 94 04/03/2018   HDL 50 04/03/2018   LDLCALC 70 04/03/2018   LDLCALC 102 12/31/2014   Lab Results  Component Value Date   TSH 3.100 04/03/2018   TSH 2.220 02/19/2018    Therapeutic Level Labs: No results found for: LITHIUM No results found for: VALPROATE No components found for:  CBMZ  Current Medications: Current Outpatient Medications  Medication Sig Dispense Refill  . Adapalene 0.3 % gel Apply topically at bedtime.    . Calcium Carbonate-Vitamin D (CALCIUM 600+D3 PO) Take 1 tablet by mouth daily.    . drospirenone-ethinyl estradiol (YAZ) 3-0.02 MG tablet Take 1 tablet by mouth daily. 3 Package 3  . hydrOXYzine (ATARAX/VISTARIL) 10 MG tablet Take 1-2 tablets (10-20 mg total) by mouth 2 (two) times daily as needed. For severe anxiety 100 tablet 0  . Melatonin 10 MG TABS Take 1 tablet by mouth at bedtime as needed.    Marland Kitchen spironolactone (ALDACTONE) 100 MG tablet     . traZODone (DESYREL) 50 MG tablet Take 0.5-1 tablets (25-50 mg total) by mouth at bedtime. insomnia 90 tablet 1  . tretinoin (RETIN-A) 0.025 % cream      No current facility-administered medications for this visit.      Musculoskeletal: Strength & Muscle Tone: within normal limits Gait & Station: normal Patient leans: N/A  Psychiatric Specialty Exam: Review of Systems  Psychiatric/Behavioral: The patient is not nervous/anxious.   All other systems reviewed and are negative.   Last menstrual period 10/29/2018.There is no height or weight on file to calculate BMI.  General Appearance: Casual  Eye Contact:  Fair  Speech:  Clear and Coherent  Volume:  Normal  Mood:  Euthymic  Affect:  Congruent  Thought Process:  Goal Directed and Descriptions of Associations: Intact  Orientation:  Full (Time, Place, and Person)   Thought Content: Logical   Suicidal Thoughts:  No  Homicidal Thoughts:  No  Memory:  Immediate;   Fair Recent;   Fair Remote;   Fair  Judgement:  Fair  Insight:  Fair  Psychomotor Activity:  Normal  Concentration:  Concentration: Fair and Attention Span: Fair  Recall:  [employer] of Knowledge: Fair  Language: Fair  Akathisia:  No  Handed:  Right  AIMS (if indicated): denies tremors, rigidity  Assets:  Communication Skills Desire for Improvement Social Support Transportation  ADL's:  Intact  Cognition: WNL  Sleep:  Fair   Screenings: PHQ2-9  Office Visit from 11/05/2018 in [facility], [facility] Office Visit from 03/21/2018 in [facility], [facility] Office Visit from 09/06/2017 in [facility], [facility]  PHQ-2 Total Score  0  0  0       Assessment and Plan: Ebony Lawson is a 33 year old Caucasian female, single, employed, lives in [place], has a history of anxiety, sleep problems was evaluated by telemedicine today.  Patient is biologically predisposed given her family history of mental health problems.  She also has psychosocial stressors of COVID-19 however is coping okay.  Plan as noted below.  Plan GAD-stable Continue CBT Hydroxyzine 10 to 20 mg p.o. daily as needed Klonopin 0.25 mg p.o. daily as needed as needed.  For insomnia-stable Trazodone 25 to 50 mg p.o. nightly  Follow-up in clinic in 3 months or sooner if needed.  Appointment scheduled for September 29 at 10 AM  I have spent atleast 15 minutes non face to face with patient today. More than 50 % of the time was spent for psychoeducation and supportive psychotherapy and care coordination.  This note was generated in part or whole with voice recognition software. Voice recognition is usually quite accurate but there are transcription errors that can and very often do occur. I apologize for any typographical errors that were not detected and corrected.        Ursula Alert,  MD 11/28/2018, 1:49 PM

## 2018-12-09 ENCOUNTER — Other Ambulatory Visit: Payer: Self-pay

## 2018-12-09 ENCOUNTER — Encounter: Payer: Self-pay | Admitting: Licensed Clinical Social Worker

## 2018-12-09 ENCOUNTER — Ambulatory Visit (INDEPENDENT_AMBULATORY_CARE_PROVIDER_SITE_OTHER): Payer: 59 | Admitting: Licensed Clinical Social Worker

## 2018-12-09 DIAGNOSIS — F411 Generalized anxiety disorder: Secondary | ICD-10-CM

## 2018-12-09 NOTE — Progress Notes (Signed)
Virtual Visit via Video Note  I connected with Ebony Lawson on 12/09/18 at  8:00 AM EDT by a video enabled telemedicine application and verified that I am speaking with the correct person using two identifiers.   I discussed the limitations of evaluation and management by telemedicine and the availability of in person appointments. The patient expressed understanding and agreed to proceed.  I discussed the assessment and treatment plan with the patient. The patient was provided an opportunity to ask questions and all were answered. The patient agreed with the plan and demonstrated an understanding of the instructions.   The patient was advised to call back or seek an in-person evaluation if the symptoms worsen or if the condition fails to improve as anticipated.  I provided 50 minutes of non-face-to-face time during this encounter.   Alden Hipp, LCSW    THERAPIST PROGRESS NOTE  Session Time: 0800  Participation Level: Active  Behavioral Response: CasualAlertAnxious  Type of Therapy: Individual Therapy  Treatment Goals addressed: Anxiety  Interventions: CBT  Summary: Ebony Lawson is a 33 y.o. female who presents with continued symptoms related to her diagnosis. Ebony Lawson reports doing well since our last session. She reports starting the process of moving into her new apartment. We discussed ways to manage anxiety around the move, including focusing on the parts she can control. Ebony Lawson reported work is going well, but she is hoping for a promotion soon. She discussed feeling anxious about her relationship, and not being sure when to discuss her concerns with her boyfriend. LCSW encouraged Ebony Lawson to do this sooner rather than later, so she does not become resentful. Ebony Lawson expressed understanding and agreement. Ebony Lawson stated she has been having difficulty slowing her mind down in the evenings. We discussed distraction techniques to assist with this issue. Finally, Ebony Lawson reported feeling  some anxiety around her cat's health conditions. We discussed the right to get a second opinion, and that she is also allowed to ask as many questions as she wants when going to the vet. We discussed her hesitancy to do so, and how she could manage anxiety around that in the moment.   Suicidal/Homicidal: No  Therapist Response: Ebony Lawson continues to work towards her tx goals but has not yet reached them. We will continue to work on emotional regulation skills moving forward and will work on challenging negative thoughts.   Plan: Return again in 4 weeks.  Diagnosis: Axis I: Generalized Anxiety Disorder    Axis II: No diagnosis    Alden Hipp, LCSW 12/09/2018

## 2019-01-07 ENCOUNTER — Ambulatory Visit (INDEPENDENT_AMBULATORY_CARE_PROVIDER_SITE_OTHER): Payer: 59 | Admitting: Licensed Clinical Social Worker

## 2019-01-07 ENCOUNTER — Other Ambulatory Visit: Payer: Self-pay

## 2019-01-07 ENCOUNTER — Encounter: Payer: Self-pay | Admitting: Licensed Clinical Social Worker

## 2019-01-07 DIAGNOSIS — F411 Generalized anxiety disorder: Secondary | ICD-10-CM

## 2019-01-07 NOTE — Progress Notes (Signed)
Virtual Visit via Video Note  I connected with Ebony Lawson on 01/07/19 at  8:00 AM EDT by a video enabled telemedicine application and verified that I am speaking with the correct person using two identifiers.   I discussed the limitations of evaluation and management by telemedicine and the availability of in person appointments. The patient expressed understanding and agreed to proceed.    I discussed the assessment and treatment plan with the patient. The patient was provided an opportunity to ask questions and all were answered. The patient agreed with the plan and demonstrated an understanding of the instructions.   The patient was advised to call back or seek an in-person evaluation if the symptoms worsen or if the condition fails to improve as anticipated.  I provided 60 minutes of non-face-to-face time during this encounter.   Ebony Lawson, Ebony    THERAPIST PROGRESS NOTE  Session Time: 0800  Participation Level: Active  Behavioral Response: CasualAlertAnxious  Type of Therapy: Individual Therapy  Treatment Goals addressed: Anxiety  Interventions: CBT  Summary: Ebony Lawson is a 33 y.o. female who presents with continued symptoms related to her diagnosis. Ebony Lawson reports doing well since our last session. She reports she is settling into her new apartment well, and is excited to be in her new place. She reports she is feeling slightly anxious about not having everything unpacked yet, "I feel guilty when I'm just chilling and thinking that I should be doing other things." Ebony encouraged Ebony Lawson to give herself down time in order to be more productive in her free time, but also allow herself to recharge during down time. Ebony encouraged Ebony Lawson to challenge those anxious thoughts when they come up by utilizing CBT skills. Ebony Lawson expressed understanding and agreement. 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 She reported feeling  excited about her promotion but not as excited about the change. We discussed how difficult change can be for individuals with anxiety. Ebony Lawson to take things as they come, and try not to think too far in advanced. We reviewed how to challenge negative thoughts in order to avoid worrying about things that have not yet happened. Ebony Lawson expressed understanding and agreement.   Suicidal/Homicidal: No  Therapist Response: Ebony Lawson continues to work towards her tx goals but has not yet reached them. We will continue to work on emotional regulation and improving CBT skills moving forward.   Plan: Return again in 4 weeks.  Diagnosis: Axis I: Generalized Anxiety Disorder    Axis II: No diagnosis    Ebony Lawson, Ebony 01/07/2019

## 2019-02-10 ENCOUNTER — Ambulatory Visit: Payer: 59 | Admitting: Licensed Clinical Social Worker

## 2019-02-11 ENCOUNTER — Ambulatory Visit: Payer: 59 | Admitting: Licensed Clinical Social Worker

## 2019-02-12 ENCOUNTER — Other Ambulatory Visit: Payer: Self-pay

## 2019-02-12 ENCOUNTER — Encounter: Payer: Self-pay | Admitting: Licensed Clinical Social Worker

## 2019-02-12 ENCOUNTER — Ambulatory Visit (INDEPENDENT_AMBULATORY_CARE_PROVIDER_SITE_OTHER): Payer: 59 | Admitting: Licensed Clinical Social Worker

## 2019-02-12 DIAGNOSIS — F411 Generalized anxiety disorder: Secondary | ICD-10-CM | POA: Diagnosis not present

## 2019-02-12 NOTE — Progress Notes (Signed)
Virtual Visit via Video Note  I connected with Israa Delcastillo on 02/12/19 at 12:30 PM EDT by a video enabled telemedicine application and verified that I am speaking with the correct person using two identifiers.   I discussed the limitations of evaluation and management by telemedicine and the availability of in person appointments. The patient expressed understanding and agreed to proceed. I discussed the assessment and treatment plan with the patient. The patient was provided an opportunity to ask questions and all were answered. The patient agreed with the plan and demonstrated an understanding of the instructions.   The patient was advised to call back or seek an in-person evaluation if the symptoms worsen or if the condition fails to improve as anticipated.  I provided 55 minutes of non-face-to-face time during this encounter.   Alden Hipp, LCSW    THERAPIST PROGRESS NOTE  Session Time: 1230  Participation Level: Active  Behavioral Response: NeatAlertAnxious  Type of Therapy: Individual Therapy  Treatment Goals addressed: Anxiety  Interventions: Supportive  Summary: Loriann Wessler is a 33 y.o. female who presents with continued symptoms related to her diagnosis. Cardelia reports doing well since our last session, but added several moments of high anxiety. She reports she is half in her old position and half in her new position at work, which is making her feel increased anxiety. "I feel like I'm doing a lot of stuff but not doing any of it well." She went on to note she felt she had a lot of questions and wasn't going to know what she was doing when she started her new job. We discussed expectations, and how likely she will not be expected to know 100% of everything the moment she steps into the new position. Bhumi was able to recognize this and agreed. We discussed several ways she could find answers to her questions, and discussed how she could manage anxiety in the new role  moving forward. We went on to discussing her relationship. She reported she and her boyfriend broke up via email. She stated she did not hear from him for a week, after many unanswered text messages. Because of this, she emailed his work Leisure centre manager, and he replied breaking up with her. She stated she felt she should be more upset, but she isn't. We discussed processing this event, and how to proceed. LCSW encouraged Anarosa to make a list of qualities in a significant other she would like moving forward. Courney expressed understanding and agreement.   Suicidal/Homicidal: No  Therapist Response: Rayyan continues to work towards her tx goals but has not yet reached them. We will continue to work on challenging negative thoughts and improving CBT skills moving forward.   Plan: Return again in 4 weeks.  Diagnosis: Axis I: Generalized Anxiety Disorder    Axis II: No diagnosis    Alden Hipp, LCSW 02/12/2019

## 2019-02-24 ENCOUNTER — Ambulatory Visit (INDEPENDENT_AMBULATORY_CARE_PROVIDER_SITE_OTHER): Payer: 59 | Admitting: Psychiatry

## 2019-02-24 ENCOUNTER — Encounter: Payer: Self-pay | Admitting: Psychiatry

## 2019-02-24 ENCOUNTER — Other Ambulatory Visit: Payer: Self-pay

## 2019-02-24 DIAGNOSIS — F411 Generalized anxiety disorder: Secondary | ICD-10-CM

## 2019-02-24 DIAGNOSIS — F5105 Insomnia due to other mental disorder: Secondary | ICD-10-CM | POA: Diagnosis not present

## 2019-02-24 MED ORDER — HYDROXYZINE HCL 10 MG PO TABS: 10.0000 mg | ORAL_TABLET | Freq: Two times a day (BID) | ORAL | 0 refills | Status: DC | PRN

## 2019-02-24 NOTE — Progress Notes (Signed)
Virtual Visit via Video Note  I connected with Ebony Lawson on 02/24/19 at  4:15 PM EDT by a video enabled telemedicine application and verified that I am speaking with the correct person using two identifiers.   I discussed the limitations of evaluation and management by telemedicine and the availability of in person appointments. The patient expressed understanding and agreed to proceed.     I discussed the assessment and treatment plan with the patient. The patient was provided an opportunity to ask questions and all were answered. The patient agreed with the plan and demonstrated an understanding of the instructions.   The patient was advised to call back or seek an in-person evaluation if the symptoms worsen or if the condition fails to improve as anticipated.  [place] MD OP Progress Note  02/24/2019 5:07 PM Ebony Lawson  MRN:  [id number]  Chief Complaint:  Chief Complaint    Follow-up     HPI: Ebony Lawson is a 33 year old Caucasian female, single, employed, lives in [place], has a history of GAD, insomnia was evaluated by telemedicine today.  Patient today reports she is currently doing better with regards to her anxiety symptoms.  She reports she takes the hydroxyzine as needed very rarely.  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  She reports she got a promotion and she is excited about the same.  She however reports that she is going to change her shift at work to day time and that is a transition for her since she will have to start sleeping at night.  She does have trazodone available.  Discussed melatonin with her also.  Patient continues to work with Ms. Alden Hipp her counselor and reports therapy sessions is going well.  Patient denies any suicidality, homicidality or perceptual disturbances. Visit Diagnosis:    ICD-10-CM   1. 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 Heart murmur   . Migraine    History reviewed. No pertinent surgical history.  Family Psychiatric History: I have reviewed family psychiatric history from my progress note on 02/15/2018  Family History:  Family History  Problem Relation Age of Onset  . Hyperlipidemia Mother   . Cancer Father        prostate and skin cancer  . Hyperlipidemia Father   . Heart disease Father        CAD - triple bypass 2014  . Hypertension Father   . Hearing loss Paternal Grandfather   . Hypertension Paternal Grandfather   . Anxiety disorder Brother     Social History: I have reviewed social history from my progress note on 02/15/2018 Social History   Socioeconomic History  . Marital status: Single    Spouse name: Not on file  . Number of children: 0  . Years of education: 16  . Highest education level: Bachelor's degree (e.g., BA, AB, BS)  Occupational History  . Occupation: DNA Animal nutritionist: [employer]  . Financial resource strain: Not hard at all  . Food insecurity    Worry: Never true    Inability: Never true  . Transportation needs    Medical: No  Non-medical: No  Tobacco Use  . Smoking status: Never Smoker  . Smokeless tobacco: Never Used  Substance and Sexual Activity  . Alcohol use: Yes    Alcohol/week: 0.0 standard drinks    Comment: 2-3 drinks monthly  . Drug use: No  . Sexual activity: Yes    Partners: Male    Birth control/protection: OCP    Comment: 1 partner   Lifestyle  . Physical activity    Days per week: 4 days    Minutes per session: 50 min  . Stress: Very much  Relationships  . Social Herbalist on phone: Not on file    Gets together: Not on file    Attends religious service: More than 4 times per year     Active member of club or organization: Yes    Attends meetings of clubs or organizations: More than 4 times per year    Relationship status: Never married  Other Topics Concern  . Not on file  Social History Narrative   Ebony Lawson grew up outside of [facility]. 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   Exercise - none at present   Caffeine - rare     Allergies: No Known Allergies  Metabolic Disorder Labs: Lab Results  Component Value Date   HGBA1C 5.2 12/31/2014   No results found for: PROLACTIN Lab Results  Component Value Date   CHOL 139 04/03/2018   TRIG 94 04/03/2018   HDL 50 04/03/2018   LDLCALC 70 04/03/2018   LDLCALC 102 12/31/2014   Lab Results  Component Value Date   TSH 3.100 04/03/2018   TSH 2.220 02/19/2018    Therapeutic Level Labs: No results found for: LITHIUM No results found for: VALPROATE No components found for:  CBMZ  Current Medications: Current Outpatient Medications  Medication Sig Dispense Refill  . Adapalene 0.3 % gel Apply topically at bedtime.    . Calcium Carbonate-Vitamin D (CALCIUM 600+D3 PO) Take 1 tablet by mouth daily.    . drospirenone-ethinyl estradiol (YAZ) 3-0.02 MG tablet Take 1 tablet by mouth daily. 3 Package 3  . hydrOXYzine (ATARAX/VISTARIL) 10 MG tablet Take 1-2 tablets (10-20 mg total) by mouth 2 (two) times daily as needed. For severe anxiety 180 tablet 0  . Melatonin 10 MG TABS Take 1 tablet by mouth at bedtime as needed.    Marland Kitchen spironolactone (ALDACTONE) 100 MG tablet     . traZODone (DESYREL) 50 MG tablet Take 0.5-1 tablets (25-50 mg total) by mouth at bedtime. insomnia 90 tablet 1  . tretinoin (RETIN-A) 0.025 % cream      No current facility-administered medications for this visit.      Musculoskeletal: Strength & Muscle Tone: UTA Gait & Station: normal Patient leans: N/A  Psychiatric Specialty  Exam: Review of Systems  Psychiatric/Behavioral: Negative for substance abuse. The patient is not nervous/anxious.   All other systems reviewed and are negative.   There were no vitals taken for this visit.There is no height or weight on file to calculate BMI.  General Appearance: Casual  Eye Contact:  Fair  Speech:  Clear and Coherent  Volume:  Normal  Mood:  Euthymic  Affect:  Congruent  Thought Process:  Goal Directed and Descriptions of Associations: Intact  Orientation:  Full (Time, Place, and Person)  Thought Content: Logical   Suicidal Thoughts:  No  Homicidal Thoughts:  No  Memory:  Immediate;   Fair Recent;   Fair Remote;   Fair  Judgement:  Fair  Insight:  Good  Psychomotor Activity:  Normal  Concentration:  Concentration: Fair and Attention Span: Fair  Recall:  [employer] of Knowledge: Fair  Language: Fair  Akathisia:  No  Handed:  Right  AIMS (if indicated): Denies tremors, rigidity  Assets:  Communication Skills Desire for Improvement Housing Social Support  ADL's:  Intact  Cognition: WNL  Sleep:  Fair   Screenings: PHQ2-9     Office Visit from 11/05/2018 in [facility] Office Visit from 03/21/2018 in [facility] Office Visit from 09/06/2017 in [facility]  PHQ-2 Total Score  0  0  0       Assessment and Plan: Ebony Lawson is a 33 year old Caucasian female, single, employed, lives in [place], has a history of anxiety, sleep problems was evaluated by telemedicine today.  Patient is biologically predisposed given her family history of mental health problems.  Patient does have psychosocial stressors of new promotion at work.  Patient however is currently doing well.  Plan as noted below.  Plan GAD-stable Continue CBT Hydroxyzine 10 to 20 mg p.o. daily as needed Klonopin 0.25 mg as needed for severe anxiety attacks  Insomnia-stable Trazodone 25 to 50 mg p.o. nightly  Follow-up in clinic in 3 months or  sooner if needed.  December 22 at 4:30 PM  I have spent atleast 15 minutes non face to face with patient today. More than 50 % of the time was spent for psychoeducation and supportive psychotherapy and care coordination. This note was generated in part or whole with voice recognition software. Voice recognition is usually quite accurate but there are transcription errors that can and very often do occur. I apologize for any typographical errors that were not detected and corrected.        Ursula Alert, MD 02/24/2019, 5:07 PM

## 2019-03-04 ENCOUNTER — Ambulatory Visit: Payer: 59 | Admitting: Psychiatry

## 2019-03-12 ENCOUNTER — Ambulatory Visit: Payer: 59 | Admitting: Licensed Clinical Social Worker

## 2019-03-12 ENCOUNTER — Other Ambulatory Visit: Payer: Self-pay

## 2019-04-15 ENCOUNTER — Other Ambulatory Visit: Payer: Self-pay

## 2019-04-15 ENCOUNTER — Ambulatory Visit (INDEPENDENT_AMBULATORY_CARE_PROVIDER_SITE_OTHER): Payer: 59 | Admitting: Licensed Clinical Social Worker

## 2019-04-15 DIAGNOSIS — F411 Generalized anxiety disorder: Secondary | ICD-10-CM | POA: Diagnosis not present

## 2019-04-16 ENCOUNTER — Encounter: Payer: Self-pay | Admitting: Licensed Clinical Social Worker

## 2019-04-16 NOTE — Progress Notes (Signed)
Virtual Visit via Telephone Note  I connected with Ebony Lawson on 04/16/19 at  3:30 PM EST by telephone and verified that I am speaking with the correct person using two identifiers.   I discussed the limitations, risks, security and privacy concerns of performing an evaluation and management service by telephone and the availability of in person appointments. I also discussed with the patient that there may be a patient responsible charge related to this service. The patient expressed understanding and agreed to proceed.     I discussed the assessment and treatment plan with the patient. The patient was provided an opportunity to ask questions and all were answered. The patient agreed with the plan and demonstrated an understanding of the instructions.   The patient was advised to call back or seek an in-person evaluation if the symptoms worsen or if the condition fails to improve as anticipated.  I provided 60 minutes of non-face-to-face time during this encounter.   Alden Hipp, LCSW    THERAPIST PROGRESS NOTE  Session Time: [id number]  Participation Level: Active  Behavioral Response: NeatAlertAnxious  Type of Therapy: Individual Therapy  Treatment Goals addressed: Coping  Interventions: CBT  Summary: Ebony Lawson is a 33 y.o. female who presents with continued symptoms related to her diagnosis. Ebony Lawson reports doing well since our last session. She reports her new position at work has been anxiety producing, as she feels she is unsure what she is doing in some areas, and has had to stay late almost every day. She reports feeling there is no way she can stay on top of the amount of work she is being asked to do, and she has no way to delegate tasks to others. LCSW validated Ebony Lawson's feelings, and encouraged her to stick with it for the next few months until either they hire a new person (as they are down a person now), or she is more sure of what she is supposed to be doing, and can  begin teaching others how to handle responsibilities she delegates to them. We discussed ways to utilize assertive communication and boundary setting to ensure she is not sacrificing her mental health during this process. We moved on to discussing her social life, which Ebony Lawson reports has been non-existent. She reports feelings he has no friends in this area, and is missing having someone to go on hikes with since her break up with her boyfriend. LCSW validated and normalized Ebony Lawson's feelings, and encouraged her to work on her comfort in doing things alone, while also continuing to nurture friendships in her life. We also discussed dating, and how she can go on dates without setting the expectation that the person could be "the one." LCSW suggested Ebony Lawson utilize thought challenging techniques in order to decrease anxiety around these topics.   Suicidal/Homicidal: No  Therapist Response: Ebony Lawson continues to work towards her tx goals but has not yet reached them. We will continue to work on improving CBT skills and emotional regulation skills to manage anxiety symptoms moving forward.   Plan: Return again in 4 weeks.  Diagnosis: Axis I: Generalized Anxiety Disorder    Axis II: No diagnosis    Alden Hipp, LCSW 04/16/2019

## 2019-05-07 ENCOUNTER — Telehealth: Payer: Self-pay

## 2019-05-07 NOTE — Telephone Encounter (Signed)
Confirmed appointment with patient. klh °

## 2019-05-08 ENCOUNTER — Ambulatory Visit: Payer: Managed Care, Other (non HMO) | Admitting: Internal Medicine

## 2019-05-08 ENCOUNTER — Encounter: Payer: Self-pay | Admitting: Internal Medicine

## 2019-05-08 ENCOUNTER — Other Ambulatory Visit: Payer: Self-pay

## 2019-05-08 DIAGNOSIS — Z124 Encounter for screening for malignant neoplasm of cervix: Secondary | ICD-10-CM | POA: Diagnosis not present

## 2019-05-08 DIAGNOSIS — Z0001 Encounter for general adult medical examination with abnormal findings: Secondary | ICD-10-CM

## 2019-05-08 DIAGNOSIS — R300 Dysuria: Secondary | ICD-10-CM

## 2019-05-08 DIAGNOSIS — R5383 Other fatigue: Secondary | ICD-10-CM | POA: Diagnosis not present

## 2019-05-08 NOTE — Progress Notes (Signed)
[facility] [place], [place] [address]  Internal MEDICINE  Office Visit Note  Patient Name: Ebony Lawson  [id number]  [id number]  Date of Service: 05/18/2019  Chief Complaint  Patient presents with  . Annual Exam  . Gynecologic Exam  . Migraine     HPI Pt is here for routine health maintenance examination. She is c/o being tired more than usual. 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 No intraepithelial abnormalities at this time. Sleeps well. Migraine is under good control. Does see Psych, takes trazodone for sleep  Current Medication: Outpatient Encounter Medications as of 05/08/2019  Medication Sig  . Calcium Carbonate-Vitamin D (CALCIUM 600+D3 PO) Take 1 tablet by mouth daily.  . drospirenone-ethinyl estradiol (YAZ) 3-0.02 MG tablet Take 1 tablet by mouth daily.  . hydrOXYzine (ATARAX/VISTARIL) 10 MG tablet Take 1-2 tablets (10-20 mg total) by mouth 2 (two) times daily as needed. For severe anxiety  . Melatonin 10 MG TABS Take 1 tablet by mouth at bedtime as needed.  . traZODone (DESYREL) 50 MG tablet Take 0.5-1 tablets (25-50 mg total) by mouth at bedtime. insomnia  . tretinoin (RETIN-A) 0.025 % cream   . [DISCONTINUED] Adapalene 0.3 % gel Apply topically at bedtime.  . [DISCONTINUED] spironolactone (ALDACTONE) 100 MG tablet    No facility-administered encounter medications on file as of 05/08/2019.    Surgical History: History reviewed. No pertinent surgical history.  Medical History: Past Medical History:  Diagnosis Date  . Heart murmur   . Migraine     Family History: Family History  Problem Relation Age of Onset  . Hyperlipidemia Mother   . Cancer Father        prostate and skin cancer  . Hyperlipidemia Father   . Heart disease Father        CAD - triple bypass 2014  . Hypertension Father   . Hearing loss Paternal Grandfather   . Hypertension Paternal Grandfather   . Anxiety disorder  Brother     Review of Systems  Constitutional: Positive for fatigue. Negative for chills and diaphoresis.  HENT: Negative for ear pain, postnasal drip and sinus pressure.   Eyes: Negative for photophobia, discharge, redness, itching and visual disturbance.  Respiratory: Negative for cough, shortness of breath and wheezing.   Cardiovascular: Negative for chest pain, palpitations and leg swelling.  Gastrointestinal: Negative for abdominal pain, constipation, diarrhea, nausea and vomiting.  Genitourinary: Negative for dysuria and flank pain.  Musculoskeletal: Negative for arthralgias, back pain, gait problem and neck pain.  Skin: Negative for color change.  Allergic/Immunologic: Negative for environmental allergies and food allergies.  Neurological: Negative for dizziness and headaches.  Hematological: Does not bruise/bleed easily.  Psychiatric/Behavioral: Negative for agitation, behavioral problems (depression) and hallucinations.     Vital Signs: BP (!) 109/57   Pulse 77   Temp 97.6 F (36.4 C)   Resp 16   Ht 5' (1.524 m)   Wt 103 lb (46.7 kg)   SpO2 99%   BMI 20.12 kg/m    Physical Exam Constitutional:      General: She is not in acute distress.    Appearance: She is well-developed. She is not diaphoretic.  HENT:     Head: Normocephalic and atraumatic.     Mouth/Throat:     Pharynx: No oropharyngeal exudate.  Eyes:     Pupils: Pupils are equal, round, and reactive to light.  Neck:     Thyroid:  No thyromegaly.     Vascular: No JVD.     Trachea: No tracheal deviation.  Cardiovascular:     Rate and Rhythm: Normal rate and regular rhythm.     Heart sounds: Normal heart sounds. No murmur. No friction rub. No gallop.   Pulmonary:     Effort: Pulmonary effort is normal. No respiratory distress.     Breath sounds: No wheezing or rales.  Chest:     Chest wall: Mass present. No tenderness.     Breasts:        Right: No mass.        Left: Normal. No mass.  Abdominal:      General: Bowel sounds are normal.     Palpations: Abdomen is soft.  Musculoskeletal:        General: Normal range of motion.     Cervical back: Normal range of motion and neck supple.  Lymphadenopathy:     Cervical: No cervical adenopathy.  Skin:    General: Skin is warm and dry.  Neurological:     Mental Status: She is alert and oriented to person, place, and time.     Cranial Nerves: No cranial nerve deficit.  Psychiatric:        Behavior: Behavior normal.        Thought Content: Thought content normal.        Judgment: Judgment normal.      LABS: Recent Results (from the past 2160 hour(s))  UA/M w/rflx Culture, Routine     Status: Abnormal   Collection Time: 05/08/19 12:00 AM   Specimen: Urine   URINE  Result Value Ref Range   Specific Gravity, UA 1.022 1.005 - 1.030   pH, UA 5.0 5.0 - 7.5   Color, UA Yellow Yellow   Appearance Ur Cloudy (A) Clear   Leukocytes,UA 1+ (A) Negative   Protein,UA Negative Negative/Trace   Glucose, UA Negative Negative   Ketones, UA Negative Negative   RBC, UA Negative Negative   Bilirubin, UA Negative Negative   Urobilinogen, Ur 0.2 0.2 - 1.0 mg/dL   Nitrite, UA Negative Negative   Microscopic Examination See below:     Comment: Microscopic was indicated and was performed.   Urinalysis Reflex Comment     Comment: This specimen has reflexed to a Urine Culture.  Pap IG and HPV (high risk) DNA detection     Status: Abnormal   Collection Time: 05/08/19 12:00 AM  Result Value Ref Range   Interpretation NILM     Comment: NEGATIVE FOR INTRAEPITHELIAL LESION OR MALIGNANCY.   Category NIL     Comment: Negative for Intraepithelial Lesion   Adequacy ENDO     Comment: Satisfactory for evaluation. Endocervical and/or squamous metaplastic cells (endocervical component) are present.    Clinician Provided ICD10 Comment     Comment: Z12.4   Performed by: Comment     Comment: Vernell Barrier, Cytotechnologist (ASCP)   Note: Comment      Comment: The Pap smear is a screening test designed to aid in the detection of premalignant and malignant conditions of the uterine cervix.  It is not a diagnostic procedure and should not be used as the sole means of detecting cervical cancer.  Both false-positive and false-negative reports do occur.    Test Methodology Comment     Comment: This liquid based ThinPrep(R) pap test was screened with the use of an image guided system.    HPV, high-risk Positive (A) Negative    Comment:  This nucleic acid amplification high-risk HPV test detects thirteen high-risk types (16,18,31,33,35,39,45,51,52,56,58,59,68) without differentiation. **EFFECTIVE June 09, 2019 THIS TEST WILL BE MADE NON-ORDERABLE. SEE [facility] DIRECTORY OF SERVICES FOR ALTERNATIVES TO THE HIGH RISK HPV TEST. ANY PROFILE THAT INCLUDES THIS TEST WILL ALSO BE MADE NON-ORDERABLE.**   Microscopic Examination     Status: Abnormal   Collection Time: 05/08/19 12:00 AM   URINE  Result Value Ref Range   WBC, UA 6-10 (A) 0 - 5 /hpf   RBC 0-2 0 - 2 /hpf   Epithelial Cells (non renal) 0-10 0 - 10 /hpf   Casts None seen None seen /lpf   Mucus, UA Present Not Estab.   Bacteria, UA None seen None seen/Few  Urine Culture, Reflex     Status: None   Collection Time: 05/08/19 12:00 AM   URINE  Result Value Ref Range   Urine Culture, Routine Final report    Organism ID, Bacteria No growth   B12 and Folate Panel     Status: None   Collection Time: 05/12/19  8:59 AM  Result Value Ref Range   Vitamin B-12 488 232 - 1,245 pg/mL   Folate 10.9 >3.0 ng/mL    Comment: A serum folate concentration of less than 3.1 ng/mL is considered to represent clinical deficiency.   TSH + free T4     Status: None   Collection Time: 05/12/19  8:59 AM  Result Value Ref Range   TSH 1.790 0.450 - 4.500 uIU/mL   Free T4 1.16 0.82 - 1.77 ng/dL  UA/M w/rflx Culture, Routine     Status: Abnormal   Collection Time: 05/12/19  8:59 AM  Result Value Ref Range    Specific Gravity, UA 1.026 1.005 - 1.030   pH, UA 5.0 5.0 - 7.5   Color, UA Orange Yellow   Appearance Ur Cloudy (A) Clear   Leukocytes,UA 1+ (A) Negative   Protein,UA 2+ (A) Negative/Trace   Glucose, UA Negative Negative   Ketones, UA Negative Negative   RBC, UA 3+ (A) Negative   Bilirubin, UA Negative Negative   Urobilinogen, Ur 1.0 0.2 - 1.0 mg/dL   Nitrite, UA Negative Negative   Microscopic Examination See below:     Comment: Microscopic was indicated and was performed.   Urinalysis Reflex Comment     Comment: This specimen has reflexed to a Urine Culture.  Microscopic Examination     Status: Abnormal   Collection Time: 05/12/19  8:59 AM  Result Value Ref Range   WBC, UA >30 (A) 0 - 5 /hpf   RBC >30 (A) 0 - 2 /hpf   Epithelial Cells (non renal) >10 (A) 0 - 10 /hpf   Casts None seen None seen /lpf   Mucus, UA Present Not Estab.   Bacteria, UA Few None seen/Few  Urine Culture, Reflex     Status: None   Collection Time: 05/12/19  8:59 AM  Result Value Ref Range   Urine Culture, Routine Final report    Organism ID, Bacteria Comment     Comment: Culture shows less than 10,000 colony forming units of bacteria per milliliter of urine. This colony count is not generally considered to be clinically significant.    Assessment/Plan: 1. Encounter for general adult medical examination with abnormal findings - Update labs, [id number] and folic acid   2. Routine cervical smear - Pap IG and HPV (high risk) DNA detection// repeat   3. Fatigue, unspecified type - Check labs  - B12 and  Folate Panel - TSH + free T4 - Ferritin; Future  4. Dysuria - UA/M w/rflx Culture, Routine  General Counseling: Tmya verbalizes understanding of the findings of todays visit and agrees with plan of treatment. I have discussed any further diagnostic evaluation that may be needed or ordered today. We also reviewed her medications today. she has been encouraged to call the office with any questions or  concerns that should arise related to todays visit.  Orders Placed This Encounter  Procedures  . Microscopic Examination  . Urine Culture, Reflex  . Microscopic Examination  . Urine Culture, Reflex  . UA/M w/rflx Culture, Routine  . B12 and Folate Panel  . TSH + free T4  . Ferritin  . UA/M w/rflx Culture, Routine    Time spent:25Minutes  Lavera Guise, MD  Internal Medicine

## 2019-05-10 LAB — UA/M W/RFLX CULTURE, ROUTINE
Bilirubin, UA: NEGATIVE
Glucose, UA: NEGATIVE
Ketones, UA: NEGATIVE
Nitrite, UA: NEGATIVE
Protein,UA: NEGATIVE
RBC, UA: NEGATIVE
Specific Gravity, UA: 1.022 (ref 1.005–1.030)
Urobilinogen, Ur: 0.2 mg/dL (ref 0.2–1.0)
pH, UA: 5 (ref 5.0–7.5)

## 2019-05-10 LAB — URINE CULTURE, REFLEX: Organism ID, Bacteria: NO GROWTH

## 2019-05-10 LAB — MICROSCOPIC EXAMINATION
Bacteria, UA: NONE SEEN
Casts: NONE SEEN /lpf

## 2019-05-14 LAB — TSH+FREE T4
Free T4: 1.16 ng/dL (ref 0.82–1.77)
TSH: 1.79 u[IU]/mL (ref 0.450–4.500)

## 2019-05-14 LAB — UA/M W/RFLX CULTURE, ROUTINE
Bilirubin, UA: NEGATIVE
Glucose, UA: NEGATIVE
Ketones, UA: NEGATIVE
Nitrite, UA: NEGATIVE
Specific Gravity, UA: 1.026 (ref 1.005–1.030)
Urobilinogen, Ur: 1 mg/dL (ref 0.2–1.0)
pH, UA: 5 (ref 5.0–7.5)

## 2019-05-14 LAB — MICROSCOPIC EXAMINATION
Casts: NONE SEEN /lpf
Epithelial Cells (non renal): >10 /hpf — AB (ref 0–10)
RBC, Urine: >30 /hpf — AB (ref 0–2)
WBC, UA: >30 /hpf — AB (ref 0–5)

## 2019-05-14 LAB — URINE CULTURE, REFLEX

## 2019-05-14 LAB — B12 AND FOLATE PANEL
Folate: 10.9 ng/mL (ref >3.0)
Vitamin B-12: 488 pg/mL (ref 232–1245)

## 2019-05-15 LAB — PAP IG AND HPV HIGH-RISK: HPV, high-risk: POSITIVE — AB

## 2019-05-19 ENCOUNTER — Other Ambulatory Visit: Payer: Self-pay

## 2019-05-19 ENCOUNTER — Ambulatory Visit (INDEPENDENT_AMBULATORY_CARE_PROVIDER_SITE_OTHER): Payer: 59 | Admitting: Licensed Clinical Social Worker

## 2019-05-19 DIAGNOSIS — F411 Generalized anxiety disorder: Secondary | ICD-10-CM | POA: Diagnosis not present

## 2019-05-20 ENCOUNTER — Encounter: Payer: Self-pay | Admitting: Licensed Clinical Social Worker

## 2019-05-20 NOTE — Progress Notes (Signed)
Virtual Visit via Telephone Note  I connected with Mizani Brundage on 05/20/19 at  3:30 PM EST by telephone and verified that I am speaking with the correct person using two identifiers.   I discussed the limitations, risks, security and privacy concerns of performing an evaluation and management service by telephone and the availability of in person appointments. I also discussed with the patient that there may be a patient responsible charge related to this service. The patient expressed understanding and agreed to proceed.  I discussed the assessment and treatment plan with the patient. The patient was provided an opportunity to ask questions and all were answered. The patient agreed with the plan and demonstrated an understanding of the instructions.   The patient was advised to call back or seek an in-person evaluation if the symptoms worsen or if the condition fails to improve as anticipated.  I provided 45 minutes of non-face-to-face time during this encounter.   Alden Hipp, LCSW    THERAPIST PROGRESS NOTE  Session Time: 1287  Participation Level: Active  Behavioral Response: NeatAlertAnxious  Type of Therapy: Individual Therapy  Treatment Goals addressed: Anxiety  Interventions: Supportive  Summary: Jozy Mcphearson is a 33 y.o. female who presents with continued symptoms related to her diagnosis. Elfa reports doing well since our last session. Lillyth reports things have been going well, "but today I'm having one of those days where everything is irritating me." LCSW held space for Grete to vent about her work frustrations. She reported she'd taken the day off, but ended up going in--and had planned to be there for a couple hours but ended up staying most of the day. She reported feeling frustrated with her co-workers and feeling that she has to do everyone's job and her own. LCSW validated Mayari's experiences, and encouraged her to continue bringing these things to her  supervisor's attention. Silvia expressed understanding and agreement. Hailyn went on to discuss that she'd met a man online, and had gone on one real and one virtual date with him. She stated they went on a short four mile hike, which she really enjoyed. She reports feeling excited about the prospect of a new relationship. LCSW encouraged Elayne to recognize the progress she'd made since the previous session where she was worried she would be alone. Jenavie was able to see this progress as well.   Suicidal/Homicidal: No  Therapist Response: Saphire continues to work towards her tx goals but has not yet reached them. We will continue to work on improving emotional regulation skills and distress tolerance skills moving forward.   Plan: Return again in 4 weeks.  Diagnosis: Axis I: Generalized Anxiety Disorder    Axis II: No diagnosis    Alden Hipp, LCSW 05/20/2019

## 2019-05-27 ENCOUNTER — Ambulatory Visit (INDEPENDENT_AMBULATORY_CARE_PROVIDER_SITE_OTHER): Payer: 59 | Admitting: Psychiatry

## 2019-05-27 ENCOUNTER — Encounter: Payer: Self-pay | Admitting: Psychiatry

## 2019-05-27 ENCOUNTER — Other Ambulatory Visit: Payer: Self-pay

## 2019-05-27 DIAGNOSIS — F411 Generalized anxiety disorder: Secondary | ICD-10-CM

## 2019-05-27 DIAGNOSIS — F5105 Insomnia due to other mental disorder: Secondary | ICD-10-CM | POA: Diagnosis not present

## 2019-05-27 NOTE — Progress Notes (Signed)
Virtual Visit via Video Note  I connected with Ebony Lawson on 05/27/19 at  4:30 PM EST by a video enabled telemedicine application and verified that I am speaking with the correct person using two identifiers.   I discussed the limitations of evaluation and management by telemedicine and the availability of in person appointments. The patient expressed understanding and agreed to proceed.     I discussed the assessment and treatment plan with the patient. The patient was provided an opportunity to ask questions and all were answered. The patient agreed with the plan and demonstrated an understanding of the instructions.   The patient was advised to call back or seek an in-person evaluation if the symptoms worsen or if the condition fails to improve as anticipated.   [place] MD OP Progress Note  05/27/2019 5:35 PM Ebony Lawson  MRN:  [id number]  Chief Complaint:  Chief Complaint    Follow-up     HPI: Ebony Lawson is a 33 year old Caucasian female, single, employed, lives in [place], has a history of GAD, insomnia was evaluated by telemedicine today.  Patient today reports she is currently doing well on the current medication regimen.  She denies any significant anxiety symptoms.  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  She reports work continues to be busy.  She however has been coping okay.  She reports sleep is good.  She takes trazodone only as needed.  She continues to work with her therapist which is going well.  She denies any suicidality, homicidality or perceptual disturbances. Visit Diagnosis:    ICD-10-CM   1. 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  Past Medical History:  Past Medical History:  Diagnosis Date  . Heart murmur   . Migraine    History reviewed. No pertinent  surgical history.  Family Psychiatric History: I have reviewed family psychiatric history from my progress note on 02/15/2018.  Family History:  Family History  Problem Relation Age of Onset  . Hyperlipidemia Mother   . Cancer Father        prostate and skin cancer  . Hyperlipidemia Father   . Heart disease Father        CAD - triple bypass 2014  . Hypertension Father   . Hearing loss Paternal Grandfather   . Hypertension Paternal Grandfather   . Anxiety disorder Brother     Social History: I have reviewed social history from my progress note on 02/15/2018. Social History   Socioeconomic History  . Marital status: Single    Spouse name: Not on file  . Number of children: 0  . Years of education: 16  . Highest education level: Bachelor's degree (e.g., BA, AB, BS)  Occupational History  . Occupation: DNA Animal nutritionist: [employer]  Tobacco Use  . Smoking status: Never Smoker  . Smokeless tobacco: Never Used  Substance and Sexual Activity  . Alcohol use: Yes    Alcohol/week: 0.0 standard drinks    Comment: 2-3 drinks monthly  . Drug use: No  . Sexual activity: Yes    Partners: Male    Birth control/protection: OCP    Comment: 1 partner   Other Topics Concern  . Not on file  Social History Narrative   Tayelor grew up outside of [place]. She attended [employer] and obtained a Manufacturing engineer in Arts administrator in Education officer, museum. 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 Difficulty of Paying Living Expenses: Not on file  Food Insecurity:   . Worried About Charity fundraiser in the Last Year: Not on file  . Ran Out of Food in the Last Year: Not on file  Transportation Needs:   . Lack of Transportation (Medical): Not on file  . Lack of Transportation (Non-Medical): Not on file  Physical Activity:   . Days of Exercise per  Week: Not on file  . Minutes of Exercise per Session: Not on file  Stress:   . Feeling of Stress : Not on file  Social Connections:   . Frequency of Communication with Friends and Family: Not on file  . Frequency of Social Gatherings with Friends and Family: Not on file  . Attends Religious Services: Not on file  . Active Member of Clubs or Organizations: Not on file  . Attends Archivist Meetings: Not on file  . Marital Status: Not on file    Allergies: No Known Allergies  Metabolic Disorder Labs: Lab Results  Component Value Date   HGBA1C 5.2 12/31/2014   No results found for: PROLACTIN Lab Results  Component Value Date   CHOL 139 04/03/2018   TRIG 94 04/03/2018   HDL 50 04/03/2018   LDLCALC 70 04/03/2018   [place] 102 12/31/2014   Lab Results  Component Value Date   TSH 1.790 05/12/2019   TSH 3.100 04/03/2018    Therapeutic Level Labs: No results found for: LITHIUM No results found for: VALPROATE No components found for:  CBMZ  Current Medications: Current Outpatient Medications  Medication Sig Dispense Refill  . Calcium Carbonate-Vitamin D (CALCIUM 600+D3 PO) Take 1 tablet by mouth daily.    . drospirenone-ethinyl estradiol (YAZ) 3-0.02 MG tablet Take 1 tablet by mouth daily. 3 Package 3  . hydrOXYzine (ATARAX/VISTARIL) 10 MG tablet Take 1-2 tablets (10-20 mg total) by mouth 2 (two) times daily as needed. For severe anxiety 180 tablet 0  . Melatonin 10 MG TABS Take 1 tablet by mouth at bedtime as needed.    . traZODone (DESYREL) 50 MG tablet Take 0.5-1 tablets (25-50 mg total) by mouth at bedtime. insomnia 90 tablet 1  . tretinoin (RETIN-A) 0.025 % cream      No current facility-administered medications for this visit.     Musculoskeletal: Strength & Muscle Tone: UTA Gait & Station: normal Patient leans: N/A  Psychiatric Specialty Exam: Review of Systems  Psychiatric/Behavioral: Negative for agitation, behavioral problems, confusion,  decreased concentration, dysphoric mood, hallucinations, self-injury, sleep disturbance and suicidal ideas. The patient is not nervous/anxious and is not hyperactive.   All other systems reviewed and are negative.   There were no vitals taken for this visit.There is no height or weight on file to calculate BMI.  General Appearance: Casual  Eye Contact:  Good  Speech:  Normal Rate  Volume:  Normal  Mood:  Euthymic  Affect:  Congruent  Thought Process:  Goal Directed and Descriptions of Associations: Intact  Orientation:  Full (Time, Place, and Person)  Thought Content: Logical   Suicidal Thoughts:  No  Homicidal Thoughts:  No  Memory:  Immediate;   Fair  Recent;   Fair Remote;   Fair  Judgement:  Fair  Insight:  Fair  Psychomotor Activity:  Normal  Concentration:  Concentration: Fair and Attention Span: Fair  Recall:  [employer] of Knowledge: Fair  Language: Fair  Akathisia:  No  Handed:  Right  AIMS (if indicated): Denies tremors, rigidity  Assets:  Communication Skills Desire for Improvement Housing Social Support  ADL's:  Intact  Cognition: WNL  Sleep:  Fair   Screenings: PHQ2-9     Office Visit from 05/08/2019 in [facility], [facility] Office Visit from [facility]/05/[facility] Office Visit from 03/21/2018 in [facility], [facility] Office Visit from 09/06/2017 in [facility], [facility]  PHQ-2 Total Score  0  0  0  0       Assessment and Plan: Ebony Lawson is a 33 year old Caucasian female, single, employed, lives in [place], has a history of anxiety, sleep problems was evaluated by telemedicine today.  Patient is biologically predisposed given her family history of mental health problems.  Patient is currently doing well on current medication regimen.  Plan as noted below.  Plan GAD-stable Continue CBT Hydroxyzine 10 to 20 mg p.o. daily as needed Klonopin 0.25 mg as needed for severe anxiety attacks.  She rarely takes  it.  Insomnia-stable Trazodone 25 to 50 mg p.o. nightly.  She has been limiting use.  Follow-up in clinic in 5 to 6 months or sooner if needed.  May 20 at 4:40 PM  I have spent atleast 15 minutes non face to face with patient today. More than 50 % of the time was spent for psychoeducation and supportive psychotherapy and care coordination. This note was generated in part or whole with voice recognition software. Voice recognition is usually quite accurate but there are transcription errors that can and very often do occur. I apologize for any typographical errors that were not detected and corrected.       Ursula Alert, MD 05/27/2019, 5:35 PM

## 2019-06-16 ENCOUNTER — Ambulatory Visit (INDEPENDENT_AMBULATORY_CARE_PROVIDER_SITE_OTHER): Payer: 59 | Admitting: Licensed Clinical Social Worker

## 2019-06-16 ENCOUNTER — Other Ambulatory Visit: Payer: Self-pay

## 2019-06-16 DIAGNOSIS — F411 Generalized anxiety disorder: Secondary | ICD-10-CM | POA: Diagnosis not present

## 2019-06-17 ENCOUNTER — Encounter: Payer: Self-pay | Admitting: Licensed Clinical Social Worker

## 2019-06-17 NOTE — Progress Notes (Signed)
Virtual Visit via Video Note  I connected with Lycia Caudell on 06/17/19 at  3:30 PM EST by a video enabled telemedicine application and verified that I am speaking with the correct person using two identifiers.   I discussed the limitations of evaluation and management by telemedicine and the availability of in person appointments. The patient expressed understanding and agreed to proceed.    I discussed the assessment and treatment plan with the patient. The patient was provided an opportunity to ask questions and all were answered. The patient agreed with the plan and demonstrated an understanding of the instructions.   The patient was advised to call back or seek an in-person evaluation if the symptoms worsen or if the condition fails to improve as anticipated.  I provided 60 minutes of non-face-to-face time during this encounter.   Alden Hipp, LCSW    THERAPIST PROGRESS NOTE  Session Time: [id number]  Participation Level: Active  Behavioral Response: NeatAlertAnxious  Type of Therapy: Individual Therapy  Treatment Goals addressed: Coping  Interventions: Supportive  Summary: Rosealee Huntress is a 34 y.o. female who presents with continued symptoms related to her diagnosis. Marthe reports doing well since our last session. She reports moments of anxiety throughout the month but stated she has been able to manage it. She reports stress associated with her new job role is still present, but she is starting to feel more settled within her new position. LCSW validated this information and highlighted how well Kohana has been able to manage that anxiety. Dorthie reports they have hired new people to offer some relief, so she no longer feels obligated to stay at work past/before when she is supposed to be there. Jamaya went on to discussing anxiety around her cat's health. She reports her cat was not eating well, and had other associated problems. Kellina reported this was stressful, but after  taking her to the vet, Lucyle was able to manage her anxiety around the situation. LCSW validated this idea and encouraged Laruth to recognize how much progress she has made in managing her anxiety, especially where her cat is concerned. We reviewed thought challenging techniques Yamilex could utilize in order to manage anxious thoughts int he moment. Haleigh expressed understanding and agreement.   Suicidal/Homicidal: No   Therapist Response: Leiah continues to work towards her tx goals but has not yet reached them. We will continue to work on improving emotional regulation skills moving forward.   Plan: Return again in 4 weeks.  Diagnosis: Axis I: Generalized Anxiety Disorder    Axis II: No diagnosis    Alden Hipp, LCSW 06/17/2019

## 2019-07-09 ENCOUNTER — Other Ambulatory Visit: Payer: Self-pay

## 2019-07-09 ENCOUNTER — Ambulatory Visit (INDEPENDENT_AMBULATORY_CARE_PROVIDER_SITE_OTHER): Payer: 59 | Admitting: Licensed Clinical Social Worker

## 2019-07-09 DIAGNOSIS — F411 Generalized anxiety disorder: Secondary | ICD-10-CM | POA: Diagnosis not present

## 2019-07-10 ENCOUNTER — Encounter: Payer: Self-pay | Admitting: Licensed Clinical Social Worker

## 2019-07-10 NOTE — Progress Notes (Signed)
Virtual Visit via Video Note  I connected with Ebony Lawson on 07/10/19 at  3:30 PM EST by a video enabled telemedicine application and verified that I am speaking with the correct person using two identifiers.   I discussed the limitations of evaluation and management by telemedicine and the availability of in person appointments. The patient expressed understanding and agreed to proceed.   I discussed the assessment and treatment plan with the patient. The patient was provided an opportunity to ask questions and all were answered. The patient agreed with the plan and demonstrated an understanding of the instructions.   The patient was advised to call back or seek an in-person evaluation if the symptoms worsen or if the condition fails to improve as anticipated.  I provided 60 minutes of non-face-to-face time during this encounter.   Alden Hipp, LCSW    THERAPIST PROGRESS NOTE  Session Time: 1530  Participation Level: None  Behavioral Response: NeatAlertAnxious  Type of Therapy: Individual Therapy  Treatment Goals addressed: Anxiety  Interventions: Supportive  Summary: Ebony Lawson is a 34 y.o. female who presents with continued symptoms releated to her diagnosis.  Ebony Lawson reports doing well since our last session. She notes increased moments of anxiety due to her cat's health problems. She reports they thought they figured out what was going on with her cat, but she continued having health issues and subsequently is having an ultra sound done next week. Ebony Lawson reports having anxiety around the possible outcomes of the ultrasound. LCSW validated and normalized Ebony Lawson's feelings, and held space for her to discuss her worries and feelings around the situation. LCSW encouraged Ebony Lawson to begin challenging her anxious thoughts as they come up as they relate to her cat, and attempt to tell herself not to worry until she has more information. Ebony Lawson expressed understanding and agreement with  the information presented. Ebony Lawson went on to discuss work, and how she feels she is asking for help in the form of a new position to get them more hands on--but those requests have been denied. LCSW validated Ebony Lawson's frustration and encouraged her to continue advocating for herself appropriately, while also setting appropriate boundaries with her work/home life.  Suicidal/Homicidal: No   Therapist Response: Ebony Lawson continues to work towards her tx goals but has not yet reached them. We will continue to work on improving emotional regulation skills and distress tolerance moving forward.   Plan: Return again in 4 weeks.  Diagnosis: Axis I: Generalized Anxiety Disorder    Axis II: No diagnosis    Alden Hipp, LCSW 07/10/2019

## 2019-08-07 ENCOUNTER — Ambulatory Visit (INDEPENDENT_AMBULATORY_CARE_PROVIDER_SITE_OTHER): Payer: 59 | Admitting: Licensed Clinical Social Worker

## 2019-08-07 ENCOUNTER — Other Ambulatory Visit: Payer: Self-pay

## 2019-08-07 DIAGNOSIS — F411 Generalized anxiety disorder: Secondary | ICD-10-CM

## 2019-08-08 ENCOUNTER — Encounter: Payer: Self-pay | Admitting: Licensed Clinical Social Worker

## 2019-08-08 NOTE — Progress Notes (Signed)
  Virtual Visit via Telephone Note  I connected with Ebony Lawson on 08/08/19 at  3:30 PM EST by telephone and verified that I am speaking with the correct person using two identifiers.   I discussed the limitations, risks, security and privacy concerns of performing an evaluation and management service by telephone and the availability of in person appointments. I also discussed with the patient that there may be a patient responsible charge related to this service. The patient expressed understanding and agreed to proceed.   I discussed the assessment and treatment plan with the patient. The patient was provided an opportunity to ask questions and all were answered. The patient agreed with the plan and demonstrated an understanding of the instructions.   The patient was advised to call back or seek an in-person evaluation if the symptoms worsen or if the condition fails to improve as anticipated.  I provided 53 minutes of non-face-to-face time during this encounter.   Alden Hipp, LCSW   THERAPIST PROGRESS NOTE  Session Time: [id number]  Participation Level: Active  Behavioral Response: CasualAlertAnxious  Type of Therapy: Individual Therapy  Treatment Goals addressed: Coping  Interventions: Supportive  Summary: Ebony Lawson is a 34 y.o. female who presents with continued symptoms related to her diagnosis. Ebony Lawson reports doing well since our last session. She reports her primary stress is now coming from her cat, and her cat's desire to go outside. "I started taking her out on a harness every  Now and then, and now she demands to go out." LCSW validated these feelings and encouraged Ebony Lawson to recognize we often personify animals and often they do not express/understand feelings the way that we do. We discussed ways to challenge anxious thoughts and reviewed CBT skills that could assist in this process. Ebony Lawson expressed understanding and agreement with this information. Ebony Lawson went on to  discussing her job, and how she is feeling stressed regarding the amount of help she has. LCSW encouraged Ebony Lawson to continue advocating for herself to her supervisor in order to make change. We also discussed improving her work/life balance. Ebony Lawson expressed understanding and agreement.   Suicidal/Homicidal: No  Therapist Response: Ebony Lawson continues to work towards her tx goals but has not yet reached them. We will continue to work on improving emotional regulation and CBT skills moving forward.   Plan: Return again in 4 weeks.  Diagnosis: Axis I: Generalized Anxiety Disorder    Axis II: No diagnosis    Alden Hipp, LCSW 08/08/2019

## 2019-09-10 ENCOUNTER — Other Ambulatory Visit: Payer: Self-pay

## 2019-09-10 MED ORDER — SPIRONOLACTONE 100 MG PO TABS: 100.0000 mg | ORAL_TABLET | Freq: Every day | ORAL | 2 refills | Status: DC

## 2019-09-10 MED ORDER — SULFACETAMIDE SODIUM-SULFUR 8-4 % EX SUSP: 1.0000 "application " | Freq: Every day | CUTANEOUS | 2 refills | Status: DC

## 2019-09-15 ENCOUNTER — Ambulatory Visit: Payer: 59 | Admitting: Licensed Clinical Social Worker

## 2019-09-18 ENCOUNTER — Ambulatory Visit: Payer: Self-pay | Admitting: Dermatology

## 2019-10-16 ENCOUNTER — Other Ambulatory Visit: Payer: Self-pay

## 2019-10-16 DIAGNOSIS — Z30011 Encounter for initial prescription of contraceptive pills: Secondary | ICD-10-CM

## 2019-10-16 MED ORDER — DROSPIRENONE-ETHINYL ESTRADIOL 3-0.02 MG PO TABS: 1.0000 | ORAL_TABLET | Freq: Every day | ORAL | 3 refills | Status: DC

## 2019-10-23 ENCOUNTER — Telehealth (INDEPENDENT_AMBULATORY_CARE_PROVIDER_SITE_OTHER): Payer: 59 | Admitting: Psychiatry

## 2019-10-23 ENCOUNTER — Encounter: Payer: Self-pay | Admitting: Psychiatry

## 2019-10-23 ENCOUNTER — Other Ambulatory Visit: Payer: Self-pay

## 2019-10-23 DIAGNOSIS — F411 Generalized anxiety disorder: Secondary | ICD-10-CM

## 2019-10-23 DIAGNOSIS — F5105 Insomnia due to other mental disorder: Secondary | ICD-10-CM | POA: Diagnosis not present

## 2019-10-23 NOTE — Progress Notes (Signed)
Provider Location : [facility] Patient Location : Home  Virtual Visit via Video Note  I connected with Ebony Lawson on 10/23/19 at  4:40 PM EDT by a video enabled telemedicine application and verified that I am speaking with the correct person using two identifiers.   I discussed the limitations of evaluation and management by telemedicine and the availability of in person appointments. The patient expressed understanding and agreed to proceed.     I discussed the assessment and treatment plan with the patient. The patient was provided an opportunity to ask questions and all were answered. The patient agreed with the plan and demonstrated an understanding of the instructions.   The patient was advised to call back or seek an in-person evaluation if the symptoms worsen or if the condition fails to improve as anticipated.   Foley MD OP Progress Note  10/23/2019 5:08 PM Deede Heskett  MRN:  [id number]  Chief Complaint:  Chief Complaint    Follow-up     HPI: Ebony Lawson is a 34 year old Caucasian female, single, employed, lives in [place], has a history of GAD, insomnia was evaluated by telemedicine today.  Patient today reports she is currently coping with her anxiety symptoms okay.  She does have mild anxiety on and off mostly regarding her work-related stressors.  She reports there are times when she brings back her work-related problems when she gets back home at the end of the day and continues to ruminate.  She reports she has not been able to see her therapist after her last appointment in March 2021.  Her therapist is no longer at the practice and she is currently trying to establish care with a new therapist.  She continues to have hydroxyzine available which she takes sporadically as needed.  That does help.  Patient reports sleep is good.  She is not taking trazodone at this time.  She denies any sadness or crying spells.  She denies any suicidality, homicidality or perceptual  disturbances.  Patient denies any other concerns today.    Visit Diagnosis:    ICD-10-CM   1. 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 Heart murmur   . Migraine    History reviewed. No pertinent surgical history.  Family Psychiatric History: I have reviewed family psychiatric history from my progress note on 02/15/2018  Family History:  Family History  Problem Relation Age of Onset  . Hyperlipidemia Mother   . Cancer Father        prostate and skin cancer  . Hyperlipidemia Father   . Heart disease Father        CAD - triple bypass 2014  . Hypertension Father   . Hearing loss Paternal Grandfather   . Hypertension Paternal Grandfather   . Anxiety disorder Brother     Social History: I have reviewed social history from my progress note on 02/15/2018 Social History   Socioeconomic History  . Marital status: Single    Spouse name: Not on file  . Number of children: 0  . Years of education: 16  . Highest education level: Bachelor's degree (e.g., BA, AB, BS)  Occupational History  . Occupation: DNA Animal nutritionist: [employer]  Tobacco Use  . Smoking status: Never Smoker  .  Smokeless tobacco: Never Used  Substance and Sexual Activity  . Alcohol use: Yes    Alcohol/week: 0.0 standard drinks    Comment: 2-3 drinks monthly  . Drug use: No  . Sexual activity: Yes    Partners: Male    Birth control/protection: OCP    Comment: 1 partner   Other Topics Concern  . Not on file  Social History Narrative   Lourie grew up outside of [facility]. She attended [employer] and obtained a Manufacturing engineer in [employer] in [place]. 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 Difficulty of Paying Living Expenses:   Food Insecurity:   . Worried About Charity fundraiser in the Last Year:   . Arboriculturist in the Last Year:   Transportation Needs:   . Film/video editor (Medical):   Marland Kitchen Lack of Transportation (Non-Medical):   Physical Activity:   . Days of Exercise per Week:   . Minutes of Exercise per Session:   Stress:   . Feeling of Stress :   Social Connections:   . Frequency of Communication with Friends and Family:   . Frequency of Social Gatherings with Friends and Family:   . Attends Religious Services:   . Active Member of Clubs or Organizations:   . Attends Archivist Meetings:   Marland Kitchen Marital Status:     Allergies: No Known Allergies  Metabolic Disorder Labs: Lab Results  Component Value Date   HGBA1C 5.2 12/31/2014   No results found for: PROLACTIN Lab Results  Component Value Date   CHOL 139 04/03/2018   TRIG 94 04/03/2018   HDL 50 04/03/2018   LDLCALC 70 04/03/2018   [place] 102 12/31/2014   Lab Results  Component Value Date   TSH 1.790 05/12/2019   TSH 3.100 04/03/2018    Therapeutic Level Labs: No results found for: LITHIUM No results found for: VALPROATE No components found for:  CBMZ  Current Medications: Current Outpatient Medications  Medication Sig Dispense Refill  . Adapalene 0.3 % gel APPLY TO THE FACE EVERY MORNING    . Calcium Carbonate-Vitamin D (CALCIUM 600+D3 PO) Take 1 tablet by mouth daily.    . drospirenone-ethinyl estradiol (YAZ) 3-0.02 MG tablet Take 1 tablet by mouth daily. 3 Package 3  . hydrOXYzine (ATARAX/VISTARIL) 10 MG tablet Take 1-2 tablets (10-20 mg total) by mouth 2 (two) times daily as needed. For severe anxiety 180 tablet 0  . Melatonin 10 MG TABS Take 1 tablet by mouth at bedtime as needed.    Marland Kitchen spironolactone (ALDACTONE) 100 MG tablet Take 100 mg by mouth daily.    . traZODone (DESYREL) 50 MG tablet Take  0.5-1 tablets (25-50 mg total) by mouth at bedtime. insomnia (Patient not taking: Reported on 10/23/2019) 90 tablet 1   No current facility-administered medications for this visit.     Musculoskeletal: Strength & Muscle Tone: UTA Gait & Station: normal Patient leans: N/A  Psychiatric Specialty Exam: Review of Systems  Psychiatric/Behavioral: Negative for agitation, behavioral problems, confusion, decreased concentration, dysphoric mood, hallucinations, self-injury, sleep disturbance and suicidal ideas. The patient is not nervous/anxious and is not hyperactive.   All other systems reviewed and are negative.   There were no vitals taken for this visit.There is no height  or weight on file to calculate BMI.  General Appearance: Casual  Eye Contact:  Fair  Speech:  Clear and Coherent  Volume:  Normal  Mood:  Euthymic  Affect:  Congruent  Thought Process:  Goal Directed and Descriptions of Associations: Intact  Orientation:  Full (Time, Place, and Person)  Thought Content: Logical   Suicidal Thoughts:  No  Homicidal Thoughts:  No  Memory:  Immediate;   Fair Recent;   Fair Remote;   Fair  Judgement:  Fair  Insight:  Fair  Psychomotor Activity:  Normal  Concentration:  Concentration: Fair and Attention Span: Fair  Recall:  [employer] of Knowledge: Fair  Language: Fair  Akathisia:  No  Handed:  Right  AIMS (if indicated):UTA  Assets:  Communication Skills Desire for Improvement Housing Social Support  ADL's:  Intact  Cognition: WNL  Sleep:  Fair   Screenings: PHQ2-9     Office Visit from 05/08/2019 in [facility], [facility] Office Visit from 11/05/2018 in [facility], [facility] Office Visit from 03/21/2018 in [facility], [facility] Office Visit from [facility]/06/2017 in [facility], [facility]  PHQ-2 Total Score  0  0  0  0       Assessment and Plan: Ebony Lawson is a 34 year old Caucasian female, single, employed, lives in [place], has a history of  anxiety, sleep problems was evaluated by telemedicine today.  Patient is biologically predisposed given her family history of mental health problems.  Patient is currently stable on current medication regimen.  Plan as noted below.  Plan GAD-stable Continue CBT Hydroxyzine 10 to 20 mg p.o. daily as needed   Insomnia-stable We will monitor closely.  Discussed with patient since her symptoms are stable she can follow-up with writer in 6 months and if she continues to do well she can be transitioned back to her primary care provider.  However she can continue psychotherapy session with our new therapist here in clinic.  Encouraged patient to call to schedule an appointment soon.  Follow-up in clinic in 6 months or sooner if needed.  I have spent atleast 15 minutes non face to face with patient today. More than 50 % of the time was spent for preparing to see the patient ( e.g., review of test, records ),  ordering medications and test ,psychoeducation and supportive psychotherapy and care coordination,as well as documenting clinical information in electronic health record. This note was generated in part or whole with voice recognition software. Voice recognition is usually quite accurate but there are transcription errors that can and very often do occur. I apologize for any typographical errors that were not detected and corrected.      Ursula Alert, MD 10/23/2019, 5:08 PM

## 2019-11-06 ENCOUNTER — Encounter: Payer: Self-pay | Admitting: Nurse Practitioner

## 2019-11-06 ENCOUNTER — Other Ambulatory Visit: Payer: Self-pay

## 2019-11-06 ENCOUNTER — Telehealth: Payer: Self-pay

## 2019-11-06 ENCOUNTER — Ambulatory Visit: Payer: Managed Care, Other (non HMO) | Admitting: Nurse Practitioner

## 2019-11-06 VITALS — BP 131/65 | HR 84 | Temp 97.6°F | Resp 16 | Ht 60.0 in | Wt 106.0 lb

## 2019-11-06 DIAGNOSIS — R5383 Other fatigue: Secondary | ICD-10-CM

## 2019-11-06 DIAGNOSIS — F411 Generalized anxiety disorder: Secondary | ICD-10-CM | POA: Diagnosis not present

## 2019-11-06 DIAGNOSIS — I499 Cardiac arrhythmia, unspecified: Secondary | ICD-10-CM | POA: Diagnosis not present

## 2019-11-06 MED ORDER — HYDROXYZINE HCL 10 MG PO TABS: 10.0000 mg | ORAL_TABLET | Freq: Two times a day (BID) | ORAL | 1 refills | Status: DC | PRN

## 2019-11-06 NOTE — Telephone Encounter (Signed)
Confirmed and screened for 11-06-19 ov.

## 2019-11-06 NOTE — Progress Notes (Signed)
[facility] [place], [place] [address]  Internal MEDICINE  Office Visit Note  Patient Name: Ebony Lawson  [id number]  [id number]  Date of Service: 11/12/2019  Chief Complaint  Patient presents with   Follow-up   pt feels like heart skips a beat when laying down getting re    The patient is here for routine follow up. She does have history of anxiety. Has been seeing psychiatry for a few years as well as therapist. Takes hydroxyzine as needed for panic type attacks. She takes in the evenings, mostly. Was told per psychiatry, this could be prescribed per primary care provider. The patient will continue to see therapist. She states that she is having some palpitations. When they happen, they are causing her to cough. Initially started happening every few nights. Now is happening nearly every night. Sometimes, this is happening during the day as well. Does not believe this is related to anxiety .      Current Medication: Outpatient Encounter Medications as of 11/06/2019  Medication Sig   Calcium Carbonate-Vitamin D (CALCIUM 600+D3 PO) Take 1 tablet by mouth daily.   drospirenone-ethinyl estradiol (YAZ) 3-0.02 MG tablet Take 1 tablet by mouth daily.   hydrOXYzine (ATARAX/VISTARIL) 10 MG tablet Take 1-2 tablets (10-20 mg total) by mouth 2 (two) times daily as needed. For severe anxiety   spironolactone (ALDACTONE) 100 MG tablet Take 100 mg by mouth daily.   [DISCONTINUED] Adapalene 0.3 % gel APPLY TO THE FACE EVERY MORNING   [DISCONTINUED] hydrOXYzine (ATARAX/VISTARIL) 10 MG tablet Take 1-2 tablets (10-20 mg total) by mouth 2 (two) times daily as needed. For severe anxiety   [DISCONTINUED] Melatonin 10 MG TABS Take 1 tablet by mouth at bedtime as needed.   [DISCONTINUED] traZODone (DESYREL) 50 MG tablet Take 0.5-1 tablets (25-50 mg total) by mouth at bedtime. insomnia   No facility-administered encounter medications on file as of 11/06/2019.    Surgical  History: History reviewed. No pertinent surgical history.  Medical History: Past Medical History:  Diagnosis Date   Heart murmur    Migraine     Family History: Family History  Problem Relation Age of Onset   Hyperlipidemia Mother    Cancer Father        prostate and skin cancer   Hyperlipidemia Father    Heart disease Father        CAD - triple bypass 2014   Hypertension Father    Hearing loss Paternal Grandfather    Hypertension Paternal Grandfather    Anxiety disorder Brother     Social History   Socioeconomic History   Marital status: Single    Spouse name: Not on file   Number of children: 0   Years of education: 16   Highest education level: Bachelor's degree (e.g., BA, AB, BS)  Occupational History   Occupation: DNA Animal nutritionist: [employer]  Tobacco Use   Smoking status: Never Smoker   Smokeless tobacco: Never Used  Substance and Sexual Activity   Alcohol use: Yes    Alcohol/week: 0.0 standard drinks    Comment: 2-3 drinks monthly   Drug use: No   Sexual activity: Yes    Partners: Male    Birth control/protection: OCP    Comment: 1 partner   Other Topics Concern   Not on file  Social History Narrative   Toshi grew up outside of [place]. She attended [employer] and obtained a Manufacturing engineer in [employer] in [place]. 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   Exercise - none at present   Caffeine - rare    Social Determinants of Health   Financial Resource Strain:    Difficulty of Paying Living Expenses:   Food Insecurity:    Worried About Charity fundraiser in the Last Year:    Arboriculturist in the Last Year:   Transportation Needs:    Film/video editor (Medical):    Lack of Transportation (Non-Medical):   Physical Activity:    Days of Exercise per Week:    Minutes of Exercise per Session:   Stress:    Feeling of Stress :   Social Connections:     Frequency of Communication with Friends and Family:    Frequency of Social Gatherings with Friends and Family:    Attends Religious Services:    Active Member of Clubs or Organizations:    Attends Music therapist:    Marital Status:   Intimate Partner Violence:    Fear of Current or Ex-Partner:    Emotionally Abused:    Physically Abused:    Sexually Abused:       Review of Systems  Constitutional: Negative for activity change, chills, fatigue and unexpected weight change.  HENT: Negative for congestion, postnasal drip, rhinorrhea, sneezing and sore throat.   Respiratory: Negative for cough, chest tightness, shortness of breath and wheezing.   Cardiovascular: Positive for palpitations. Negative for chest pain.  Gastrointestinal: Negative for abdominal pain, constipation, diarrhea, nausea and vomiting.  Endocrine: Negative for cold intolerance, heat intolerance, polydipsia and polyuria.  Musculoskeletal: Negative for arthralgias, back pain, joint swelling and neck pain.  Skin: Negative for rash.  Allergic/Immunologic: Negative for environmental allergies.  Neurological: Negative for dizziness, tremors, numbness and headaches.  Hematological: Negative for adenopathy. Does not bruise/bleed easily.  Psychiatric/Behavioral: Positive for dysphoric mood. Negative for behavioral problems (Depression), sleep disturbance and suicidal ideas. The patient is nervous/anxious.        Continues to see counseling services.     Today's Vitals   11/06/19 1445  BP: 131/65  Pulse: 84  Resp: 16  Temp: 97.6 F (36.4 C)  SpO2: 100%  Weight: 106 lb (48.1 kg)  Height: 5' (1.524 m)   Body mass index is 20.7 kg/m.  Physical Exam Vitals and nursing note reviewed.  Constitutional:      General: She is not in acute distress.    Appearance: Normal appearance. She is well-developed. She is not diaphoretic.  HENT:     Head: Normocephalic and atraumatic.     Nose: Nose normal.      Mouth/Throat:     Pharynx: No oropharyngeal exudate.  Eyes:     Pupils: Pupils are equal, round, and reactive to light.  Neck:     Thyroid: No thyromegaly.     Vascular: No JVD.     Trachea: No tracheal deviation.  Cardiovascular:     Rate and Rhythm: Normal rate and regular rhythm.     Heart sounds: Normal heart sounds. No murmur. No friction rub. No gallop.      Comments: eCG today showing -RSR(V1) -nondiagnostic.   PROBABLY NORMAL Pulmonary:     Effort: Pulmonary effort is normal. No respiratory distress.     Breath sounds: Normal breath sounds. No wheezing or rales.  Chest:     Chest wall: No tenderness.  Abdominal:     Palpations:  Abdomen is soft.  Musculoskeletal:        General: Normal range of motion.     Cervical back: Normal range of motion and neck supple.  Lymphadenopathy:     Cervical: No cervical adenopathy.  Skin:    General: Skin is warm and dry.  Neurological:     Mental Status: She is alert and oriented to person, place, and time.     Cranial Nerves: No cranial nerve deficit.  Psychiatric:        Mood and Affect: Mood normal.        Behavior: Behavior normal.        Thought Content: Thought content normal.        Judgment: Judgment normal.    Assessment/Plan: 1. Cardiac arrhythmia, unspecified cardiac arrhythmia type patinet reporting frequent palpitations causing her to cough when they occur. ECG showing -RSR(V1) -nondiagnostic. PROBABLY NORMAL. Will have her keep journal of palpitations and situations occurring when she has them. Continue to monitor.  - EKG 12-Lead  2. Other fatigue Labs ordered for further evaluation.   3. GAD (generalized anxiety disorder) May continue to take hydroxizine 10mg , taking 1 to 2 tablets twice daily as needed for acute anxiety. New prescription sent to her pharmacy today.  - hydrOXYzine (ATARAX/VISTARIL) 10 MG tablet; Take 1-2 tablets (10-20 mg total) by mouth 2 (two) times daily as needed. For severe anxiety   Dispense: 180 tablet; Refill: 1  General Counseling: Nella verbalizes understanding of the findings of todays visit and agrees with plan of treatment. I have discussed any further diagnostic evaluation that may be needed or ordered today. We also reviewed her medications today. she has been encouraged to call the office with any questions or concerns that should arise related to todays visit.  This patient was seen by Leretha Pol FNP Collaboration with Dr Lavera Guise as a part of collaborative care agreement  Orders Placed This Encounter  Procedures   EKG 12-Lead    Meds ordered this encounter  Medications   hydrOXYzine (ATARAX/VISTARIL) 10 MG tablet    Sig: Take 1-2 tablets (10-20 mg total) by mouth 2 (two) times daily as needed. For severe anxiety    Dispense:  180 tablet    Refill:  1    writtent prescription provided 11/06/2019    Order Specific Question:   Supervising Provider    Answer:   Lavera Guise [id number]    Total time spent: 72 Minutes   Time spent includes review of chart, medications, test results, and follow up plan with the patient.      Dr Lavera Guise Internal medicine

## 2019-11-10 ENCOUNTER — Other Ambulatory Visit: Payer: Self-pay

## 2019-11-10 ENCOUNTER — Ambulatory Visit (INDEPENDENT_AMBULATORY_CARE_PROVIDER_SITE_OTHER): Payer: 59 | Admitting: Licensed Clinical Social Worker

## 2019-11-10 DIAGNOSIS — F411 Generalized anxiety disorder: Secondary | ICD-10-CM

## 2019-11-10 NOTE — Progress Notes (Signed)
Virtual Visit via Video Note  I connected with Ebony Lawson on 11/10/19 at  3:30 PM EDT by a video enabled telemedicine application and verified that I am speaking with the correct person using two identifiers.  Location: Patient: home  Provider: [facility]   I discussed the limitations of evaluation and management by telemedicine and the availability of in person appointments. The patient expressed understanding and agreed to proceed.    I discussed the assessment and treatment plan with the patient. The patient was provided an opportunity to ask questions and all were answered. The patient agreed with the plan and demonstrated an understanding of the instructions.   The patient was advised to call back or seek an in-person evaluation if the symptoms worsen or if the condition fails to improve as anticipated.  I provided 60 minutes of non-face-to-face time during this encounter.   Jeilyn Reznik R Tressia Labrum, LCSW   THERAPIST PROGRESS NOTE  Session Time: 60 min  Participation Level: Active  Behavioral Response: Neat and Well GroomedAlertstable  Type of Therapy: Individual Therapy  Treatment Goals addressed: Anxiety and Coping  Interventions: CBT and Supportive  Summary: Ebony Lawson is a 34 y.o. female who presents with continuing situational anxiety.  Discussed previous counseling sessions and allowed pt to express and explore what worked well for her in the past. Pt is currently utilizing progressive muscle relaxation, mindfulness, guided imagery, journaling, and deep breathing resources. Encouraged pt to continue with self-care, utilizing learned resources regularly, and focusing on adequate rest. Pt feels like she is in a good place right now; wants monthly check-ins.  Suicidal/Homicidal: Nowithout intent/plan  Therapist Response: Pt progressing towards decreasing intensity, frequency, and overall level of anxiety. Focus on on anxiety relapse prevention.   Plan: Return again in 4  weeks.  Diagnosis: Axis I: Generalized Anxiety Disorder    Axis II: No diagnosis    Ebony Bo Danetta Prom, LCSW 11/10/2019

## 2019-11-12 DIAGNOSIS — I499 Cardiac arrhythmia, unspecified: Secondary | ICD-10-CM | POA: Insufficient documentation

## 2019-11-12 DIAGNOSIS — R5383 Other fatigue: Secondary | ICD-10-CM | POA: Insufficient documentation

## 2019-11-17 ENCOUNTER — Other Ambulatory Visit: Payer: Self-pay | Admitting: Nurse Practitioner

## 2019-11-18 LAB — COMPREHENSIVE METABOLIC PANEL
ALT: 10 IU/L (ref 0–32)
AST: 13 IU/L (ref 0–40)
Albumin/Globulin Ratio: 1.8 (ref 1.2–2.2)
Albumin: 4.4 g/dL (ref 3.8–4.8)
Alkaline Phosphatase: 36 IU/L — ABNORMAL LOW (ref 48–121)
BUN/Creatinine Ratio: 14 (ref 9–23)
BUN: 10 mg/dL (ref 6–20)
Bilirubin Total: 0.3 mg/dL (ref 0.0–1.2)
CO2: 20 mmol/L (ref 20–29)
Calcium: 9.1 mg/dL (ref 8.7–10.2)
Chloride: 104 mmol/L (ref 96–106)
Creatinine, Ser: 0.7 mg/dL (ref 0.57–1.00)
GFR calc Af Amer: 134 mL/min/{1.73_m2} (ref >59)
GFR calc non Af Amer: 116 mL/min/{1.73_m2} (ref >59)
Globulin, Total: 2.4 g/dL (ref 1.5–4.5)
Glucose: 82 mg/dL (ref 65–99)
Potassium: 4.3 mmol/L (ref 3.5–5.2)
Sodium: 137 mmol/L (ref 134–144)
Total Protein: 6.8 g/dL (ref 6.0–8.5)

## 2019-11-18 LAB — IRON AND TIBC
Iron Saturation: 9 % — CL (ref 15–55)
Iron: 34 ug/dL (ref 27–159)
Total Iron Binding Capacity: 380 ug/dL (ref 250–450)
UIBC: 346 ug/dL (ref 131–425)

## 2019-11-18 LAB — CBC
Hematocrit: 39.3 % (ref 34.0–46.6)
Hemoglobin: 13.3 g/dL (ref 11.1–15.9)
MCH: 27.8 pg (ref 26.6–33.0)
MCHC: 33.8 g/dL (ref 31.5–35.7)
MCV: 82 fL (ref 79–97)
Platelets: 178 10*3/uL (ref 150–450)
RBC: 4.78 x10E6/uL (ref 3.77–5.28)
RDW: 13 % (ref 11.7–15.4)
WBC: 5.5 10*3/uL (ref 3.4–10.8)

## 2019-11-18 LAB — MAGNESIUM: Magnesium: 2 mg/dL (ref 1.6–2.3)

## 2019-11-18 LAB — B12 AND FOLATE PANEL
Folate: 12.8 ng/mL (ref >3.0)
Vitamin B-12: 286 pg/mL (ref 232–1245)

## 2019-11-18 LAB — LIPID PANEL WITH LDL/HDL RATIO
Cholesterol, Total: 182 mg/dL (ref 100–199)
HDL: 59 mg/dL (ref >39)
LDL Chol Calc (NIH): 105 mg/dL — ABNORMAL HIGH (ref 0–99)
LDL/HDL Ratio: 1.8 ratio (ref 0.0–3.2)
Triglycerides: 102 mg/dL (ref 0–149)
VLDL Cholesterol Cal: 18 mg/dL (ref 5–40)

## 2019-11-18 LAB — VITAMIN D 25 HYDROXY (VIT D DEFICIENCY, FRACTURES): Vit D, 25-Hydroxy: 22.3 ng/mL — ABNORMAL LOW (ref 30.0–100.0)

## 2019-11-18 LAB — FERRITIN: Ferritin: 13 ng/mL — ABNORMAL LOW (ref 15–150)

## 2019-11-18 LAB — PHOSPHORUS: Phosphorus: 4.4 mg/dL — ABNORMAL HIGH (ref 3.0–4.3)

## 2019-11-18 LAB — TSH: TSH: 1.84 u[IU]/mL (ref 0.450–4.500)

## 2019-11-18 LAB — T4, FREE: Free T4: 1.28 ng/dL (ref 0.82–1.77)

## 2019-11-21 NOTE — Progress Notes (Signed)
Moderate iron deficiency anemia.

## 2019-12-15 ENCOUNTER — Ambulatory Visit (INDEPENDENT_AMBULATORY_CARE_PROVIDER_SITE_OTHER): Payer: 59 | Admitting: Licensed Clinical Social Worker

## 2019-12-15 ENCOUNTER — Other Ambulatory Visit: Payer: Self-pay

## 2019-12-15 DIAGNOSIS — F411 Generalized anxiety disorder: Secondary | ICD-10-CM

## 2019-12-15 NOTE — Progress Notes (Signed)
Virtual Visit via Video Note  I connected with Ebony Lawson on 12/15/19 at  3:30 PM EDT by a video enabled telemedicine application and verified that I am speaking with the correct person using two identifiers.  Location: Patient: home Provider: [facility]   I discussed the limitations of evaluation and management by telemedicine and the availability of in person appointments. The patient expressed understanding and agreed to proceed.   The patient was advised to call back or seek an in-person evaluation if the symptoms worsen or if the condition fails to improve as anticipated.  I provided 60 minutes of non-face-to-face time during this encounter.   Ebony Docter R Afnan Cadiente, LCSW   THERAPIST PROGRESS NOTE  Session Time: 3:30-4:30  Participation Level: Active  Behavioral Response: Neat and Well GroomedAlertAnxious  Type of Therapy: Individual Therapy  Treatment Goals addressed: Anxiety and Coping  Interventions: CBT and Supportive  Summary: Ebony Lawson is a 34 y.o. female who presents with continuing anxiety/stress.  Some current stressors include work stress, stress about her parents, and stress involving bringing a new kitten into her household with an established pet. Explored relationships with parents and with brother.   Suicidal/Homicidal: No  Therapist Response: There is continued development with [name]'s management of stress/anxiety. Pt reports fewer symptoms than last session.  Discussed current coping mechanisms and ways that pt could improve overall management. Encouraged pt to continue working on relaxation/stress management exercises. Reviewed PMR, journaling, physical activity, pets, mindfulness.   Plan: Return again in 4 weeks. Will continue with CBT and psychoeducation re: mood regulation, anxiety management, and stress management.   Diagnosis: Axis I: Generalized Anxiety Disorder    Axis II: No diagnosis    Rachel Bo Shaquan Puerta, LCSW 12/15/2019

## 2019-12-31 ENCOUNTER — Ambulatory Visit: Payer: Self-pay | Admitting: Dermatology

## 2020-01-05 ENCOUNTER — Ambulatory Visit: Payer: Self-pay | Admitting: Dermatology

## 2020-01-15 ENCOUNTER — Ambulatory Visit (INDEPENDENT_AMBULATORY_CARE_PROVIDER_SITE_OTHER): Payer: 59 | Admitting: Licensed Clinical Social Worker

## 2020-01-15 ENCOUNTER — Other Ambulatory Visit: Payer: Self-pay

## 2020-01-15 DIAGNOSIS — F411 Generalized anxiety disorder: Secondary | ICD-10-CM | POA: Diagnosis not present

## 2020-01-15 NOTE — Progress Notes (Signed)
Virtual Visit via Video Note  I connected with Ebony Heberle on 01/15/20 at  3:30 PM EDT by a video enabled telemedicine application and verified that I am speaking with the correct person using two identifiers.  Location: Patient: home  Provider: [facility]   I discussed the limitations of evaluation and management by telemedicine and the availability of in person appointments. The patient expressed understanding and agreed to proceed.    The patient was advised to call back or seek an in-person evaluation if the symptoms worsen or if the condition fails to improve as anticipated.  I provided 60 minutes of non-face-to-face time during this encounter.   Arrie Zuercher R Almir Botts, LCSW    THERAPIST PROGRESS NOTE  Session Time: 3:30-4:30  Participation Level: Active  Behavioral Response: Neat and Well GroomedAlertAnxious  Type of Therapy: Individual Therapy  Treatment Goals addressed: Anxiety  Interventions: CBT, Supportive and Reframing  Summary: [name] is a 34 y.o. female who presents with improving symptoms related to her diagnosis of anxiety.  Pt reports that she spends lots of time with her cat and friends. Pt reports that she feels very happy when with her cat and with her friends.   Discussed relationship with parents--good relationship. Thinking about this relationship triggers some anxiety with Sallyanne due to her being caregiver to her parents. Father recently fell in the basement with significant injuries. "He won't listen to me when I tell him to limit his time going down there".  Allowed pt to explore and express thoughts and feelings about work stress--recently had to let an employee go. Allowed pt to describe the situation and reviewed the overall psychological impact.  Encouraged pt to continue focusing on stress management, eating healthy, good sleep hygiene, and maintaining stable mood and stress levels.   Suicidal/Homicidal: No  Therapist Response: Delphine  reports that she feels her mood is stable and that she is managing mood and stress/anxiety well.   Plan: Return again in 4 weeks. Ongoing treatment plan to include strategies to improve overall anxiety and mood management.   Diagnosis: Axis I: Generalized Anxiety Disorder    Axis II: No diagnosis    Rachel Bo Leverne Tessler, LCSW 01/15/2020

## 2020-02-03 ENCOUNTER — Encounter: Payer: Self-pay | Admitting: Dermatology

## 2020-02-03 ENCOUNTER — Other Ambulatory Visit: Payer: Self-pay

## 2020-02-03 ENCOUNTER — Ambulatory Visit: Payer: Managed Care, Other (non HMO) | Admitting: Dermatology

## 2020-02-03 VITALS — BP 106/69 | HR 81

## 2020-02-03 DIAGNOSIS — L700 Acne vulgaris: Secondary | ICD-10-CM

## 2020-02-03 MED ORDER — SPIRONOLACTONE 100 MG PO TABS: 100.0000 mg | ORAL_TABLET | Freq: Every day | ORAL | 3 refills | Status: DC

## 2020-02-03 MED ORDER — DOXYCYCLINE MONOHYDRATE 100 MG PO CAPS: 100.0000 mg | ORAL_CAPSULE | Freq: Every day | ORAL | 3 refills | Status: DC

## 2020-02-03 MED ORDER — ADAPALENE 0.3 % EX GEL: CUTANEOUS | 3 refills | Status: DC

## 2020-02-03 NOTE — Progress Notes (Signed)
Follow-Up Visit   Subjective  Ebony Lawson is a 34 y.o. female who presents for the following: Follow-up.  Patient presents today for follow up for Acne, has been using Spironolactone 100 mg QHS and Sulfa cleanse face wash. Patient states that acne seems to be staying the same.  She also takes Yaz BCP.  The following portions of the chart were reviewed this encounter and updated as appropriate:      Review of Systems:  No other skin or systemic complaints except as noted in HPI or Assessment and Plan.  Objective  Well appearing patient in no apparent distress; mood and affect are within normal limits.  A focused examination was performed including face, neck, chest and back. Relevant physical exam findings are noted in the Assessment and Plan.  Objective  Face: Few cystic papules on jaw b/l Multiple small inflammatory papules/pink macules on cheeks b/l    Assessment & Plan  Acne vulgaris Face  Not improving on current therapy.  Discussed isotretinoin course in detail, patient declines at this time. Start Doxycycline Monohydrate 100 mg QD with food Start Adapalene QHS apply small amount to entire face once daily at night Continue Sulfa Cleanse or can sub Panoxyl 4% creamy wash to face daily.  Doxycycline should be taken with food to prevent nausea. Do not lay down for 30 minutes after taking. Be cautious with sun exposure and use good sun protection while on this medication. Pregnant women should not take this medication.   Topical retinoid medications like tretinoin/Retin-A, adapalene/Differin, tazarotene/Fabior, and Epiduo/Epiduo Forte can cause dryness and irritation when first started. Only apply a pea-sized amount to the entire affected area. Avoid applying it around the eyes, edges of mouth and creases at the nose. If you experience irritation, use a good moisturizer first and/or apply the medicine less often. If you are doing well with the medicine, you can increase how  often you use it until you are applying every night. Be careful with sun protection while using this medication as it can make you sensitive to the sun. This medicine should not be used by pregnant women.     Accutane is discussed fully with the patient. It is a very effective drug to treat acne vulgaris but has many significant side effects. Chief among these are teratogenesis, hepatic injury, dyslipidemia and severe drying of the mucous membranes. All of these issues have been discussed in details. Monthly blood tests to monitor lipids and liver functions will be necessary. Expect painful dryness and/or fissuring around the lips, eyes, and other moist areas of the body. Balms may be protective. Contact lens may be too painful to wear temporarily while on this drug. Episodes of significant depression have been reported, including suicidal ideation and attempts in rare cases. It may also cause pseudotumor cerebri and hyperostosis. The patient will report any such changes in mood, depressive symptoms or suicidal thoughts, headaches, joint or bone pains.  Female patients MUST use two simultaneous methods of family planning. Accutane is Category X for pregnancy, meaning it will cause fetal teratogenic malformations, and pregnancy MUST be avoided while on this drug.  The dose is 1- 1.5 mg per kg in two divided doses for 20 weeks.  After discussion of these important issues, she indicates complete understanding of all of the above, and does not wish to proceed with accutane therapy.   doxycycline (MONODOX) 100 MG capsule - Face  Adapalene (DIFFERIN) 0.3 % gel - Face  Return in about 10 weeks (around  04/13/2020) for Acne.  Marene Lenz, CMA, am acting as scribe for Brendolyn Patty, MD .  Documentation: I have reviewed the above documentation for accuracy and completeness, and I agree with the above.  Brendolyn Patty MD

## 2020-02-03 NOTE — Patient Instructions (Addendum)
Recommend daily broad spectrum sunscreen SPF 30+ to sun-exposed areas, reapply every 2 hours as needed. Call for new or changing lesions.  Recommend Panoxyl 4% creamy wash  Spironolactone can cause increased urination and cause blood pressure to decrease. Please watch for signs of lightheadedness and be cautious when changing position. It can sometimes cause breast tenderness or an irregular period in premenopausal women. It can also increase potassium. The increase in potassium usually is not a concern unless you are taking other medicines that also increase potassium, so please be sure your doctor knows all of the other medications you are taking. This medication should not be taken  by pregnant women.  Doxycycline should be taken with food to prevent nausea. Do not lay down for 30 minutes after taking. Be cautious with sun exposure and use good sun protection while on this medication. Pregnant women should not take this medication.   Topical retinoid medications like tretinoin/Retin-A, adapalene/Differin, tazarotene/Fabior, and Epiduo/Epiduo Forte can cause dryness and irritation when first started. Only apply a pea-sized amount to the entire affected area. Avoid applying it around the eyes, edges of mouth and creases at the nose. If you experience irritation, use a good moisturizer first and/or apply the medicine less often. If you are doing well with the medicine, you can increase how often you use it until you are applying every night. Be careful with sun protection while using this medication as it can make you sensitive to the sun. This medicine should not be used by pregnant women.

## 2020-03-01 ENCOUNTER — Other Ambulatory Visit: Payer: Self-pay

## 2020-03-01 ENCOUNTER — Ambulatory Visit (INDEPENDENT_AMBULATORY_CARE_PROVIDER_SITE_OTHER): Payer: 59 | Admitting: Licensed Clinical Social Worker

## 2020-03-01 DIAGNOSIS — F411 Generalized anxiety disorder: Secondary | ICD-10-CM | POA: Diagnosis not present

## 2020-03-01 NOTE — Progress Notes (Signed)
Virtual Visit via Video Note  I connected with Arriyah Nachreiner on 03/01/20 at  3:30 PM EDT by a video enabled telemedicine application and verified that I am speaking with the correct person using two identifiers.  Location: Patient: home  Provider: [facility]   I discussed the limitations of evaluation and management by telemedicine and the availability of in person appointments. The patient expressed understanding and agreed to proceed.   The patient was advised to call back or seek an in-person evaluation if the symptoms worsen or if the condition fails to improve as anticipated.  I provided 60 minutes of non-face-to-face time during this encounter.   Costella Schwarz R Zaden Sako, LCSW    THERAPIST PROGRESS NOTE  Session Time: 3:30-4:30p  Participation Level: Active  Behavioral Response: Neat and Well GroomedAlertAnxious  Type of Therapy: Individual Therapy  Treatment Goals addressed: Anxiety and Coping  Interventions: CBT, Assertiveness Training and Supportive  Summary: Maricarmen Braziel is a 34 y.o. female who presents with improving anxiety related to diagnosis of GAD. Pt reports that her mood is stable and that she feels she is managing anxiety/stressful situations better.   Allowed pt to explore and express thoughts and feelings associated with recent external stressors: health of cat, work-related stress, relationship stress.  Pt continuing to have some anxiety about the health of her pet cats--pt admits that the vet made a comment about her anxiety fueling some of the visits. Discussed levels of when pt feels the pets need to visit the vet versus just waiting it out. The thoughts of not taking her cat in when its having a health-related scare triggers anxiety for pt. Discussed ways of managing stress in that particular moment.  Allowed pt to brainstorm through some work-related scenarios that have triggered stress recently--allowed pt to discuss coping mechanisms that she has used to  manage stress/anxiety in the moment. Reviewed other ways of managing stress/anxiety.   Discussed work relationships and different personalities--which ones pt finds are more difficult than others. Allowed pt to discuss how she manages through the more difficult personalities and difficult scenarios at work. Reviewed managing panic/anxiety within close-knit context of work.   Encouraged pt to focus on self care, life balance, and positive activities that she enjoys. Pt is walking regularly.    Suicidal/Homicidal: No   Therapist Response: Mery continues to make good progress with mood management, stress/anxiety management, self understanding and relational functioning within the workplace.  Plan: Return again in 5 weeks. The ongoing treatment plan includes maintaining current levels of progress and continuing to build skills to manage mood, improve stress/anxiety management, emotion regulation, distress tolerance, and behavior modification.   Diagnosis: Axis I: Generalized Anxiety Disorder    Axis II: No diagnosis    Rachel Bo Josian Lanese, LCSW 03/01/2020

## 2020-03-14 NOTE — Progress Notes (Signed)
Recheck labs at next visit

## 2020-04-05 ENCOUNTER — Other Ambulatory Visit: Payer: Self-pay

## 2020-04-05 ENCOUNTER — Ambulatory Visit (INDEPENDENT_AMBULATORY_CARE_PROVIDER_SITE_OTHER): Payer: 59 | Admitting: Licensed Clinical Social Worker

## 2020-04-05 DIAGNOSIS — F411 Generalized anxiety disorder: Secondary | ICD-10-CM

## 2020-04-05 NOTE — Progress Notes (Signed)
Virtual Visit via Video Note  I connected with Azarie Fauble on 04/05/20 at  3:30 PM EDT by a video enabled telemedicine application and verified that I am speaking with the correct person using two identifiers.  Location: Patient: home Provider: [facility]   I discussed the limitations of evaluation and management by telemedicine and the availability of in person appointments. The patient expressed understanding and agreed to proceed.  I discussed the assessment and treatment plan with the patient. The patient was provided an opportunity to ask questions and all were answered. The patient agreed with the plan and demonstrated an understanding of the instructions.   The patient was advised to call back or seek an in-person evaluation if the symptoms worsen or if the condition fails to improve as anticipated.  I provided 60 minutes of non-face-to-face time during this encounter.   Tyris Eliot R Marigny Borre, LCSW    THERAPIST PROGRESS NOTE  Session Time: 3:30-4:30p  Participation Level: Active  Behavioral Response: Neat and Well GroomedAlertAnxious  Type of Therapy: Individual Therapy  Treatment Goals addressed: Anxiety and Coping  Interventions: Supportive  Summary: Minh Jasper is a 34 y.o. female who presents with improving symptoms related to anxiety.  Pt reports that she is sleeping better and feels that she is managing overall stress/anxiety better, especially worrying about her pet cat.  Allowed pt to explore and express thoughts and feelings about recent life stressors:  Work-related stress, new relationship, family-oriented stress, worrying about health of cat.   Pt reporting that work stress is improving--pt had a problem employee that quit suddenly. Pt feels this is double-edged because the work-related drama and issues are gone, but now she is short-staffed. Pt feels comfortable in overall present work environment. Helped pt identify current positive factors at  work.  Allowed pt to explore thoughts and feelings related to new relationship--person is in [place] so it is a long distance relationship. Pt has some anxiety about it but admits that technology is helpful. Pt and friend see each other on the weekends. Pt identified positives with the new relationship.  Allowed pt to express recent trips: hiking trip with parents and trip [place] with pet cat. Discussed father's memory loss and overall psychological impact. Discussed the book "meet me where I am" and how it could be helpful to both patient and mother.   Reviewed overall anxiety/stress management techniques and encouraged pt to continue focusing on self-care and life balance.  Suicidal/Homicidal: No  SI, HI, or AVH reported at time of session.  Therapist Response: Lilee reports a decrease in intensity of anxiety and is continuing to develop cognitive strategies to reduce situational/irrational anxiety, which is reflective of overall progress.   Plan: Return again in 4 weeks. The ongoing treatment plan includes maintaining current levels of progress and continuing to build skills to manage mood, improve stress/anxiety management, emotion regulation, distress tolerance, and behavior modification.   Diagnosis: Axis I: Generalized Anxiety Disorder    Axis II: No diagnosis    Rachel Bo Juanetta Negash, LCSW 04/05/2020

## 2020-04-06 ENCOUNTER — Ambulatory Visit: Payer: Managed Care, Other (non HMO) | Admitting: Dermatology

## 2020-04-15 ENCOUNTER — Other Ambulatory Visit: Payer: Self-pay

## 2020-04-15 ENCOUNTER — Telehealth (INDEPENDENT_AMBULATORY_CARE_PROVIDER_SITE_OTHER): Payer: 59 | Admitting: Psychiatry

## 2020-04-15 ENCOUNTER — Encounter: Payer: Self-pay | Admitting: Psychiatry

## 2020-04-15 DIAGNOSIS — F411 Generalized anxiety disorder: Secondary | ICD-10-CM | POA: Diagnosis not present

## 2020-04-15 DIAGNOSIS — F5105 Insomnia due to other mental disorder: Secondary | ICD-10-CM

## 2020-04-15 NOTE — Progress Notes (Signed)
Virtual Visit via Video Note  I connected with Ebony Lawson on 04/15/20 at  4:40 PM EST by a video enabled telemedicine application and verified that I am speaking with the correct person using two identifiers.  Location Provider Location : [facility] Patient Location : Home  Participants: Patient , Provider    I discussed the limitations of evaluation and management by telemedicine and the availability of in person appointments. The patient expressed understanding and agreed to proceed.    I discussed the assessment and treatment plan with the patient. The patient was provided an opportunity to ask questions and all were answered. The patient agreed with the plan and demonstrated an understanding of the instructions.   The patient was advised to call back or seek an in-person evaluation if the symptoms worsen or if the condition fails to improve as anticipated.   Ebony Lawson OP Progress Note  04/15/2020 5:20 PM Ebony Lawson  MRN:  [id number]  Chief Complaint:  Chief Complaint    Follow-up     HPI: Ebony Lawson is a 34 year old Caucasian female, single, employed, lives in [place], has a history of GAD, insomnia was evaluated by telemedicine today.  Patient today reports she is currently doing well with regards to her anxiety.  She does have psychosocial stressors of her father who is struggling with dementia.  She reports she recently took a trip with her dad and needed few hydroxyzine tablets at that time since her anxiety was triggered by watching her dad's agitation and memory problems.  She however is coping well at this time.  She continues to be in therapy and reports therapy sessions as beneficial.  Patient denies any suicidality, homicidality or perceptual disturbances.  She was able to talk to her primary care provider who will be willing to manage her hydroxyzine.  She hence is ready to transition back to her primary care provider.  Patient denies any other concerns  today.  Visit Diagnosis:    ICD-10-CM   1. 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  Past Medical History:  Past Medical History:  Diagnosis Date  . Heart murmur   . Migraine    History reviewed. No pertinent surgical history.  Family Psychiatric History: I have reviewed family psychiatric history from my progress note on 02/15/2018.  Family History:  Family History  Problem Relation Age of Onset  . Hyperlipidemia Mother   . Cancer Father        prostate and skin cancer  . Hyperlipidemia Father   . Heart disease Father        CAD - triple bypass 2014  . Hypertension Father   . Hearing loss Paternal Grandfather   . Hypertension Paternal Grandfather   . Anxiety disorder Brother     Social History: I have reviewed social history from my progress note on 02/15/2018. Social History   Socioeconomic History  . Marital status: Single    Spouse name: Not on file  . Number of children: 0  . Years of education: 16  . Highest education level: Bachelor's degree (e.g., BA, AB, BS)  Occupational History  . Occupation: DNA Animal nutritionist: [employer]  Tobacco Use  . Smoking status: Never Smoker  . Smokeless tobacco: Never Used  Vaping Use  . Vaping Use: Never used  Substance  and Sexual Activity  . Alcohol use: Yes    Alcohol/week: 0.0 standard drinks    Comment: 2-3 drinks monthly  . Drug use: No  . Sexual activity: Yes    Partners: Male    Birth control/protection: OCP    Comment: 1 partner   Other Topics Concern  . Not on file  Social History Narrative   Berthe grew up outside of [facility]. 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 Difficulty of Paying Living Expenses: Not on file  Food Insecurity:   . Worried About Charity fundraiser in the Last Year: Not on file  . Ran Out of Food in the Last Year: Not on file  Transportation Needs:   . Lack of Transportation (Medical): Not on file  . Lack of Transportation (Non-Medical): Not on file  Physical Activity:   . Days of Exercise per Week: Not on file  . Minutes of Exercise per Session: Not on file  Stress:   . Feeling of Stress : Not on file  Social Connections:   . Frequency of Communication with Friends and Family: Not on file  . Frequency of Social Gatherings with Friends and Family: Not on file  . Attends Religious Services: Not on file  . Active Member of Clubs or Organizations: Not on file  . Attends Archivist Meetings: Not on file  . Marital Status: Not on file    Allergies: No Known Allergies  Metabolic Disorder Labs: Lab Results  Component Value Date   HGBA1C 5.2 12/31/2014   No results found for: PROLACTIN Lab Results  Component Value Date   CHOL 182 11/17/2019   TRIG 102 11/17/2019   HDL 59 11/17/2019   LDLCALC 105 (H) 11/17/2019   LDLCALC 70 04/03/2018   Lab Results  Component Value Date   TSH 1.840 11/17/2019   TSH 1.790 05/12/2019    Therapeutic Level Labs: No results found for: LITHIUM No results found for: VALPROATE No components found for:  CBMZ  Current Medications: Current Outpatient Medications  Medication Sig Dispense Refill  . Adapalene (DIFFERIN) 0.3 % gel Apply pea size amount to entire face once every night 45 g 3  . Calcium Carbonate-Vitamin D (CALCIUM 600+D3 PO) Take 1 tablet by mouth daily.    Marland Kitchen doxycycline (MONODOX) 100 MG capsule Take 1 capsule (100 mg total) by mouth daily. 30 capsule 3  . drospirenone-ethinyl estradiol (YAZ) 3-0.02 MG tablet Take 1 tablet by mouth daily. 3 Package 3  . hydrOXYzine (ATARAX/VISTARIL) 10 MG tablet Take 1-2  tablets (10-20 mg total) by mouth 2 (two) times daily as needed. For severe anxiety 180 tablet 1  . spironolactone (ALDACTONE) 100 MG tablet Take 1 tablet (100 mg total) by mouth daily. 30 tablet 3   No current facility-administered medications for this visit.     Musculoskeletal: Strength & Muscle Tone: UTA Gait & Station: normal Patient leans: N/A  Psychiatric Specialty Exam: Review of Systems  Psychiatric/Behavioral: Negative for agitation, behavioral problems, confusion, decreased concentration, dysphoric mood, hallucinations, self-injury, sleep disturbance and suicidal ideas. The patient is not nervous/anxious and is not hyperactive.   All other systems reviewed and are negative.   There were no  vitals taken for this visit.There is no height or weight on file to calculate BMI.  General Appearance: Casual  Eye Contact:  Fair  Speech:  Clear and Coherent  Volume:  Normal  Mood:  Euthymic  Affect:  Congruent  Thought Process:  Goal Directed and Descriptions of Associations: Intact  Orientation:  Full (Time, Place, and Person)  Thought Content: Logical   Suicidal Thoughts:  No  Homicidal Thoughts:  No  Memory:  Immediate;   Fair Recent;   Fair Remote;   Fair  Judgement:  Fair  Insight:  Fair  Psychomotor Activity:  Normal  Concentration:  Concentration: Fair and Attention Span: Fair  Recall:  [employer] of Knowledge: Fair  Language: Fair  Akathisia:  No  Handed:  Right  AIMS (if indicated): UTA  Assets:  Communication Skills Desire for Improvement Housing Social Support  ADL's:  Intact  Cognition: WNL  Sleep:  Fair   Screenings: PHQ2-9     Office Visit from 11/06/2019 in [facility], [facility] Office Visit from 05/08/2019 in [facility], [facility] Office Visit from 11/05/2018 in [facility], [facility] Office Visit from 03/21/2018 in [facility], [facility] Office Visit from 09/06/2017 in [facility], [facility]  PHQ-2 Total Score 0 0 0 0  0       Assessment and Plan: Mckenley Birenbaum is a 35 year old Caucasian female, single, employed, lives in [place], has a history of GAD, insomnia, was evaluated by telemedicine today.  Patient is biologically predisposed given her family history of mental health problems.  Patient continues to be stable.  We will transition her care back to her primary care provider.  Plan GAD-stable Continue CBT as needed Continue hydroxyzine 10 to 20 mg p.o. daily as needed  Insomnia-stable Continue sleep hygiene techniques  Follow-up in clinic as needed.  I have spent atleast 10 minutes face to face by video with patient today. More than 50 % of the time was spent for preparing to see the patient ( e.g., review of test, records ), ordering medications and test ,psychoeducation and supportive psychotherapy and care coordination,as well as documenting clinical information in electronic health record. This note was generated in part or whole with voice recognition software. Voice recognition is usually quite accurate but there are transcription errors that can and very often do occur. I apologize for any typographical errors that were not detected and corrected.        Ursula Alert, Lawson 04/15/2020, 5:20 PM

## 2020-05-06 ENCOUNTER — Other Ambulatory Visit: Payer: Self-pay

## 2020-05-06 ENCOUNTER — Ambulatory Visit (INDEPENDENT_AMBULATORY_CARE_PROVIDER_SITE_OTHER): Payer: 59 | Admitting: Licensed Clinical Social Worker

## 2020-05-06 DIAGNOSIS — F411 Generalized anxiety disorder: Secondary | ICD-10-CM | POA: Diagnosis not present

## 2020-05-06 NOTE — Progress Notes (Signed)
Virtual Visit via Video Note  I connected with Ebony Lawson on 05/06/20 at  3:30 PM EST by a video enabled telemedicine application and verified that I am speaking with the correct person using two identifiers.  Location: Patient: home Provider: [facility]   I discussed the limitations of evaluation and management by telemedicine and the availability of in person appointments. The patient expressed understanding and agreed to proceed.  The patient was advised to call back or seek an in-person evaluation if the symptoms worsen or if the condition fails to improve as anticipated.  I provided 60 minutes of non-face-to-face time during this encounter.   Shaletha Humble R Haevyn Ury, LCSW   THERAPIST PROGRESS NOTE  Session Time: 3:30-4:30p  Participation Level: Active  Behavioral Response: Neat and Well GroomedAlertAnxious  Type of Therapy: Individual Therapy  Treatment Goals addressed: Anxiety and Coping  Interventions: Solution Focused and Supportive  Summary: Ebony Lawson is a 34 y.o. female who presents with improving symptoms related to anxiety disorder diagnosis. Pt reports that she feels she is managing anxiety and actually feeling fewer anxiety symptoms than in previous sessions.  Allowed pt to explore and express thoughts and feelings related to current work environment, relationships, concerns about pets, family members, and overall life circumstances. Pt reports that she feels very happy about life generally right now and is happy about her overall progress throughout 2021.  Encouraged pt to continue focusing on overall self care, engaging in positive social supports, and life balance.   Suicidal/Homicidal: No  Therapist Response: Dymon continues to make good progress with self care, life management, managing mood, and managing stress/anxiety. This is reflective of maintaining current levels of progress. Treatment showing good evolution and development.  Plan: Return again in 4  weeks.The ongoing treatment plan includes maintaining current levels of progress and continuing to build skills to manage mood, improve stress/anxiety management, emotion regulation, distress tolerance, and behavior modification.   Diagnosis: Axis I: Generalized Anxiety Disorder    Axis II: No diagnosis    Rachel Bo Darik Massing, LCSW 05/06/2020

## 2020-05-10 ENCOUNTER — Encounter: Payer: Self-pay | Admitting: Nurse Practitioner

## 2020-05-10 ENCOUNTER — Other Ambulatory Visit: Payer: Self-pay

## 2020-05-10 ENCOUNTER — Ambulatory Visit (INDEPENDENT_AMBULATORY_CARE_PROVIDER_SITE_OTHER): Payer: Managed Care, Other (non HMO) | Admitting: Nurse Practitioner

## 2020-05-10 VITALS — BP 115/67 | HR 77 | Temp 98.3°F | Resp 16 | Ht 60.0 in | Wt 111.2 lb

## 2020-05-10 DIAGNOSIS — D508 Other iron deficiency anemias: Secondary | ICD-10-CM

## 2020-05-10 DIAGNOSIS — Z124 Encounter for screening for malignant neoplasm of cervix: Secondary | ICD-10-CM

## 2020-05-10 DIAGNOSIS — F411 Generalized anxiety disorder: Secondary | ICD-10-CM

## 2020-05-10 DIAGNOSIS — Z112 Encounter for screening for other bacterial diseases: Secondary | ICD-10-CM

## 2020-05-10 DIAGNOSIS — Z0001 Encounter for general adult medical examination with abnormal findings: Secondary | ICD-10-CM

## 2020-05-10 DIAGNOSIS — Z113 Encounter for screening for infections with a predominantly sexual mode of transmission: Secondary | ICD-10-CM

## 2020-05-10 DIAGNOSIS — R300 Dysuria: Secondary | ICD-10-CM

## 2020-05-10 DIAGNOSIS — R87820 Cervical low risk human papillomavirus (HPV) DNA test positive: Secondary | ICD-10-CM

## 2020-05-10 MED ORDER — FERRALET 90 90-1 MG PO TABS: 1.0000 | ORAL_TABLET | Freq: Every day | ORAL | 5 refills | Status: DC

## 2020-05-10 NOTE — Progress Notes (Signed)
[facility] [place], [place] [address]  Internal MEDICINE  Office Visit Note  Patient Name: Ebony Lawson  [id number]  [id number]  Date of Service: 06/09/2020   Pt is here for routine health maintenance examination  Chief Complaint  Patient presents with  . Annual Exam  . Migraine  . controlled substance form    reviewed with PT     The patient is here for health maintenance exam and pap smear.  -history of normal pap smears with HPV positive. Has consulted with GYN in the past. No cervical changes identified. Will continue to get pap smears in primary care yearly per recommendations for cervical cancer screening. Will refer to GYN again if any changes occur.  -She has asked to be screened for sexually transmitted infections. She denies abnormal vaginal discharge, pelvic pain, or evidence of illness.  -well managed depression/anxiety. She does see psychiatry and counselor.  - routine labs done 11/2019 indicated she did have iron deficiency anemia. She has been on oral supplementation since then. Should be rechecked.     Current Medication: Outpatient Encounter Medications as of 05/10/2020  Medication Sig  . Adapalene (DIFFERIN) 0.3 % gel Apply pea size amount to entire face once every night  . Calcium Carbonate-Vitamin D (CALCIUM 600+D3 PO) Take 1 tablet by mouth daily.  Marland Kitchen doxycycline (MONODOX) 100 MG capsule Take 1 capsule (100 mg total) by mouth daily.  . drospirenone-ethinyl estradiol (YAZ) 3-0.02 MG tablet Take 1 tablet by mouth daily.  . hydrOXYzine (ATARAX/VISTARIL) 10 MG tablet Take 1-2 tablets (10-20 mg total) by mouth 2 (two) times daily as needed. For severe anxiety  . spironolactone (ALDACTONE) 100 MG tablet Take 1 tablet (100 mg total) by mouth daily.  Marland Kitchen Fe Cbn-Fe Gluc-FA-B12-C-DSS (FERRALET 90) 90-1 MG TABS Take 1 tablet by mouth daily.   No facility-administered encounter medications on file as of 05/10/2020.    Surgical  History: History reviewed. No pertinent surgical history.  Medical History: Past Medical History:  Diagnosis Date  . Heart murmur   . Migraine     Family History: Family History  Problem Relation Age of Onset  . Hyperlipidemia Mother   . Cancer Father        prostate and skin cancer  . Hyperlipidemia Father   . Heart disease Father        CAD - triple bypass 2014  . Hypertension Father   . Hearing loss Paternal Grandfather   . Hypertension Paternal Grandfather   . Anxiety disorder Brother       Review of Systems  Constitutional: Negative for activity change, chills, fatigue and unexpected weight change.  HENT: Negative for congestion, postnasal drip, rhinorrhea, sneezing and sore throat.   Respiratory: Negative for cough, chest tightness, shortness of breath and wheezing.   Cardiovascular: Negative for chest pain and palpitations.  Gastrointestinal: Negative for abdominal pain, constipation, diarrhea, nausea and vomiting.  Endocrine: Negative for cold intolerance, heat intolerance, polydipsia and polyuria.  Genitourinary: Negative for dyspareunia, dysuria, frequency and urgency.  Musculoskeletal: Negative for arthralgias, back pain, joint swelling and neck pain.  Skin: Negative for rash.  Allergic/Immunologic: Negative for environmental allergies.  Neurological: Negative for dizziness, tremors, numbness and headaches.  Hematological: Negative for adenopathy. Does not bruise/bleed easily.  Psychiatric/Behavioral: Positive for dysphoric mood. Negative for behavioral problems (Depression), sleep disturbance and suicidal ideas. The patient is nervous/anxious.        Continues to see counseling services.      Today's Vitals  05/10/20 1417  BP: 115/67  Pulse: 77  Resp: 16  Temp: 98.3 F (36.8 C)  SpO2: 97%  Weight: 111 lb 3.2 oz (50.4 kg)  Height: 5' (1.524 m)   Body mass index is 21.72 kg/m.  Physical Exam Vitals and nursing note reviewed.  Constitutional:       General: She is not in acute distress.    Appearance: Normal appearance. She is well-developed and well-nourished. She is not diaphoretic.  HENT:     Head: Normocephalic and atraumatic.     Nose: Nose normal.     Mouth/Throat:     Mouth: Oropharynx is clear and moist.     Pharynx: No oropharyngeal exudate.  Eyes:     Extraocular Movements: EOM normal.     Pupils: Pupils are equal, round, and reactive to light.  Neck:     Thyroid: No thyromegaly.     Vascular: No JVD.     Trachea: No tracheal deviation.  Cardiovascular:     Rate and Rhythm: Normal rate and regular rhythm.     Pulses: Normal pulses.     Heart sounds: Normal heart sounds. No murmur heard. No friction rub. No gallop.   Pulmonary:     Effort: Pulmonary effort is normal. No respiratory distress.     Breath sounds: Normal breath sounds. No wheezing or rales.  Chest:     Chest wall: No tenderness.  Breasts:     Right: Normal. No swelling, bleeding, inverted nipple, mass, nipple discharge, skin change, tenderness or axillary adenopathy.     Left: Normal. No swelling, bleeding, inverted nipple, mass, nipple discharge, skin change, tenderness or axillary adenopathy.    Abdominal:     General: Bowel sounds are normal.     Palpations: Abdomen is soft.     Tenderness: There is no abdominal tenderness.  Genitourinary:    General: Normal vulva.     Exam position: Supine.     Labia:        Right: No rash, tenderness or lesion.        Left: No rash, tenderness or lesion.      Vagina: No signs of injury and foreign body. No vaginal discharge, erythema, tenderness or bleeding.     Cervix: No cervical motion tenderness, discharge, friability or lesion.     Uterus: Normal.      Adnexa: Right adnexa normal and left adnexa normal.     Comments: No tenderness, masses, or organomeglay present during bimanual exam . Musculoskeletal:        General: Normal range of motion.     Cervical back: Normal range of motion and neck  supple.  Lymphadenopathy:     Cervical: No cervical adenopathy.     Upper Body:     Right upper body: No axillary adenopathy.     Left upper body: No axillary adenopathy.  Skin:    General: Skin is warm and dry.     Capillary Refill: Capillary refill takes 2 to 3 seconds.  Neurological:     General: No focal deficit present.     Mental Status: She is alert and oriented to person, place, and time.     Cranial Nerves: No cranial nerve deficit.  Psychiatric:        Mood and Affect: Mood and affect and mood normal.        Behavior: Behavior normal.        Thought Content: Thought content normal.        Judgment:  Judgment normal.      LABS: Recent Results (from the past 2160 hour(s))  IGP, Aptima HPV     Status: Abnormal   Collection Time: 05/10/20  3:14 PM  Result Value Ref Range   Interpretation NILM     Comment: NEGATIVE FOR INTRAEPITHELIAL LESION OR MALIGNANCY.   Category NIL     Comment: Negative for Intraepithelial Lesion   Adequacy ENDO     Comment: Satisfactory for evaluation. Endocervical and/or squamous metaplastic cells (endocervical component) are present.    Clinician Provided ICD10 Comment     Comment: Z12.4   Performed by: Comment     Comment: Vernell Barrier, Cytotechnologist (ASCP)   Note: Comment     Comment: The Pap smear is a screening test designed to aid in the detection of premalignant and malignant conditions of the uterine cervix.  It is not a diagnostic procedure and should not be used as the sole means of detecting cervical cancer.  Both false-positive and false-negative reports do occur.    Test Methodology Comment     Comment: This liquid based ThinPrep(R) pap test was screened with the use of an image guided system.    HPV Aptima Positive (A) Negative    Comment: This nucleic acid amplification test detects fourteen high-risk HPV types (16,18,31,33,35,39,45,51,52,56,58,59,66,68) without differentiation.    Chlamydia/Gonococcus/Trichomonas, NAA     Status: None   Collection Time: 05/10/20  3:33 PM   Specimen: Urine   Urine  Result Value Ref Range   Chlamydia by NAA Negative Negative   Gonococcus by NAA Negative Negative   Trich vag by NAA Negative Negative  UA/M w/rflx Culture, Routine     Status: Abnormal   Collection Time: 05/10/20  3:33 PM  Result Value Ref Range   Specific Gravity, UA 1.024 1.005 - 1.030   pH, UA 5.5 5.0 - 7.5   Color, UA Yellow Yellow   Appearance Ur Cloudy (A) Clear   Leukocytes,UA Trace (A) Negative   Protein,UA Negative Negative/Trace   Glucose, UA Negative Negative   Ketones, UA Negative Negative   RBC, UA Negative Negative   Bilirubin, UA Negative Negative   Urobilinogen, Ur 0.2 0.2 - 1.0 mg/dL   Nitrite, UA Negative Negative   Microscopic Examination See below:     Comment: Microscopic was indicated and was performed.   Urinalysis Reflex Comment     Comment: This specimen has reflexed to a Urine Culture.  Microscopic Examination     Status: None   Collection Time: 05/10/20  3:33 PM  Result Value Ref Range   WBC, UA 0-5 0 - 5 /hpf   RBC 0-2 0 - 2 /hpf   Epithelial Cells (non renal) 0-10 0 - 10 /hpf   Casts None seen None seen /lpf   Bacteria, UA Few None seen/Few  Urine Culture, Reflex     Status: None   Collection Time: 05/10/20  3:33 PM  Result Value Ref Range   Urine Culture, Routine CANCELED     Comment: Test not performed. We have received your request for additional testing. We are not able to add the test(s) requested.  Result canceled by the ancillary.   CBC     Status: None   Collection Time: 05/11/20  8:55 AM  Result Value Ref Range   WBC 5.7 3.4 - 10.8 x10E3/uL   RBC 4.93 3.77 - 5.28 x10E6/uL   Hemoglobin 14.0 11.1 - 15.9 g/dL   Hematocrit 40.9 34.0 - 46.6 %   MCV 83  79 - 97 fL   MCH 28.4 26.6 - 33.0 pg   MCHC 34.2 31.5 - 35.7 g/dL   RDW 12.7 11.7 - 15.4 %   Platelets 186 150 - 450 x10E3/uL  Iron and TIBC     Status: None    Collection Time: 05/11/20  8:55 AM  Result Value Ref Range   Total Iron Binding Capacity 407 250 - 450 ug/dL   UIBC 270 131 - 425 ug/dL   Iron 137 27 - 159 ug/dL   Iron Saturation 34 15 - 55 %  HIV Antibody (routine testing w rflx)     Status: None   Collection Time: 05/11/20  8:55 AM  Result Value Ref Range   HIV Screen 4th Generation wRfx Non Reactive Non Reactive  HSV 2 antibody, IgG     Status: None   Collection Time: 05/11/20  8:55 AM  Result Value Ref Range   HSV 2 IgG, Type Spec <0.91 0.00 - 0.90 index    Comment:                                  Negative        <0.91                                  Equivocal 0.91 - 1.09                                  Positive        >1.09  Note: Negative indicates no antibodies detected to  HSV-2. Equivocal may suggest early infection.  If  clinically appropriate, retest at later date. 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   RPR Qual     Status: None   Collection Time: 05/11/20  8:55 AM  Result Value Ref Range   RPR Ser Ql Non Reactive Non Reactive  Ferritin     Status: Abnormal   Collection Time: 05/11/20  8:55 AM  Result Value Ref Range   Ferritin 13 (L) 15 - 150 ng/mL     Assessment/Plan: 1. Encounter for general adult medical examination with abnormal findings Annual health maintenance exam today with pap smear.   2. Other iron deficiency anemia Check iron panel. Will continue iron supplement as indicated per lab results. - Fe Cbn-Fe Gluc-FA-B12-C-DSS (FERRALET 90) 90-1 MG TABS; Take 1 tablet by mouth daily.  Dispense: 30 tablet; Refill: 5  3. GAD (generalized anxiety disorder) May take hydroxyzine as needed for acute anxiety. Continue regular visits with counselor as scheduled.   4. Routine cervical smear - IGP, Aptima HPV  5. 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 Will refer back to GYN as indicated per recommendations for  cervical cancer screening.   6. Screening examination for sexually transmitted disease Screening for gonorrhea, chlamydia, and trichomoniasis. Will treat as indicated.  - Chlamydia/Gonococcus/Trichomonas, NAA  7. Dysuria - UA/M w/rflx Culture, Routine  General Counseling: Zeva verbalizes understanding of the findings of todays visit and agrees with plan of treatment. I have discussed any further diagnostic evaluation that may be needed or ordered today. We also reviewed her medications today. she has been encouraged to call the office with any questions or concerns that should arise  related to todays visit.    Counseling:  This patient was seen by Leretha Pol FNP Collaboration with Dr Lavera Guise as a part of collaborative care agreement  Orders Placed This Encounter  Procedures  . Chlamydia/Gonococcus/Trichomonas, NAA  . Microscopic Examination  . Urine Culture, Reflex  . UA/M w/rflx Culture, Routine  . UA/M w/rflx Culture, Routine    Meds ordered this encounter  Medications  . Fe Cbn-Fe Gluc-FA-B12-C-DSS (FERRALET 90) 90-1 MG TABS    Sig: Take 1 tablet by mouth daily.    Dispense:  30 tablet    Refill:  5    Iron and B12 deficiency.    Order Specific Question:   Supervising Provider    Answer:   Lavera Guise [[id number]]    Total time spent: 26 Minutes  Time spent includes review of chart, medications, test results, and follow up plan with the patient.     Lavera Guise, MD  Internal Medicine

## 2020-05-11 ENCOUNTER — Other Ambulatory Visit: Payer: Self-pay | Admitting: Nurse Practitioner

## 2020-05-11 ENCOUNTER — Other Ambulatory Visit: Payer: Self-pay

## 2020-05-11 ENCOUNTER — Ambulatory Visit: Payer: Managed Care, Other (non HMO) | Admitting: Dermatology

## 2020-05-11 VITALS — BP 107/64 | HR 94

## 2020-05-11 DIAGNOSIS — L700 Acne vulgaris: Secondary | ICD-10-CM | POA: Diagnosis not present

## 2020-05-11 MED ORDER — WINLEVI 1 % EX CREA: TOPICAL_CREAM | CUTANEOUS | 1 refills | Status: DC

## 2020-05-11 NOTE — Progress Notes (Signed)
Follow-Up Visit   Subjective  Ebony Lawson is a 34 y.o. female who presents for the following: Acne (patient has noticed some improvement since adding Doxycycline 100mg  po QD, and Adapalene 0.3% gel QHS). She has continued to use the Spironolactone 100mg  po QD and Sulfacleanse wash and OBC Yaz, and has noticed still getting significant acne flares around menses.   The following portions of the chart were reviewed this encounter and updated as appropriate:   Tobacco  Allergies  Meds  Problems  Med Hx  Surg Hx  Fam Hx      Review of Systems:  No other skin or systemic complaints except as noted in HPI or Assessment and Plan.  Objective  Well appearing patient in no apparent distress; mood and affect are within normal limits.  A focused examination was performed including face, neck, chest and back. Relevant physical exam findings are noted in the Assessment and Plan.  Objective  Face: Trace open comedones and few small inflammatory papules on the cheeks and forehead. Trace open comedones and rare inflammatory papule on the chest and back.   Assessment & Plan  Acne vulgaris Face  Chronic condition with expected duration over one year. Condition is bothersome to patient. Currently flared despite treatment.  Discussed Isotretinoin and potential side effects. She defers at this time.   Discussed Doxycycline 100 mg BID and potential risk of loss of normal gut and skin bacteria. Recommend trying to decrease Doxycycline to 20mg  po BID at visit in six weeks if acne has improved.   Samples given of Winlevi apply a thin coat to the face BID. Will send rx to [facility].   Continue Spironolactone 100mg  po QD. Spironolactone can cause increased urination and cause blood pressure to decrease. Please watch for signs of lightheadedness and be cautious when changing position. It can sometimes cause breast tenderness or an irregular period in premenopausal women. It can also increase  potassium. The increase in potassium usually is not a concern unless you are taking other medicines that also increase potassium, so please be sure your doctor knows all of the other medications you are taking. This medication should not be taken by pregnant women.  This medicine should also not be taken together with sulfa drugs like Bactrim (trimethoprim/sulfamethexazole).   Continue Sulfacleanse wash daily.   Continue Doxycycline 100mg  po QD. Doxycycline should be taken with food to prevent nausea. Do not lay down for 30 minutes after taking. Be cautious with sun exposure and use good sun protection while on this medication. Pregnant women should not take this medication.   Continue Adapalene 0.3% gel QHS. Topical retinoid medications like tretinoin/Retin-A, adapalene/Differin, tazarotene/Fabior, and Epiduo/Epiduo Forte can cause dryness and irritation when first started. Only apply a pea-sized amount to the entire affected area. Avoid applying it around the eyes, edges of mouth and creases at the nose. If you experience irritation, use a good moisturizer first and/or apply the medicine less often. If you are doing well with the medicine, you can increase how often you use it until you are applying every night. Be careful with sun protection while using this medication as it can make you sensitive to the sun. This medicine should not be used by pregnant women.    Clascoterone (WINLEVI) 1 % CREA - Face  Other Related Medications doxycycline (MONODOX) 100 MG capsule Adapalene (DIFFERIN) 0.3 % gel  Return in about 6 weeks (around 06/22/2020).  Luther Redo, CMA, am acting as scribe for [employer],  MD .  Documentation: I have reviewed the above documentation for accuracy and completeness, and I agree with the above.  Forest Gleason, MD

## 2020-05-12 LAB — CBC
Hematocrit: 40.9 % (ref 34.0–46.6)
Hemoglobin: 14 g/dL (ref 11.1–15.9)
MCH: 28.4 pg (ref 26.6–33.0)
MCHC: 34.2 g/dL (ref 31.5–35.7)
MCV: 83 fL (ref 79–97)
Platelets: 186 10*3/uL (ref 150–450)
RBC: 4.93 x10E6/uL (ref 3.77–5.28)
RDW: 12.7 % (ref 11.7–15.4)
WBC: 5.7 10*3/uL (ref 3.4–10.8)

## 2020-05-12 LAB — HSV 2 ANTIBODY, IGG: HSV 2 IgG, Type Spec: <0.91 index (ref 0.00–0.90)

## 2020-05-12 LAB — IRON AND TIBC
Iron Saturation: 34 % (ref 15–55)
Iron: 137 ug/dL (ref 27–159)
Total Iron Binding Capacity: 407 ug/dL (ref 250–450)
UIBC: 270 ug/dL (ref 131–425)

## 2020-05-12 LAB — IGP, APTIMA HPV: HPV Aptima: POSITIVE — AB

## 2020-05-12 LAB — FERRITIN: Ferritin: 13 ng/mL — ABNORMAL LOW (ref 15–150)

## 2020-05-12 LAB — RPR QUALITATIVE: RPR Ser Ql: NONREACTIVE

## 2020-05-12 LAB — HIV ANTIBODY (ROUTINE TESTING W REFLEX): HIV Screen 4th Generation wRfx: NONREACTIVE

## 2020-05-12 NOTE — Progress Notes (Signed)
Pap unchanged. Normal with HPV positive. Repeat in one year. Waiting on other test results.

## 2020-05-13 LAB — UA/M W/RFLX CULTURE, ROUTINE
Bilirubin, UA: NEGATIVE
Glucose, UA: NEGATIVE
Ketones, UA: NEGATIVE
Nitrite, UA: NEGATIVE
Protein,UA: NEGATIVE
RBC, UA: NEGATIVE
Specific Gravity, UA: 1.024 (ref 1.005–1.030)
Urobilinogen, Ur: 0.2 mg/dL (ref 0.2–1.0)
pH, UA: 5.5 (ref 5.0–7.5)

## 2020-05-13 LAB — URINE CULTURE, REFLEX

## 2020-05-13 LAB — MICROSCOPIC EXAMINATION: Casts: NONE SEEN /lpf

## 2020-05-13 LAB — CHLAMYDIA/GONOCOCCUS/TRICHOMONAS, NAA
Chlamydia by NAA: NEGATIVE
Gonococcus by NAA: NEGATIVE
Trich vag by NAA: NEGATIVE

## 2020-06-02 ENCOUNTER — Encounter: Payer: Self-pay | Admitting: Dermatology

## 2020-06-09 DIAGNOSIS — Z0001 Encounter for general adult medical examination with abnormal findings: Secondary | ICD-10-CM | POA: Insufficient documentation

## 2020-06-09 DIAGNOSIS — Z113 Encounter for screening for infections with a predominantly sexual mode of transmission: Secondary | ICD-10-CM | POA: Insufficient documentation

## 2020-06-09 DIAGNOSIS — D649 Anemia, unspecified: Secondary | ICD-10-CM | POA: Insufficient documentation

## 2020-06-10 ENCOUNTER — Other Ambulatory Visit: Payer: Self-pay

## 2020-06-10 ENCOUNTER — Ambulatory Visit: Payer: 59 | Admitting: Licensed Clinical Social Worker

## 2020-06-10 ENCOUNTER — Ambulatory Visit (INDEPENDENT_AMBULATORY_CARE_PROVIDER_SITE_OTHER): Payer: Managed Care, Other (non HMO)

## 2020-06-10 DIAGNOSIS — E538 Deficiency of other specified B group vitamins: Secondary | ICD-10-CM | POA: Diagnosis not present

## 2020-06-10 MED ORDER — CYANOCOBALAMIN 1000 MCG/ML IJ SOLN
1000.0000 ug | Freq: Once | INTRAMUSCULAR | Status: AC
  Administered 2020-06-10: 1000 ug via INTRAMUSCULAR

## 2020-06-24 ENCOUNTER — Ambulatory Visit: Payer: Managed Care, Other (non HMO) | Admitting: Dermatology

## 2020-07-05 ENCOUNTER — Other Ambulatory Visit: Payer: Self-pay

## 2020-07-05 ENCOUNTER — Ambulatory Visit (INDEPENDENT_AMBULATORY_CARE_PROVIDER_SITE_OTHER): Payer: 59 | Admitting: Licensed Clinical Social Worker

## 2020-07-05 DIAGNOSIS — F411 Generalized anxiety disorder: Secondary | ICD-10-CM

## 2020-07-05 NOTE — Progress Notes (Signed)
Virtual Visit via Video Note  I connected with Ebony Lawson on 07/05/20 at  3:00 PM EST by a video enabled telemedicine application and verified that I am speaking with the correct person using two identifiers.  Location: Patient: home Provider: [facility]   I discussed the limitations of evaluation and management by telemedicine and the availability of in person appointments. The patient expressed understanding and agreed to proceed.  I discussed the assessment and treatment plan with the patient. The patient was provided an opportunity to ask questions and all were answered. The patient agreed with the plan and demonstrated an understanding of the instructions.   The patient was advised to call back or seek an in-person evaluation if the symptoms worsen or if the condition fails to improve as anticipated.  I provided 60 minutes of non-face-to-face time during this encounter.   Ebony Lawson R Ebony Talford, LCSW    THERAPIST PROGRESS NOTE  Session Time: 3-4p  Participation Level: Active  Behavioral Response: Neat and Well GroomedAlertAnxious  Type of Therapy: Individual Therapy  Treatment Goals addressed: Develop strategies to reduce anxiety symptoms; Manage physical healthcare conditions and cope with related caregiver stress  Interventions: CBT, Solution Focused and Supportive  Summary: Ebony Lawson is a 35 y.o. female who presents with symptoms consistent with anxiety disorder. Pt reports recent stress triggered by mother having back compression fracture. 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 Pt is working abbreviated schedule at work to accommodate parents. Pt also has brother living with parents to help out.    Pt reports that she is feeling stressed because she is doing a lot of work in fewer hours since her schedule is now working half days "I'm taking all this vacation time but its not really a vacation at all".  Pt feels that she is juggling work,  caring for her parents, and personal life. Reinforced need for pt to prioritize self care and life balance to help her overcome caregiver stress.   Discussed father's continuing symptoms related to dementia/memory loss diagnosis. Ebony Lawson wasn't aware that some symptoms happening--especially sundowners. Pt states that sometimes they go into battles over "reality" versus "dads reality".  Discussed the book "meet me where I am" and how influential it can be for how caregivers relate to loved ones with memory loss.   Reviewed importance of continuing focus on overall physical and emotional health, self care, life balance, and positive social supports.   Suicidal/Homicidal: No  Therapist Response: Ebony Lawson reports that she is free of panic episodes most days--only had one episode, which is a reduction from past reports. Ebony Lawson is able to identify two coping mechanisms that help manage stress/anxiety. Ebony Lawson also articulated feeling more positive about self and abilities at session today.  These objectives are evidence to support that Ebony Lawson is progressing towards her goals but have not yet met all of them. Treatment to continue as indicated.   Plan: Return again in 8 weeks.  Diagnosis: Axis I: Generalized Anxiety Disorder    Axis II: No diagnosis    Ebony Bo Ebony Menchaca, LCSW 07/05/2020

## 2020-07-07 ENCOUNTER — Other Ambulatory Visit: Payer: Self-pay | Admitting: Dermatology

## 2020-07-07 DIAGNOSIS — L700 Acne vulgaris: Secondary | ICD-10-CM

## 2020-07-09 ENCOUNTER — Ambulatory Visit (INDEPENDENT_AMBULATORY_CARE_PROVIDER_SITE_OTHER): Payer: Managed Care, Other (non HMO)

## 2020-07-09 DIAGNOSIS — E538 Deficiency of other specified B group vitamins: Secondary | ICD-10-CM | POA: Diagnosis not present

## 2020-07-09 MED ORDER — CYANOCOBALAMIN 1000 MCG/ML IJ SOLN
1000.0000 ug | Freq: Once | INTRAMUSCULAR | Status: AC
  Administered 2020-07-09: 1000 ug via INTRAMUSCULAR

## 2020-08-05 ENCOUNTER — Other Ambulatory Visit: Payer: Self-pay

## 2020-08-05 ENCOUNTER — Ambulatory Visit (INDEPENDENT_AMBULATORY_CARE_PROVIDER_SITE_OTHER): Payer: Managed Care, Other (non HMO)

## 2020-08-05 DIAGNOSIS — E538 Deficiency of other specified B group vitamins: Secondary | ICD-10-CM | POA: Diagnosis not present

## 2020-08-05 MED ORDER — CYANOCOBALAMIN 1000 MCG/ML IJ SOLN
1000.0000 ug | Freq: Once | INTRAMUSCULAR | Status: AC
  Administered 2020-08-05: 1000 ug via INTRAMUSCULAR

## 2020-08-06 ENCOUNTER — Ambulatory Visit: Payer: Managed Care, Other (non HMO)

## 2020-08-09 ENCOUNTER — Other Ambulatory Visit: Payer: Self-pay | Admitting: Dermatology

## 2020-08-09 DIAGNOSIS — L700 Acne vulgaris: Secondary | ICD-10-CM

## 2020-08-30 ENCOUNTER — Other Ambulatory Visit: Payer: Self-pay

## 2020-08-30 ENCOUNTER — Ambulatory Visit (INDEPENDENT_AMBULATORY_CARE_PROVIDER_SITE_OTHER): Payer: 59 | Admitting: Licensed Clinical Social Worker

## 2020-08-30 DIAGNOSIS — F411 Generalized anxiety disorder: Secondary | ICD-10-CM | POA: Diagnosis not present

## 2020-08-30 NOTE — Progress Notes (Signed)
Virtual Visit via Video Note  Lawson connected with Ebony Lawson on 08/30/20 at  4:00 PM EDT by a video enabled telemedicine application and verified that Lawson am speaking with the correct person using two identifiers.  Location: Patient: home Provider: remote office [place], [place])   Lawson discussed the limitations of evaluation and management by telemedicine and the availability of in person appointments. The patient expressed understanding and agreed to proceed.  Lawson discussed the assessment and treatment plan with the patient. The patient was provided an opportunity to ask questions and all were answered. The patient agreed with the plan and demonstrated an understanding of the instructions.   The patient was advised to call back or seek an in-person evaluation if the symptoms worsen or if the condition fails to improve as anticipated.  Lawson provided 60 minutes of non-face-to-face time during this encounter.   Ebony Lawson R Ebony Mcconahy, LCSW    THERAPIST PROGRESS NOTE  Session Time: 4-5p  Participation Level: Active  Behavioral Response: Neat and Well GroomedAlertEuthymic  Type of Therapy: Family Therapy  Treatment Goals addressed: Coping  Interventions: Supportive  Summary: Ebony Lawson is a 35 y.o. female who presents with iimproving symptoms related to anxiety diagnosis. Ebony Lawson reports that overall mood has been stable and that she is managing stress and anxiety well. Pt reports that she is trying to be active throughout the day and is engaging socially with friends and family members regularly.  Discussed relationship-related concerns and allowed pt to process through thoughts and feelings triggered by the stress of having a long distance relationship. Pt feels that they see each other quite a bit--pts boyfriend will come over multiple days of the week and she will visit him 1 weekend a month. Pt worried about the future and whether the expectation will be for her to move to the [place]  area since boyfriend just purchased a home.  Continued recommendations are as follows: self care behaviors, positive social engagements, focusing on overall work/home/life balance, and focusing on positive physical and emotional wellness.    Suicidal/Homicidal: No  Therapist Response: Ebony Lawson is continuing to develop behavioral and cognitive strategies to reduce or eliminate the irrational anxiety. Ebony Lawson is intentionally implementing appropriate relaxaiton and diversion activities to decrease overall levels of anxiety. [name] is discussing in therapy a list of key conflicts that are triggering fear and worry. [name] talks through these and brainstorms through potential solutions with therapist. These behaviors are reflective of personal growth and progress. Treatment to continue.   Plan: Return again in 6 weeks.  Diagnosis: Axis Lawson: Generalized Anxiety Disorder    Axis II: No diagnosis    Ebony Ebony Lawson Ebony Hochstetler, LCSW 08/30/2020

## 2020-09-06 ENCOUNTER — Other Ambulatory Visit: Payer: Self-pay

## 2020-09-06 ENCOUNTER — Ambulatory Visit (INDEPENDENT_AMBULATORY_CARE_PROVIDER_SITE_OTHER): Payer: Managed Care, Other (non HMO)

## 2020-09-06 DIAGNOSIS — E538 Deficiency of other specified B group vitamins: Secondary | ICD-10-CM

## 2020-09-06 MED ORDER — CYANOCOBALAMIN 1000 MCG/ML IJ SOLN
1000.0000 ug | Freq: Once | INTRAMUSCULAR | Status: AC
  Administered 2020-09-06: 1000 ug via INTRAMUSCULAR

## 2020-09-09 ENCOUNTER — Other Ambulatory Visit: Payer: Self-pay

## 2020-09-09 ENCOUNTER — Ambulatory Visit (INDEPENDENT_AMBULATORY_CARE_PROVIDER_SITE_OTHER): Payer: Managed Care, Other (non HMO) | Admitting: Dermatology

## 2020-09-09 ENCOUNTER — Ambulatory Visit: Payer: Managed Care, Other (non HMO) | Admitting: Hospice and Palliative Medicine

## 2020-09-09 DIAGNOSIS — L700 Acne vulgaris: Secondary | ICD-10-CM | POA: Diagnosis not present

## 2020-09-09 MED ORDER — DOXYCYCLINE MONOHYDRATE 100 MG PO CAPS: 100.0000 mg | ORAL_CAPSULE | Freq: Two times a day (BID) | ORAL | 2 refills | Status: DC

## 2020-09-09 MED ORDER — AMZEEQ 4 % EX FOAM: CUTANEOUS | 2 refills | Status: DC

## 2020-09-09 NOTE — Patient Instructions (Addendum)
Continue spironolactone 100mg  once daily Continue doxycycline 100mg  once daily with food Continue adapalene 0.3% gel nightly Start Amzeeq nightly following adapalene 0.3%  Spironolactone can cause increased urination and cause blood pressure to decrease. Please watch for signs of lightheadedness and be cautious when changing position. It can sometimes cause breast tenderness or an irregular period in premenopausal women. It can also increase potassium. The increase in potassium usually is not a concern unless you are taking other medicines that also increase potassium, so please be sure your doctor knows all of the other medications you are taking. This medication should not be taken by pregnant women.  This medicine should also not be taken together with sulfa drugs like Bactrim (trimethoprim/sulfamethexazole).   Doxycycline should be taken with food to prevent nausea. Do not lay down for 30 minutes after taking. Be cautious with sun exposure and use good sun protection while on this medication. Pregnant women should not take this medication.   Topical retinoid medications like tretinoin/Retin-A, adapalene/Differin, tazarotene/Fabior, and Epiduo/Epiduo Forte can cause dryness and irritation when first started. Only apply a pea-sized amount to the entire affected area. Avoid applying it around the eyes, edges of mouth and creases at the nose. If you experience irritation, use a good moisturizer first and/or apply the medicine less often. If you are doing well with the medicine, you can increase how often you use it until you are applying every night. Be careful with sun protection while using this medication as it can make you sensitive to the sun. This medicine should not be used by pregnant women.    Recommend taking Heliocare sun protection supplement daily in sunny weather for additional sun protection. For maximum protection on the sunniest days, you can take up to 2 capsules of regular Heliocare OR  take 1 capsule of Heliocare Ultra. For prolonged exposure (such as a full day in the sun), you can repeat your dose of the supplement 4 hours after your first dose. Heliocare can be purchased at [facility] or at [email or web address].    Recommend daily broad spectrum sunscreen SPF 30+ to sun-exposed areas, reapply every 2 hours as needed. Call for new or changing lesions.  Staying in the shade or wearing long sleeves, sun glasses (UVA+UVB protection) and wide brim hats (4-inch brim around the entire circumference of the hat) are also recommended for sun protection.

## 2020-09-09 NOTE — Progress Notes (Signed)
Follow-Up Visit   Subjective  Ebony Lawson is a 35 y.o. female who presents for the following: Acne (Patient here for 4 month acne follow up. She is taking doxycycline 100mg  QD, spironolactone 100mg  QD and using adapalene 0.3% gel QHS. She also used Winlevi for a few months but is not using right now. Patient advises acne has improved. ).  Patient is feeling good on medications with no side effects. She occasionally gets dry with adapalene 0.3% but is using moisturizer.    The following portions of the chart were reviewed this encounter and updated as appropriate:   Tobacco  Allergies  Meds  Problems  Med Hx  Surg Hx  Fam Hx      Review of Systems:  No other skin or systemic complaints except as noted in HPI or Assessment and Plan.  Objective  Well appearing patient in no apparent distress; mood and affect are within normal limits.  A focused examination was performed including face, neck, chest and back. Relevant physical exam findings are noted in the Assessment and Plan.  Objective  Face: Face with trace open comedones, scattered inflammatory papules mostly at jaw and chin Rare inflammatory papule at chest and back   Assessment & Plan  Acne vulgaris Face  Chronic condition with duration over one year. Condition is bothersome to patient. Not currently at goal.  BP 107/70  Continue spironolactone 100mg  QD. Continue doxycycline monohydrate 100mg  increasing to BID with food . Continue adapalene 0.3% gel QHS. Start Amzeeq nightly following adapalene 0.3%. May consider increasing spironolactone in future. Pt has deferred isotretinoin in past.   Spironolactone can cause increased urination and cause blood pressure to decrease. Please watch for signs of lightheadedness and be cautious when changing position. It can sometimes cause breast tenderness or an irregular period in premenopausal women. It can also increase potassium. The increase in potassium usually is not a  concern unless you are taking other medicines that also increase potassium, so please be sure your doctor knows all of the other medications you are taking. This medication should not be taken by pregnant women.  This medicine should also not be taken together with sulfa drugs like Bactrim (trimethoprim/sulfamethexazole).   Doxycycline should be taken with food to prevent nausea. Do not lay down for 30 minutes after taking. Be cautious with sun exposure and use good sun protection while on this medication. Pregnant women should not take this medication.   Topical retinoid medications like tretinoin/Retin-A, adapalene/Differin, tazarotene/Fabior, and Epiduo/Epiduo Forte can cause dryness and irritation when first started. Only apply a pea-sized amount to the entire affected area. Avoid applying it around the eyes, edges of mouth and creases at the nose. If you experience irritation, use a good moisturizer first and/or apply the medicine less often. If you are doing well with the medicine, you can increase how often you use it until you are applying every night. Be careful with sun protection while using this medication as it can make you sensitive to the sun. This medicine should not be used by pregnant women.    Ordered Medications: Minocycline HCl Micronized (AMZEEQ) 4 % FOAM doxycycline (MONODOX) 100 MG capsule  Other Related Medications Adapalene (DIFFERIN) 0.3 % gel doxycycline (MONODOX) 100 MG capsule  Return in about 3 months (around 12/09/2020) for Acne.  Graciella Belton, RMA, am acting as scribe for Forest Gleason, MD .  Documentation: I have reviewed the above documentation for accuracy and completeness, and I agree with the above.  VIRGINA  Laurence Ferrari, MD

## 2020-09-13 ENCOUNTER — Other Ambulatory Visit: Payer: Self-pay | Admitting: Hospice and Palliative Medicine

## 2020-09-15 ENCOUNTER — Other Ambulatory Visit: Payer: Self-pay | Admitting: Hospice and Palliative Medicine

## 2020-09-15 DIAGNOSIS — R5383 Other fatigue: Secondary | ICD-10-CM

## 2020-09-17 LAB — COMPREHENSIVE METABOLIC PANEL
ALT: 11 IU/L (ref 0–32)
AST: 15 IU/L (ref 0–40)
Albumin/Globulin Ratio: 1.6 (ref 1.2–2.2)
Albumin: 4.4 g/dL (ref 3.8–4.8)
Alkaline Phosphatase: 41 IU/L — ABNORMAL LOW (ref 44–121)
BUN/Creatinine Ratio: 20 (ref 9–23)
BUN: 14 mg/dL (ref 6–20)
Bilirubin Total: 0.5 mg/dL (ref 0.0–1.2)
CO2: 19 mmol/L — ABNORMAL LOW (ref 20–29)
Calcium: 9.3 mg/dL (ref 8.7–10.2)
Chloride: 103 mmol/L (ref 96–106)
Creatinine, Ser: 0.69 mg/dL (ref 0.57–1.00)
Globulin, Total: 2.7 g/dL (ref 1.5–4.5)
Glucose: 86 mg/dL (ref 65–99)
Potassium: 4.5 mmol/L (ref 3.5–5.2)
Sodium: 139 mmol/L (ref 134–144)
Total Protein: 7.1 g/dL (ref 6.0–8.5)
eGFR: 118 mL/min/{1.73_m2} (ref >59)

## 2020-09-17 LAB — LIPID PANEL WITH LDL/HDL RATIO
Cholesterol, Total: 177 mg/dL (ref 100–199)
HDL: 69 mg/dL (ref >39)
LDL Chol Calc (NIH): 94 mg/dL (ref 0–99)
LDL/HDL Ratio: 1.4 ratio (ref 0.0–3.2)
Triglycerides: 76 mg/dL (ref 0–149)
VLDL Cholesterol Cal: 14 mg/dL (ref 5–40)

## 2020-09-17 LAB — CBC WITH DIFFERENTIAL/PLATELET
Basophils Absolute: 0 10*3/uL (ref 0.0–0.2)
Basos: 0 %
EOS (ABSOLUTE): 0.1 10*3/uL (ref 0.0–0.4)
Eos: 2 %
Hematocrit: 40.1 % (ref 34.0–46.6)
Hemoglobin: 13.4 g/dL (ref 11.1–15.9)
Immature Grans (Abs): 0 10*3/uL (ref 0.0–0.1)
Immature Granulocytes: 0 %
Lymphocytes Absolute: 1.7 10*3/uL (ref 0.7–3.1)
Lymphs: 28 %
MCH: 27.4 pg (ref 26.6–33.0)
MCHC: 33.4 g/dL (ref 31.5–35.7)
MCV: 82 fL (ref 79–97)
Monocytes Absolute: 0.5 10*3/uL (ref 0.1–0.9)
Monocytes: 7 %
Neutrophils Absolute: 4 10*3/uL (ref 1.4–7.0)
Neutrophils: 63 %
Platelets: 190 10*3/uL (ref 150–450)
RBC: 4.89 x10E6/uL (ref 3.77–5.28)
RDW: 12.7 % (ref 11.7–15.4)
WBC: 6.3 10*3/uL (ref 3.4–10.8)

## 2020-09-17 LAB — FOLATE: Folate: 16.7 ng/mL (ref >3.0)

## 2020-09-17 LAB — IRON AND TIBC
Iron Saturation: 20 % (ref 15–55)
Iron: 68 ug/dL (ref 27–159)
Total Iron Binding Capacity: 344 ug/dL (ref 250–450)
UIBC: 276 ug/dL (ref 131–425)

## 2020-09-17 LAB — VITAMIN B12: Vitamin B-12: 581 pg/mL (ref 232–1245)

## 2020-09-17 LAB — TSH+FREE T4
Free T4: 1.22 ng/dL (ref 0.82–1.77)
TSH: 1.22 u[IU]/mL (ref 0.450–4.500)

## 2020-09-17 LAB — VITAMIN D 25 HYDROXY (VIT D DEFICIENCY, FRACTURES): Vit D, 25-Hydroxy: 36 ng/mL (ref 30.0–100.0)

## 2020-09-17 LAB — FERRITIN: Ferritin: 28 ng/mL (ref 15–150)

## 2020-09-18 NOTE — Progress Notes (Signed)
Labs reviewed, will discuss at upcoming visit.

## 2020-09-20 ENCOUNTER — Encounter: Payer: Self-pay | Admitting: Dermatology

## 2020-09-24 ENCOUNTER — Other Ambulatory Visit: Payer: Self-pay

## 2020-09-24 ENCOUNTER — Encounter: Payer: Self-pay | Admitting: Hospice and Palliative Medicine

## 2020-09-24 ENCOUNTER — Ambulatory Visit: Payer: Managed Care, Other (non HMO) | Admitting: Hospice and Palliative Medicine

## 2020-09-24 VITALS — BP 110/80 | HR 82 | Temp 97.5°F | Resp 16 | Ht 60.0 in | Wt 113.8 lb

## 2020-09-24 DIAGNOSIS — D508 Other iron deficiency anemias: Secondary | ICD-10-CM | POA: Diagnosis not present

## 2020-09-24 DIAGNOSIS — F5105 Insomnia due to other mental disorder: Secondary | ICD-10-CM | POA: Diagnosis not present

## 2020-09-24 DIAGNOSIS — F411 Generalized anxiety disorder: Secondary | ICD-10-CM

## 2020-09-24 NOTE — Progress Notes (Signed)
[facility] [place], [place] [address]  Internal MEDICINE  Office Visit Note  Patient Name: Ebony Lawson  [id number]  [id number]  Date of Service: 10/03/2020  Chief Complaint  Patient presents with  . Follow-up    Discuss blood work    HPI Patient is here for routine follow-up Reviewed most recent labs--all stable Has been taking Ferralet for iron supplementation--iron studies have improved since starting supplement Tolerating supplement well--denies issues with constipation  Followed by dermatology for cystic acne--has not yet started spironolactone due to concerns of lowering her BP  Feeling well today, sleeping well without difficulty, remains active daily No acute issues or complaints today   Current Medication: Outpatient Encounter Medications as of 09/24/2020  Medication Sig  . Adapalene (DIFFERIN) 0.3 % gel Apply pea size amount to entire face once every night  . Calcium Carbonate-Vitamin D (CALCIUM 600+D3 PO) Take 1 tablet by mouth daily.  Marland Kitchen doxycycline (MONODOX) 100 MG capsule TAKE 1 CAPSULE(100 MG) BY MOUTH DAILY  . doxycycline (MONODOX) 100 MG capsule Take 1 capsule (100 mg total) by mouth 2 (two) times daily.  . drospirenone-ethinyl estradiol (YAZ) 3-0.02 MG tablet Take 1 tablet by mouth daily.  Marland Kitchen Fe Cbn-Fe Gluc-FA-B12-C-DSS (FERRALET 90) 90-1 MG TABS Take 1 tablet by mouth daily.  . hydrOXYzine (ATARAX/VISTARIL) 10 MG tablet Take 1-2 tablets (10-20 mg total) by mouth 2 (two) times daily as needed. For severe anxiety  . Minocycline HCl Micronized (AMZEEQ) 4 % FOAM Apply thin coat to face once a day as directed  . spironolactone (ALDACTONE) 100 MG tablet Take 1 tablet (100 mg total) by mouth daily.   No facility-administered encounter medications on file as of 09/24/2020.    Surgical History: History reviewed. No pertinent surgical history.  Medical History: Past Medical History:  Diagnosis Date  . Heart murmur   . Migraine      Family History: Family History  Problem Relation Age of Onset  . Hyperlipidemia Mother   . Cancer Father        prostate and skin cancer  . Hyperlipidemia Father   . Heart disease Father        CAD - triple bypass 2014  . Hypertension Father   . Hearing loss Paternal Grandfather   . Hypertension Paternal Grandfather   . Anxiety disorder Brother     Social History   Socioeconomic History  . Marital status: Single    Spouse name: Not on file  . Number of children: 0  . Years of education: 16  . Highest education level: Bachelor's degree (e.g., BA, AB, BS)  Occupational History  . Occupation: DNA Animal nutritionist: [employer]  Tobacco Use  . Smoking status: Never Smoker  . Smokeless tobacco: Never Used  Vaping Use  . Vaping Use: Never used  Substance and Sexual Activity  . Alcohol use: Yes    Alcohol/week: 0.0 standard drinks    Comment: 2-3 drinks monthly  . Drug use: No  . Sexual activity: Yes    Partners: Male    Birth control/protection: OCP    Comment: 1 partner   Other Topics Concern  . Not on file  Social History Narrative   [name] grew up outside of [facility]. 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  HENT: Negative for ear pain, postnasal drip and sinus pressure.   Eyes: Negative for photophobia, discharge, redness, itching and visual disturbance.  Respiratory: Negative for cough, shortness of breath and wheezing.    Cardiovascular: Negative for chest pain, palpitations and leg swelling.  Gastrointestinal: Negative for abdominal pain, constipation, diarrhea, nausea and vomiting.  Genitourinary: Negative for dysuria and flank pain.  Musculoskeletal: Negative for arthralgias, back pain, gait problem and neck pain.  Skin: Negative for color change.  Allergic/Immunologic: Negative for environmental allergies and food allergies.  Neurological: Negative for dizziness and headaches.  Hematological: Does not bruise/bleed easily.  Psychiatric/Behavioral: Negative for agitation, behavioral problems (depression) and hallucinations.    Vital Signs: BP 110/80   Pulse 82   Temp (!) 97.5 F (36.4 C)   Resp 16   Ht 5' (1.524 m)   Wt 113 lb 12.8 oz (51.6 kg)   SpO2 99%   BMI 22.23 kg/m    Physical Exam Vitals reviewed.  Constitutional:      Appearance: Normal appearance. She is normal weight.  Cardiovascular:     Rate and Rhythm: Normal rate and regular rhythm.     Pulses: Normal pulses.     Heart sounds: Normal heart sounds.  Pulmonary:     Effort: Pulmonary effort is normal.     Breath sounds: Normal breath sounds.  Abdominal:     General: Abdomen is flat.     Palpations: Abdomen is soft.  Musculoskeletal:        General: Normal range of motion.     Cervical back: Normal range of motion.  Skin:    General: Skin is warm.  Neurological:     General: No focal deficit present.     Mental Status: She is alert and oriented to person, place, and time. Mental status is at baseline.  Psychiatric:        Mood and Affect: Mood normal.        Behavior: Behavior normal.        Thought Content: Thought content normal.        Judgment: Judgment normal.    Assessment/Plan: 1. Other iron deficiency anemia Improved iron studies, continue with iron supplementation and monitoring  2. 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 I have discussed any further diagnostic evaluation that may be needed or ordered today. We also reviewed her medications today. she has been encouraged to call the office with any questions or concerns that should arise related to todays visit.   Time spent: 30 Minutes Time spent includes review of chart, medications, test results and follow-up plan with the patient.  This patient was seen by Theodoro Grist AGNP-C in Collaboration with Dr Lavera Guise as a part of collaborative care agreement     Tanna Furry. Nathaly Dawkins AGNP-C Internal medicine

## 2020-10-03 ENCOUNTER — Encounter: Payer: Self-pay | Admitting: Hospice and Palliative Medicine

## 2020-10-03 ENCOUNTER — Other Ambulatory Visit: Payer: Self-pay | Admitting: Nurse Practitioner

## 2020-10-03 DIAGNOSIS — Z30011 Encounter for initial prescription of contraceptive pills: Secondary | ICD-10-CM

## 2020-10-04 ENCOUNTER — Ambulatory Visit (INDEPENDENT_AMBULATORY_CARE_PROVIDER_SITE_OTHER): Payer: 59 | Admitting: Licensed Clinical Social Worker

## 2020-10-04 ENCOUNTER — Other Ambulatory Visit: Payer: Self-pay

## 2020-10-04 DIAGNOSIS — F411 Generalized anxiety disorder: Secondary | ICD-10-CM | POA: Diagnosis not present

## 2020-10-04 NOTE — Progress Notes (Signed)
Virtual Visit via Video Note  I connected with Ebony Lawson on 10/04/20 at  4:00 PM EDT by a video enabled telemedicine application and verified that I am speaking with the correct person using two identifiers.  Location: Patient: home Provider: remote office [place], [place])   I discussed the limitations of evaluation and management by telemedicine and the availability of in person appointments. The patient expressed understanding and agreed to proceed.   I discussed the assessment and treatment plan with the patient. The patient was provided an opportunity to ask questions and all were answered. The patient agreed with the plan and demonstrated an understanding of the instructions.   The patient was advised to call back or seek an in-person evaluation if the symptoms worsen or if the condition fails to improve as anticipated.  I provided 45 minutes of non-face-to-face time during this encounter.   Lynasia Meloche R Jaleena Viviani, LCSW    THERAPIST PROGRESS NOTE  Session Time: 4-4:45p  Participation Level: Active  Behavioral Response: Neat and Well GroomedAlertAnxious  Type of Therapy: Individual Therapy  Treatment Goals addressed: Anxiety  Interventions: CBT and Reframing  Summary: Ebony Lawson is a 35 y.o. female who presents with improved symptoms related to generalized anxiety disorder diagnosis. Pt reports that overall mood has been stable and that she is managing stress/anxiety well. Pt states that she is getting good quality and quantity of sleep--still waking up at night at times.  Allowed pt to explore and express thoughts and feelings related to external stressors. Pt discussed recent stress associated with her cat eating a leaf outside and getting ill from it. Pt managed the situation well and contacted veterinary support quickly and followed all instructions. The cat was okay--still not feeling well a few days later.   Discussed upcoming events--pt going to [place] with  boyfriend, going to a wedding, and has boyfriend's birthday to look forward to.  Reviewed stress and overall stress management. Emailed pt packet about stress management.   Continued recommendations are as follows: self care behaviors, positive social engagements, focusing on overall work/home/life balance, and focusing on positive physical and emotional wellness.    Suicidal/Homicidal: No  Therapist Response: Ebony Lawson is continuing to develop behavioral and cognitive strategies to reduce or eliminate the irrational anxiety. Ebony Lawson is intentionally implementing appropriate relaxaiton and diversion activities to decrease overall levels of anxiety. Ebony Lawson is discussing in therapy a list of key conflicts that are triggering fear and worry. Ebony Lawson talks through these and brainstorms through potential solutions with therapist. These behaviors are reflective of personal growth and progress. Treatment to continue.   Plan: Return again in 4 weeks.  Diagnosis: Axis I: Generalized Anxiety Disorder    Axis II: No diagnosis    [place], LCSW 10/04/2020

## 2020-10-06 ENCOUNTER — Other Ambulatory Visit: Payer: Self-pay

## 2020-10-06 ENCOUNTER — Ambulatory Visit (INDEPENDENT_AMBULATORY_CARE_PROVIDER_SITE_OTHER): Payer: Managed Care, Other (non HMO)

## 2020-10-06 DIAGNOSIS — E538 Deficiency of other specified B group vitamins: Secondary | ICD-10-CM | POA: Diagnosis not present

## 2020-10-06 DIAGNOSIS — Z30011 Encounter for initial prescription of contraceptive pills: Secondary | ICD-10-CM

## 2020-10-06 MED ORDER — CYANOCOBALAMIN 1000 MCG/ML IJ SOLN
1000.0000 ug | Freq: Once | INTRAMUSCULAR | Status: AC
  Administered 2020-10-06: 1000 ug via INTRAMUSCULAR

## 2020-10-06 MED ORDER — DROSPIRENONE-ETHINYL ESTRADIOL 3-0.02 MG PO TABS: 1.0000 | ORAL_TABLET | Freq: Every day | ORAL | 3 refills | Status: DC

## 2020-10-12 ENCOUNTER — Encounter: Payer: Self-pay | Admitting: Dermatology

## 2020-10-26 ENCOUNTER — Encounter: Payer: Self-pay | Admitting: Internal Medicine

## 2020-11-05 ENCOUNTER — Other Ambulatory Visit: Payer: Self-pay

## 2020-11-05 ENCOUNTER — Ambulatory Visit
Admission: EM | Admit: 2020-11-05 | Discharge: 2020-11-05 | Disposition: A | Discharge status: Home / Self Care | Payer: Managed Care, Other (non HMO) | Attending: Emergency Medicine | Admitting: Emergency Medicine

## 2020-11-05 ENCOUNTER — Encounter: Payer: Self-pay | Admitting: Emergency Medicine

## 2020-11-05 ENCOUNTER — Ambulatory Visit (INDEPENDENT_AMBULATORY_CARE_PROVIDER_SITE_OTHER): Payer: Managed Care, Other (non HMO)

## 2020-11-05 DIAGNOSIS — R42 Dizziness and giddiness: Secondary | ICD-10-CM

## 2020-11-05 DIAGNOSIS — R110 Nausea: Secondary | ICD-10-CM

## 2020-11-05 DIAGNOSIS — E538 Deficiency of other specified B group vitamins: Secondary | ICD-10-CM

## 2020-11-05 MED ORDER — CYANOCOBALAMIN 1000 MCG/ML IJ SOLN
1000.0000 ug | Freq: Once | INTRAMUSCULAR | Status: AC
  Administered 2020-11-05: 1000 ug via INTRAMUSCULAR

## 2020-11-05 MED ORDER — ONDANSETRON 4 MG PO TBDP: 4.0000 mg | ORAL_TABLET | Freq: Three times a day (TID) | ORAL | 0 refills | Status: DC | PRN

## 2020-11-05 NOTE — ED Provider Notes (Signed)
Ebony Lawson    CSN: [id number] Arrival date & time: 11/05/20  1532      History   Chief Complaint Chief Complaint  Patient presents with  . Nausea  . Dizziness    HPI Gerianne Lawson is a 35 y.o. female.   Patient presents with nausea and dizziness for 2 days.  No vomiting.  She had diarrhea at the onset of her symptoms 2 days ago but this has resolved.  She had a COVID 2 weeks ago. She denies fever, chills, cough, shortness of breath, vomiting, or other symptoms.  Treatment attempted at home with Tylenol PM.  LMP 10/27/2020.  Her medical history includes migraine headaches, anxiety disorder, alopecia, chronic pain of knees, insomnia, fatigue.  The history is provided by the patient and medical records.    Past Medical History:  Diagnosis Date  . Heart murmur   . Migraine     Patient Active Problem List   Diagnosis Date Noted  . Encounter for general adult medical examination with abnormal findings 06/09/2020  . Absolute anemia 06/09/2020  . Screening examination for sexually transmitted disease 06/09/2020  . Cardiac arrhythmia 11/12/2019  . Other fatigue 11/12/2019  . GAD (generalized anxiety disorder) 11/28/2018  . 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 Alopecia 03/21/2018  . Chronic pain of both knees 03/21/2018  . Screening for malignant neoplasm of cervix 03/21/2018  . Dysuria 03/21/2018  . Cervical low risk human papillomavirus (HPV) DNA test positive 09/07/2017  . Generalized anxiety disorder 09/07/2017  . Routine cervical smear 12/09/2013  . BCP (birth control pills) initiation 11/18/2013    History reviewed. No pertinent surgical history.  OB History   No obstetric history on file.      Home Medications    Prior to Admission medications   Medication Sig Start Date End Date Taking? Authorizing Provider  Adapalene (DIFFERIN) 0.3 % gel Apply pea size amount to entire face once every night 02/03/20  Yes Brendolyn Patty, MD  Calcium  Carbonate-Vitamin D (CALCIUM 600+D3 PO) Take 1 tablet by mouth daily.   Yes [provider]  doxycycline (MONODOX) 100 MG capsule Take 1 capsule (100 mg total) by mouth 2 (two) times daily. 09/09/20  Yes Moye, [place], MD  drospirenone-ethinyl estradiol (YAZ) 3-0.02 MG tablet Take 1 tablet by mouth daily. 10/06/20  Yes Lavera Guise, MD  Fe Cbn-Fe Gluc-FA-B12-C-DSS (FERRALET 90) 90-1 MG TABS Take 1 tablet by mouth daily. 05/10/20  Yes Ronnell Freshwater, NP  Minocycline HCl Micronized (AMZEEQ) 4 % FOAM Apply thin coat to face once a day as directed 09/09/20  Yes Moye, [place], MD  ondansetron (ZOFRAN ODT) 4 MG disintegrating tablet Take 1 tablet (4 mg total) by mouth every 8 (eight) hours as needed for nausea or vomiting. 11/05/20  Yes Sharion Balloon, NP  doxycycline (MONODOX) 100 MG capsule TAKE 1 CAPSULE(100 MG) BY MOUTH DAILY 08/10/20   Moye, [place], MD  hydrOXYzine (ATARAX/VISTARIL) 10 MG tablet Take 1-2 tablets (10-20 mg total) by mouth 2 (two) times daily as needed. For severe anxiety 11/06/19   Ronnell Freshwater, NP  spironolactone (ALDACTONE) 100 MG tablet Take 1 tablet (100 mg total) by mouth daily. 02/03/20   Brendolyn Patty, MD    Family History Family History  Problem Relation Age of Onset  . Hyperlipidemia Mother   . Cancer Father        prostate and skin cancer  . Hyperlipidemia Father   . Heart disease Father  CAD - triple bypass 2014  . Hypertension Father   . Hearing loss Paternal Grandfather   . Hypertension Paternal Grandfather   . Anxiety disorder Brother     Social History Social History   Tobacco Use  . Smoking status: Never Smoker  . Smokeless tobacco: Never Used  Vaping Use  . Vaping Use: Never used  Substance Use Topics  . Alcohol use: Yes    Alcohol/week: 0.0 standard drinks    Comment: 2-3 drinks monthly  . Drug use: No     Allergies   Patient has no known allergies.   Review of Systems Review of Systems  Constitutional: Negative for chills  and fever.  Respiratory: Negative for cough and shortness of breath.   Cardiovascular: Negative for chest pain and palpitations.  Gastrointestinal: Positive for diarrhea and nausea. Negative for abdominal pain and vomiting.  Genitourinary: Negative for dysuria and hematuria.  Skin: Negative for color change and rash.  Neurological: Positive for dizziness. Negative for seizures and syncope.  All other systems reviewed and are negative.    Physical Exam Triage Vital Signs ED Triage Vitals  Enc Vitals Group     BP      Pulse      Resp      Temp      Temp src      SpO2      Weight      Height      Head Circumference      Peak Flow      Pain Score      Pain Loc      Pain Edu?      Excl. in [place]?    Orthostatic VS for the past 24 hrs:  BP- Lying Pulse- Lying BP- Sitting Pulse- Sitting BP- Standing at 0 minutes Pulse- Standing at 0 minutes  11/05/20 1551 114/76 99 110/74 100 112/81 104    Updated Vital Signs BP 112/81 (BP Location: Right Arm)   Pulse (!) 104   Temp 99.1 F (37.3 C) (Oral)   Resp 15   LMP 10/27/2020   SpO2 98%   Visual Acuity Right Eye Distance:   Left Eye Distance:   Bilateral Distance:    Right Eye Near:   Left Eye Near:    Bilateral Near:     Physical Exam Vitals and nursing note reviewed.  Constitutional:      General: She is not in acute distress.    Appearance: She is well-developed. She is not ill-appearing.  HENT:     Head: Normocephalic and atraumatic.     Right Ear: Tympanic membrane normal.     Left Ear: Tympanic membrane normal.     Nose: Nose normal.     Mouth/Throat:     Mouth: Mucous membranes are moist.     Pharynx: Oropharynx is clear.  Eyes:     Conjunctiva/sclera: Conjunctivae normal.  Cardiovascular:     Rate and Rhythm: Normal rate and regular rhythm.     Heart sounds: Normal heart sounds.  Pulmonary:     Effort: Pulmonary effort is normal. No respiratory distress.     Breath sounds: Normal breath sounds.   Abdominal:     General: Bowel sounds are normal. There is no distension.     Palpations: Abdomen is soft.     Tenderness: There is no abdominal tenderness. There is no right CVA tenderness, left CVA tenderness, guarding or rebound.  Musculoskeletal:     Cervical back: Neck supple.  Skin:  General: Skin is warm and dry.  Neurological:     General: No focal deficit present.     Mental Status: She is alert and oriented to person, place, and time.     Sensory: No sensory deficit.     Motor: No weakness.     Gait: Gait normal.  Psychiatric:        Mood and Affect: Mood normal.        Behavior: Behavior normal.      UC Treatments / Results  Labs (all labs ordered are listed, but only abnormal results are displayed) Labs Reviewed - No data to display  EKG   Radiology No results found.  Procedures Procedures (including critical care time)  Medications Ordered in UC Medications - No data to display  Initial Impression / Assessment and Plan / UC Course  I have reviewed the triage vital signs and the nursing notes.  Pertinent labs & imaging results that were available during my care of the patient were reviewed by me and considered in my medical decision making (see chart for details).   Nausea, dizziness.  Patient is well-appearing and her exam is reassuring.  She declines transfer to the ED at this time.  Treating nausea with Zofran.  Instructed patient to keep her self hydrated with clear liquids.  Strict ED precautions discussed at length.  Patient agrees to plan of care.   Final Clinical Impressions(s) / UC Diagnoses   Final diagnoses:  Nausea without vomiting  Dizziness     Discharge Instructions     Take the antinausea medication as directed.    Keep yourself hydrated with clear liquids, such as water, Gatorade, Pedialyte, Sprite, or ginger ale.    Go to the emergency department if you have acute worsening symptoms.        ED Prescriptions     Medication Sig Dispense Auth. Provider   ondansetron (ZOFRAN ODT) 4 MG disintegrating tablet Take 1 tablet (4 mg total) by mouth every 8 (eight) hours as needed for nausea or vomiting. 20 tablet Sharion Balloon, NP     PDMP not reviewed this encounter.   Sharion Balloon, NP 11/05/20 [id number]

## 2020-11-05 NOTE — ED Triage Notes (Signed)
Patient c/o nausea and dizziness x 2 days.   Patient endorses onset of symptoms began with diarrhea and nausea. Patient endorses diarrhea has subsided.   Patient endorses dizziness is worsened when moving.   Patient denies LOC.   Patient endorses having COVID-19 2 weeks prior to this visit.   LMP: 10/27/2020. Patient is taking oral contraceptives.   Patient took Tylenol-PM for sleep last night.

## 2020-11-05 NOTE — Discharge Instructions (Signed)
Take the antinausea medication as directed.    Keep yourself hydrated with clear liquids, such as water, Gatorade, Pedialyte, Sprite, or ginger ale.    Go to the emergency department if you have acute worsening symptoms.

## 2020-11-09 ENCOUNTER — Other Ambulatory Visit: Payer: Self-pay

## 2020-11-09 ENCOUNTER — Encounter: Payer: Self-pay | Admitting: Nurse Practitioner

## 2020-11-09 ENCOUNTER — Ambulatory Visit (INDEPENDENT_AMBULATORY_CARE_PROVIDER_SITE_OTHER): Payer: Managed Care, Other (non HMO) | Admitting: Nurse Practitioner

## 2020-11-09 DIAGNOSIS — R5383 Other fatigue: Secondary | ICD-10-CM

## 2020-11-09 DIAGNOSIS — R42 Dizziness and giddiness: Secondary | ICD-10-CM

## 2020-11-09 MED ORDER — MECLIZINE HCL 12.5 MG PO TABS: 12.5000 mg | ORAL_TABLET | Freq: Three times a day (TID) | ORAL | 0 refills | Status: DC | PRN

## 2020-11-09 NOTE — Progress Notes (Signed)
  [facility] [place], [place] [address]  Internal MEDICINE  Office Visit Note  Patient Name: Ebony Lawson  [id number]  [id number]  Date of Service: 10/04/800  Duplicate note

## 2020-11-09 NOTE — Progress Notes (Signed)
[facility] [place], [place] [address]  Internal MEDICINE  Office Visit Note  Patient Name: Ebony Lawson  [id number]  [id number]  Date of Service: 11/12/2020  Chief Complaint  Patient presents with   Acute Visit    Still having covid symptoms, patient went to urgent care on Friday for the nausea , patient has also had dizziness and fatigue, still have a cough, feet and hands are very cold and it takes a while to get them warm.     Cough     HPI Ebony Lawson presents for an acute sick visit she recently contracted COVID-98.  Status post COVID-19 and she is still experiencing some COVID symptoms.  This past Friday she went to urgent care due to persistent nausea, dizziness, and fatigue.  She reports a lingering cough, cold hands and feet and it takes a while for her hands and feet to get warm.  She denies any vertigo.  She reports checking her blood pressure at home sitting and standing and noticing a drop in her blood pressure when she stood up and checked it.  She also reports still having some loose stools.     Current Medication:  Outpatient Encounter Medications as of 11/09/2020  Medication Sig   Adapalene (DIFFERIN) 0.3 % gel Apply pea size amount to entire face once every night   Calcium Carbonate-Vitamin D (CALCIUM 600+D3 PO) Take 1 tablet by mouth daily.   doxycycline (MONODOX) 100 MG capsule TAKE 1 CAPSULE(100 MG) BY MOUTH DAILY   doxycycline (MONODOX) 100 MG capsule Take 1 capsule (100 mg total) by mouth 2 (two) times daily.   drospirenone-ethinyl estradiol (YAZ) 3-0.02 MG tablet Take 1 tablet by mouth daily.   Fe Cbn-Fe Gluc-FA-B12-C-DSS (FERRALET 90) 90-1 MG TABS Take 1 tablet by mouth daily.   hydrOXYzine (ATARAX/VISTARIL) 10 MG tablet Take 1-2 tablets (10-20 mg total) by mouth 2 (two) times daily as needed. For severe anxiety   meclizine (ANTIVERT) 12.5 MG tablet Take 1 tablet (12.5 mg total) by mouth 3 (three) times daily as needed for dizziness or  nausea.   Minocycline HCl Micronized (AMZEEQ) 4 % FOAM Apply thin coat to face once a day as directed   ondansetron (ZOFRAN ODT) 4 MG disintegrating tablet Take 1 tablet (4 mg total) by mouth every 8 (eight) hours as needed for nausea or vomiting.   spironolactone (ALDACTONE) 100 MG tablet Take 1 tablet (100 mg total) by mouth daily.   No facility-administered encounter medications on file as of 11/09/2020.      Medical History: Past Medical History:  Diagnosis Date   Heart murmur    Migraine      Vital Signs: BP 108/80   Pulse 80   Temp 98.3 F (36.8 C)   Resp 16   Ht 5' (1.524 m)   Wt 106 lb 12.8 oz (48.4 kg)   LMP 10/27/2020   SpO2 97%   BMI 20.86 kg/m    Review of Systems  Constitutional:  Positive for fatigue. Negative for appetite change, chills, fever and unexpected weight change.  HENT:  Negative for congestion, postnasal drip, rhinorrhea, sinus pressure, sinus pain, sneezing, sore throat and trouble swallowing.   Eyes:  Negative for redness.  Respiratory:  Positive for cough. Negative for chest tightness and shortness of breath.   Cardiovascular:  Negative for chest pain and palpitations.  Gastrointestinal:  Positive for diarrhea (loose) and nausea. Negative for abdominal pain, constipation and vomiting.  Genitourinary:  Negative for dysuria and  frequency.  Musculoskeletal: Negative.  Negative for arthralgias, back pain, joint swelling and neck pain.  Skin: Negative.  Negative for rash.  Neurological:  Positive for dizziness, light-headedness and headaches. Negative for tremors and numbness.  Hematological:  Negative for adenopathy. Does not bruise/bleed easily.  Psychiatric/Behavioral: Negative.  Negative for behavioral problems (Depression), sleep disturbance and suicidal ideas. The patient is not nervous/anxious.    Physical Exam Vitals reviewed.  Constitutional:      General: She is not in acute distress.    Appearance: Normal appearance. She is  well-developed and normal weight. She is not ill-appearing or diaphoretic.  HENT:     Head: Normocephalic and atraumatic.  Neck:     Thyroid: No thyromegaly.     Vascular: No JVD.     Trachea: No tracheal deviation.  Cardiovascular:     Rate and Rhythm: Normal rate and regular rhythm.     Pulses: Normal pulses.     Heart sounds: Normal heart sounds. No murmur heard.   No friction rub. No gallop.  Pulmonary:     Effort: Pulmonary effort is normal. No respiratory distress.     Breath sounds: Normal breath sounds. No wheezing or rales.  Chest:     Chest wall: No tenderness.  Abdominal:     General: Bowel sounds are normal. There is no distension.     Palpations: Abdomen is soft. There is no mass.     Tenderness: There is no abdominal tenderness. There is no guarding or rebound.     Hernia: No hernia is present.  Musculoskeletal:        General: Normal range of motion.  Skin:    General: Skin is warm and dry.     Capillary Refill: Capillary refill takes less than 2 seconds.  Neurological:     Mental Status: She is alert and oriented to person, place, and time.     Cranial Nerves: No cranial nerve deficit.  Psychiatric:        Mood and Affect: Mood normal.        Behavior: Behavior normal.        Thought Content: Thought content normal.        Judgment: Judgment normal.      Assessment/Plan: 1. Dizziness Patient reports dizziness with no vertigo.  She is status post COVID-19 with some lingering symptoms.  Labs ordered to rule out other causes of dizziness.  Meclizine 12.5 mg 3 times daily as needed for dizziness or nausea prescribed to the patient and sent to her pharmacy.  We will follow-up with patient in 2 weeks to discuss lab results and reevaluate dizziness and nausea.   - CBC with Differential/Platelet - CMP14+EGFR - TSH + free T4 - meclizine (ANTIVERT) 12.5 MG tablet; Take 1 tablet (12.5 mg total) by mouth 3 (three) times daily as needed for dizziness or nausea.   Dispense: 90 tablet; Refill: 0  2. Other fatigue Patient also complains of lingering fatigue status post COVID-19 infection.  Labs ordered we will also be evaluated to rule out other causes of fatigue - CBC with Differential/Platelet - CMP14+EGFR - TSH + free T4   General Counseling: Ebony Lawson verbalizes understanding of the findings of todays visit and agrees with plan of treatment. I have discussed any further diagnostic evaluation that may be needed or ordered today. We also reviewed her medications today. she has been encouraged to call the office with any questions or concerns that should arise related to todays visit.  Counseling:    Orders Placed This Encounter  Procedures   CBC with Differential/Platelet   CMP14+EGFR   TSH + free T4    Meds ordered this encounter  Medications   meclizine (ANTIVERT) 12.5 MG tablet    Sig: Take 1 tablet (12.5 mg total) by mouth 3 (three) times daily as needed for dizziness or nausea.    Dispense:  90 tablet    Refill:  0    [place] Controlled Substance Database was reviewed by me.  Return in about 4 weeks (around 12/07/2020) for F/U dizziness and nausea , Ebony Lawson PCP.   Time spent:30 Minutes This patient was seen by Jonetta Osgood, FNP-C in collaboration with Dr. Clayborn Bigness as a part of collaborative care agreement.  Linday Rhodes R. Valetta Fuller, MSN, FNP-C Internal Medicine

## 2020-11-10 ENCOUNTER — Encounter: Payer: Self-pay | Admitting: Internal Medicine

## 2020-11-11 ENCOUNTER — Other Ambulatory Visit: Payer: Self-pay

## 2020-11-11 ENCOUNTER — Ambulatory Visit (INDEPENDENT_AMBULATORY_CARE_PROVIDER_SITE_OTHER): Payer: 59 | Admitting: Licensed Clinical Social Worker

## 2020-11-11 DIAGNOSIS — F411 Generalized anxiety disorder: Secondary | ICD-10-CM

## 2020-11-11 LAB — CMP14+EGFR
ALT: 21 IU/L (ref 0–32)
AST: 16 IU/L (ref 0–40)
Albumin/Globulin Ratio: 2 (ref 1.2–2.2)
Albumin: 4.6 g/dL (ref 3.8–4.8)
Alkaline Phosphatase: 45 IU/L (ref 44–121)
BUN/Creatinine Ratio: 9 (ref 9–23)
BUN: 7 mg/dL (ref 6–20)
Bilirubin Total: 0.4 mg/dL (ref 0.0–1.2)
CO2: 22 mmol/L (ref 20–29)
Calcium: 9.8 mg/dL (ref 8.7–10.2)
Chloride: 103 mmol/L (ref 96–106)
Creatinine, Ser: 0.77 mg/dL (ref 0.57–1.00)
Globulin, Total: 2.3 g/dL (ref 1.5–4.5)
Glucose: 101 mg/dL — ABNORMAL HIGH (ref 65–99)
Potassium: 4.5 mmol/L (ref 3.5–5.2)
Sodium: 139 mmol/L (ref 134–144)
Total Protein: 6.9 g/dL (ref 6.0–8.5)
eGFR: 105 mL/min/{1.73_m2} (ref >59)

## 2020-11-11 LAB — CBC WITH DIFFERENTIAL/PLATELET
Basophils Absolute: 0 10*3/uL (ref 0.0–0.2)
Basos: 0 %
EOS (ABSOLUTE): 0 10*3/uL (ref 0.0–0.4)
Eos: 1 %
Hematocrit: 39.1 % (ref 34.0–46.6)
Hemoglobin: 13.1 g/dL (ref 11.1–15.9)
Immature Grans (Abs): 0 10*3/uL (ref 0.0–0.1)
Immature Granulocytes: 0 %
Lymphocytes Absolute: 1.5 10*3/uL (ref 0.7–3.1)
Lymphs: 29 %
MCH: 27.9 pg (ref 26.6–33.0)
MCHC: 33.5 g/dL (ref 31.5–35.7)
MCV: 83 fL (ref 79–97)
Monocytes Absolute: 0.4 10*3/uL (ref 0.1–0.9)
Monocytes: 7 %
Neutrophils Absolute: 3.4 10*3/uL (ref 1.4–7.0)
Neutrophils: 63 %
Platelets: 185 10*3/uL (ref 150–450)
RBC: 4.7 x10E6/uL (ref 3.77–5.28)
RDW: 13.1 % (ref 11.7–15.4)
WBC: 5.4 10*3/uL (ref 3.4–10.8)

## 2020-11-11 LAB — TSH+FREE T4
Free T4: 1.45 ng/dL (ref 0.82–1.77)
TSH: 0.888 u[IU]/mL (ref 0.450–4.500)

## 2020-11-11 NOTE — Progress Notes (Signed)
Virtual Visit via Video Note  I connected with Ebony Lawson on 11/11/20 at  9:00 AM EDT by a video enabled telemedicine application and verified that I am speaking with the correct person using two identifiers.  Location: Patient: home Provider: remote office [place], [place])   I discussed the limitations of evaluation and management by telemedicine and the availability of in person appointments. The patient expressed understanding and agreed to proceed.   I discussed the assessment and treatment plan with the patient. The patient was provided an opportunity to ask questions and all were answered. The patient agreed with the plan and demonstrated an understanding of the instructions.   The patient was advised to call back or seek an in-person evaluation if the symptoms worsen or if the condition fails to improve as anticipated.  I provided 45 minutes of non-face-to-face time during this encounter.   Frederica Chrestman R Tilak Oakley, LCSW   THERAPIST PROGRESS NOTE  Session Time: 9-9:45a  Participation Level: Active  Behavioral Response: Neat and Well GroomedAlertAnxious  Type of Therapy: Individual Therapy  Treatment Goals addressed: Anxiety and Coping  Interventions: CBT and DBT  Summary: Ebony Lawson is a 35 y.o. female who presents with symptoms consistent with anxiety.  Pt reports that overall mood is stable and that she feels her anxiety and stress levels have increased recently due to having covid. Pt reports that she was sick for two weeks and is still having some nausea and dizziness at times. Pt is reaching out to PCP on a regular basis with any concerns she has about her health. Pt does feel that she may be preoccupied about her health and overall symptoms since her illness. Discussed personal limits that pt can set for herself re: Googling symptoms that she may be having. Pt admits that googling symptoms can make her feel both better and trigger more fear.  Discussed feeling  uncertain and how uncertainty triggers distress. Discussed distress tolerance and how this needs to be practiced often.  Continued recommendations are as follows: self care behaviors, positive social engagements, focusing on overall work/home/life balance, and focusing on positive physical and emotional wellness.   Suicidal/Homicidal: No  Therapist Response:  Ebony Lawson is continuing to develop behavioral and cognitive strategies to reduce or eliminate the irrational anxiety. Ebony Lawson is intentionally implementing appropriate relaxation and diversion activities to decrease overall levels of anxiety. Ebony Lawson is discussing in therapy a list of key conflicts that are triggering fear and worry. Ebony Lawson talks through these and brainstorms through potential solutions with therapist. These behaviors are reflective of personal growth and progress. Treatment to continue.   Plan: Return again in 4 weeks.  Diagnosis: Axis I: Generalized Anxiety Disorder    Axis II: No diagnosis    [place], LCSW 11/11/2020

## 2020-11-17 ENCOUNTER — Other Ambulatory Visit: Payer: Self-pay | Admitting: Nurse Practitioner

## 2020-11-17 DIAGNOSIS — R110 Nausea: Secondary | ICD-10-CM

## 2020-11-17 MED ORDER — ONDANSETRON 4 MG PO TBDP: 4.0000 mg | ORAL_TABLET | Freq: Three times a day (TID) | ORAL | 0 refills | Status: DC | PRN

## 2020-11-26 ENCOUNTER — Other Ambulatory Visit: Payer: Self-pay | Admitting: Nurse Practitioner

## 2020-11-26 DIAGNOSIS — R110 Nausea: Secondary | ICD-10-CM

## 2020-11-30 NOTE — Telephone Encounter (Signed)
Please review and fill if appropriate

## 2020-12-07 ENCOUNTER — Other Ambulatory Visit: Payer: Self-pay

## 2020-12-07 ENCOUNTER — Ambulatory Visit (INDEPENDENT_AMBULATORY_CARE_PROVIDER_SITE_OTHER): Payer: Managed Care, Other (non HMO)

## 2020-12-07 DIAGNOSIS — E538 Deficiency of other specified B group vitamins: Secondary | ICD-10-CM

## 2020-12-07 MED ORDER — CYANOCOBALAMIN 1000 MCG/ML IJ SOLN
1000.0000 ug | Freq: Once | INTRAMUSCULAR | Status: AC
  Administered 2020-12-07: 1000 ug via INTRAMUSCULAR

## 2020-12-13 ENCOUNTER — Telehealth: Payer: Self-pay

## 2020-12-13 NOTE — Telephone Encounter (Signed)
Left vm to screen for 12/14/20 appointment-[name] 

## 2020-12-14 ENCOUNTER — Other Ambulatory Visit: Payer: Self-pay

## 2020-12-14 ENCOUNTER — Ambulatory Visit: Payer: Managed Care, Other (non HMO) | Admitting: Nurse Practitioner

## 2020-12-14 ENCOUNTER — Encounter: Payer: Self-pay | Admitting: Nurse Practitioner

## 2020-12-14 VITALS — BP 106/84 | HR 81 | Temp 98.4°F | Resp 16 | Ht 60.0 in | Wt 107.6 lb

## 2020-12-14 DIAGNOSIS — Z30011 Encounter for initial prescription of contraceptive pills: Secondary | ICD-10-CM | POA: Diagnosis not present

## 2020-12-14 DIAGNOSIS — R42 Dizziness and giddiness: Secondary | ICD-10-CM

## 2020-12-14 MED ORDER — MECLIZINE HCL 12.5 MG PO TABS: 12.5000 mg | ORAL_TABLET | Freq: Three times a day (TID) | ORAL | 1 refills | Status: DC | PRN

## 2020-12-14 MED ORDER — DROSPIRENONE-ETHINYL ESTRADIOL 3-0.02 MG PO TABS: 1.0000 | ORAL_TABLET | Freq: Every day | ORAL | 3 refills | Status: DC

## 2020-12-14 NOTE — Progress Notes (Signed)
[facility] [place], [place] [address]  Internal MEDICINE  Office Visit Note  Patient Name: Ebony Lawson  [id number]  [id number]  Date of Service: 12/14/2020  Chief Complaint  Patient presents with   Follow-up    Dizziness and nausea   Quality Metric Gaps    Tdap vaccine    HPI Ebony Lawson presents for a follow up visit regarding nausea and dizziness. Ebony Lawson has figured out that these episodes of dizziness and vertigo seem to be visually triggered by seeing different things move such as the credits on the movie theater screen. By avoiding things that she knows triggers the dizziness and vertigo, she has had less frequent episodes. She also uses the meclizine as needed which has also helped. Her CBC was wnl, no anemia, Vitamin D and Vitamin B12 levels were normal in April and Cmp was wnl as well. TSH and free T4 was also wnl.  Her lipid panel in April was wnl. To continue keeping her lipid panel wnl, adding OTC fish oil supplementation was discussed as well as increasing lean protein and decreasing protein that is high in saturated fats. Due for CPE in December 2022. Repeat pap in December 2022.    Current Medication: Outpatient Encounter Medications as of 12/14/2020  Medication Sig   Adapalene (DIFFERIN) 0.3 % gel Apply pea size amount to entire face once every night   Calcium Carbonate-Vitamin D (CALCIUM 600+D3 PO) Take 1 tablet by mouth daily.   doxycycline (MONODOX) 100 MG capsule Take 1 capsule (100 mg total) by mouth 2 (two) times daily.   Fe Cbn-Fe Gluc-FA-B12-C-DSS (FERRALET 90) 90-1 MG TABS Take 1 tablet by mouth daily.   hydrOXYzine (ATARAX/VISTARIL) 10 MG tablet Take 1-2 tablets (10-20 mg total) by mouth 2 (two) times daily as needed. For severe anxiety   Minocycline HCl Micronized (AMZEEQ) 4 % FOAM Apply thin coat to face once a day as directed   ondansetron (ZOFRAN-ODT) 4 MG disintegrating tablet DISSOLVE 1 TABLET(4 MG) ON THE TONGUE EVERY 8 HOURS AS  NEEDED FOR NAUSEA OR VOMITING   spironolactone (ALDACTONE) 100 MG tablet Take 1 tablet (100 mg total) by mouth daily.   [DISCONTINUED] drospirenone-ethinyl estradiol (YAZ) 3-0.02 MG tablet Take 1 tablet by mouth daily.   [DISCONTINUED] meclizine (ANTIVERT) 12.5 MG tablet Take 1 tablet (12.5 mg total) by mouth 3 (three) times daily as needed for dizziness or nausea.   doxycycline (MONODOX) 100 MG capsule TAKE 1 CAPSULE(100 MG) BY MOUTH DAILY (Patient not taking: Reported on 12/14/2020)   drospirenone-ethinyl estradiol (YAZ) 3-0.02 MG tablet Take 1 tablet by mouth daily.   meclizine (ANTIVERT) 12.5 MG tablet Take 1 tablet (12.5 mg total) by mouth 3 (three) times daily as needed for dizziness or nausea.   No facility-administered encounter medications on file as of 12/14/2020.    Surgical History: History reviewed. No pertinent surgical history.  Medical History: Past Medical History:  Diagnosis Date   Heart murmur    Migraine     Family History: Family History  Problem Relation Age of Onset   Hyperlipidemia Mother    Cancer Father        prostate and skin cancer   Hyperlipidemia Father    Heart disease Father        CAD - triple bypass 2014   Hypertension Father    Hearing loss Paternal Grandfather    Hypertension Paternal Grandfather    Anxiety disorder Brother     Social History   Socioeconomic History  Marital status: Single    Spouse name: Not on file   Number of children: 0   Years of education: 16   Highest education level: Bachelor's degree (e.g., BA, AB, BS)  Occupational History   Occupation: DNA Animal nutritionist: [employer]  Tobacco Use   Smoking status: Never   Smokeless tobacco: Never  Vaping Use   Vaping Use: Never used  Substance and Sexual Activity   Alcohol use: Yes    Alcohol/week: 0.0 standard drinks    Comment: 2-3 drinks monthly   Drug use: No   Sexual activity: Yes    Partners: Male    Birth control/protection: OCP    Comment:  1 partner   Other Topics Concern   Not on file  Social History Narrative   Ebony Lawson grew up outside of [place]. She attended 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  HENT:  Negative for congestion, rhinorrhea, sneezing and sore throat.   Eyes:  Negative for redness.  Respiratory:  Negative for cough, chest tightness and shortness of breath.   Cardiovascular:  Negative for chest pain and palpitations.  Gastrointestinal:  Negative for abdominal pain, constipation, diarrhea, nausea and vomiting.  Genitourinary:  Negative for dysuria and frequency.  Musculoskeletal:  Negative for arthralgias, back pain, joint swelling and neck pain.  Skin:  Negative for rash.  Neurological: Negative.  Negative for tremors and numbness.  Hematological:  Negative for adenopathy. Does not bruise/bleed easily.  Psychiatric/Behavioral:  Negative for behavioral problems (Depression), sleep disturbance and suicidal ideas. The patient is not nervous/anxious.    Vital Signs: BP 106/84   Pulse 81   Temp 98.4 F (36.9 C)   Resp 16   Ht 5' (1.524 m)   Wt 107 lb 9.6 oz (48.8 kg)   SpO2 94%   BMI 21.01 kg/m    Physical Exam Constitutional:      General: She is not in acute distress.    Appearance: Normal appearance. She is well-developed and normal weight. She is not ill-appearing or diaphoretic.  HENT:     Head: Normocephalic and atraumatic.  Neck:     Thyroid: No thyromegaly.     Vascular: No  JVD.     Trachea: No tracheal deviation.  Cardiovascular:     Rate and Rhythm: Normal rate and regular rhythm.     Pulses: Normal pulses.     Heart sounds: Normal heart sounds. No murmur heard.   No friction rub. No gallop.  Pulmonary:     Effort: Pulmonary effort is normal. No respiratory distress.     Breath sounds: Normal breath sounds. No wheezing or rales.  Chest:     Chest wall: No tenderness.  Skin:    General: Skin is warm and dry.     Capillary Refill: Capillary refill takes less than 2 seconds.  Neurological:  Mental Status: She is alert and oriented to person, place, and time.  Psychiatric:        Mood and Affect: Mood normal.        Behavior: Behavior normal.     Assessment/Plan: 1. Vertigo CT head ordered to rule out intracranial abnormalities.  - CT Head Wo Contrast; Future  2. Dizziness Improving, meclizine prescription reordered.  - CT Head Wo Contrast; Future - meclizine (ANTIVERT) 12.5 MG tablet; Take 1 tablet (12.5 mg total) by mouth 3 (three) times daily as needed for dizziness or nausea.  Dispense: 90 tablet; Refill: 1  3. Encounter for prescription of oral contraceptives Yaz reordered.  - drospirenone-ethinyl estradiol (YAZ) 3-0.02 MG tablet; Take 1 tablet by mouth daily.  Dispense: 90 tablet; Refill: 3   General Counseling: Ebony Lawson verbalizes understanding of the findings of todays visit and agrees with plan of treatment. I have discussed any further diagnostic evaluation that may be needed or ordered today. We also reviewed her medications today. she has been encouraged to call the office with any questions or concerns that should arise related to todays visit.    Orders Placed This Encounter  Procedures   CT Head Wo Contrast    Meds ordered this encounter  Medications   meclizine (ANTIVERT) 12.5 MG tablet    Sig: Take 1 tablet (12.5 mg total) by mouth 3 (three) times daily as needed for dizziness or nausea.    Dispense:  90 tablet     Refill:  1   drospirenone-ethinyl estradiol (YAZ) 3-0.02 MG tablet    Sig: Take 1 tablet by mouth daily.    Dispense:  90 tablet    Refill:  3    Return in about 5 months (around 05/16/2021) for CPE/PAP, Domnick Chervenak PCP.   Total time spent:30 Minutes Time spent includes review of chart, medications, test results, and follow up plan with the patient.   [place] Controlled Substance Database was reviewed by me.  This patient was seen by Jonetta Osgood, FNP-C in collaboration with Dr. Clayborn Bigness as a part of collaborative care agreement.   Mykenzie Ebanks R. Valetta Fuller, MSN, FNP-C Internal medicine

## 2020-12-22 ENCOUNTER — Other Ambulatory Visit: Payer: Self-pay

## 2020-12-22 ENCOUNTER — Ambulatory Visit (INDEPENDENT_AMBULATORY_CARE_PROVIDER_SITE_OTHER): Payer: 59 | Admitting: Licensed Clinical Social Worker

## 2020-12-22 DIAGNOSIS — F411 Generalized anxiety disorder: Secondary | ICD-10-CM | POA: Diagnosis not present

## 2020-12-22 NOTE — Progress Notes (Signed)
Virtual Visit via Video Note  I connected with Ebony Lawson on 12/22/20 at  4:00 PM EDT by a video enabled telemedicine application and verified that I am speaking with the correct person using two identifiers.  Location: Patient: home Provider: [facility]   I discussed the limitations of evaluation and management by telemedicine and the availability of in person appointments. The patient expressed understanding and agreed to proceed.  I discussed the assessment and treatment plan with the patient. The patient was provided an opportunity to ask questions and all were answered. The patient agreed with the plan and demonstrated an understanding of the instructions.   The patient was advised to call back or seek an in-person evaluation if the symptoms worsen or if the condition fails to improve as anticipated.  I provided 60 minutes of non-face-to-face time during this encounter.   Ebony Lawson R Ebony Kamel, LCSW   THERAPIST PROGRESS NOTE  Session Time: 4-5p  Participation Level: Active  Behavioral Response: Neat and Well GroomedAlertAnxious  Type of Therapy: Individual Therapy  Treatment Goals addressed: Anxiety  Interventions: CBT and Supportive  Summary: Ebony Lawson is a 35 y.o. female who presents with improving symptoms related to anxiety diagnosis. Patient reports overall mood is stable and that she is managing situational stressors well. Allow patient safe space to explore and express thoughts and feelings associated with recent external stressors and life events. Patient reports that she is currently contemplating a move to [place], which would benefit her boyfriend's family situation. Patient reports that she does currently live in [place], and works in [place], but does not have many friends ties or family ties to keep her in [place]. Allowed patient to verbally go through the pros and cons of staying in [place] versus moving to [place]. Encouraged patient to write  things out and use the logic and reason portion of her brain versus the emotional part of her brain when making large life decisions such as this. Patient agrees, and is planning to carefully weigh things out. Patient does report that she is happy in this relationship, and does not want the distance to be a barrier. Patient reports that everything is going well in her family life, and work life. Patient reports some work stress but she feels that she is managing it well. Continued recommendations are as follows: self care behaviors, positive social engagements, focusing on overall work/home/life balance, and focusing on positive physical and emotional wellness.  .   Suicidal/Homicidal: No  Therapist Response: Ebony Lawson is continuing to develop behavioral and cognitive strategies to reduce or eliminate the irrational anxiety. Ebony Lawson is intentionally implementing appropriate relaxation and diversion activities to decrease overall levels of anxiety. Ebony Lawson is discussing in therapy a list of key conflicts that are triggering fear and worry. Ebony Lawson talks through these and brainstorms through potential solutions with therapist. These behaviors are reflective of personal growth and progress. Treatment to continue.  Plan: Return again in 4 weeks.  Diagnosis: Axis I: Generalized Anxiety Disorder    Axis II: No diagnosis    Ebony Bo Ellissa Ayo, LCSW 12/22/2020

## 2020-12-25 ENCOUNTER — Other Ambulatory Visit: Payer: Self-pay | Admitting: Nurse Practitioner

## 2020-12-25 DIAGNOSIS — D508 Other iron deficiency anemias: Secondary | ICD-10-CM

## 2020-12-27 ENCOUNTER — Other Ambulatory Visit: Payer: Self-pay

## 2020-12-27 DIAGNOSIS — D508 Other iron deficiency anemias: Secondary | ICD-10-CM

## 2020-12-27 MED ORDER — FERRALET 90 90-1 MG PO TABS: 1.0000 | ORAL_TABLET | Freq: Every day | ORAL | 5 refills | Status: DC

## 2020-12-27 NOTE — Telephone Encounter (Signed)
Pt called need refills for iron medication send to [facility]ar and pt aware

## 2020-12-29 ENCOUNTER — Ambulatory Visit: Payer: Managed Care, Other (non HMO) | Admitting: Dermatology

## 2020-12-29 ENCOUNTER — Other Ambulatory Visit: Payer: Self-pay

## 2020-12-29 DIAGNOSIS — Z1283 Encounter for screening for malignant neoplasm of skin: Secondary | ICD-10-CM

## 2020-12-29 DIAGNOSIS — L700 Acne vulgaris: Secondary | ICD-10-CM | POA: Diagnosis not present

## 2020-12-29 DIAGNOSIS — L814 Other melanin hyperpigmentation: Secondary | ICD-10-CM | POA: Diagnosis not present

## 2020-12-29 DIAGNOSIS — D229 Melanocytic nevi, unspecified: Secondary | ICD-10-CM

## 2020-12-29 DIAGNOSIS — L7211 Pilar cyst: Secondary | ICD-10-CM

## 2020-12-29 DIAGNOSIS — L578 Other skin changes due to chronic exposure to nonionizing radiation: Secondary | ICD-10-CM

## 2020-12-29 DIAGNOSIS — D1800 Hemangioma unspecified site: Secondary | ICD-10-CM

## 2020-12-29 DIAGNOSIS — L821 Other seborrheic keratosis: Secondary | ICD-10-CM

## 2020-12-29 MED ORDER — AMZEEQ 4 % EX FOAM: CUTANEOUS | 6 refills | Status: DC

## 2020-12-29 MED ORDER — ADAPALENE 0.3 % EX GEL: CUTANEOUS | 6 refills | Status: DC

## 2020-12-29 NOTE — Progress Notes (Signed)
Follow-Up Visit   Subjective  Ebony Lawson is a 35 y.o. female who presents for the following: Acne (Patient here today for 3 month follow up on acne. She is taking adapalene gel, amqeez foam daily. spironolactone 100 mg once daily and doxycycline 100 by mouth once daily. She reports being sick in may and d/c doxycycline and spironolacton. She has started medications back but only takes doxycycline once daily. She feels acne is doing better than before. ) and Annual Exam (Patient here today for annual full body exam. Patient reports a few moles on back she noticed itch. Patient also reports a cyst at scalp she that itches sometimes. Patient reports hair that grows in area is course. Patient reports a few moles on arms but she has not noticed any spots of concern today. Patient has family history of skin cancer of melanoma in father. She has had a few places removed in past. ).  Patient here for full body skin exam and skin cancer screening.  The following portions of the chart were reviewed this encounter and updated as appropriate:  Tobacco  Allergies  Meds  Problems  Med Hx  Surg Hx  Fam Hx      Objective  Well appearing patient in no apparent distress; mood and affect are within normal limits.  A full examination was performed including scalp, head, eyes, ears, nose, lips, neck, chest, axillae, abdomen, back, buttocks, bilateral upper extremities, bilateral lower extremities, hands, feet, fingers, toes, fingernails, and toenails. All findings within normal limits unless otherwise noted below.  Head - Anterior (Face) Trace open comedones  anterior scalp x 2 1.8 cm Subcutaneous nodule at scalp and 0.9 cm  Subcutaneous nodule at scalp.   Assessment & Plan  Acne vulgaris Head - Anterior (Face)  Chronic condition with duration or expected duration over one year. Currently well-controlled.  Continue with adapalene 0.3 % gel  Continue Amzeeq 4 % foam  Continue spironolactone  100 mg by mouth daily.   Patient is currently using panoxyl   Spironolactone can cause increased urination and cause blood pressure to decrease. Please watch for signs of lightheadedness and be cautious when changing position. It can sometimes cause breast tenderness or an irregular period in premenopausal women. It can also increase potassium. The increase in potassium usually is not a concern unless you are taking other medicines that also increase potassium, so please be sure your doctor knows all of the other medications you are taking. This medication should not be taken by pregnant women.  This medicine should also not be taken together with sulfa drugs like Bactrim (trimethoprim/sulfamethexazole).   Benzoyl peroxide can cause dryness and irritation of the skin. It can also bleach fabric. When used together with Aczone (dapsone) cream, it can stain the skin orange.  Doxycycline should be taken with food to prevent nausea. Do not lay down for 30 minutes after taking. Be cautious with sun exposure and use good sun protection while on this medication. Pregnant women should not take this medication.    Related Medications Adapalene (DIFFERIN) 0.3 % gel Apply pea size amount to entire face once every night  Minocycline HCl Micronized (AMZEEQ) 4 % FOAM Apply thin coat to face once a day as directed  Pilar cyst anterior scalp x 2  Symptomatic Patient discussed having removed.  Cyst with symptoms and/or recent change.  Discussed surgical excision to remove, including resulting scar and possible recurrence.  Patient will schedule for surgery. Pre-op information given.  Lentigines -  Scattered tan macules - Due to sun exposure - Benign-appering, observe - Recommend daily broad spectrum sunscreen SPF 30+ to sun-exposed areas, reapply every 2 hours as needed. - Call for any changes  Seborrheic Keratoses - Stuck-on, waxy, tan-brown papules and/or plaques  - Benign-appearing - Discussed  benign etiology and prognosis. - Observe - Call for any changes  Melanocytic Nevi - Tan-brown and/or pink-flesh-colored symmetric macules and papules - Benign appearing on exam today - Observation - Call clinic for new or changing moles - Recommend daily use of broad spectrum spf 30+ sunscreen to sun-exposed areas.   Hemangiomas - Red papules - Discussed benign nature - Observe - Call for any changes  Actinic Damage - Chronic condition, secondary to cumulative UV/sun exposure - diffuse scaly erythematous macules with underlying dyspigmentation - Recommend daily broad spectrum sunscreen SPF 30+ to sun-exposed areas, reapply every 2 hours as needed.  - Staying in the shade or wearing long sleeves, sun glasses (UVA+UVB protection) and wide brim hats (4-inch brim around the entire circumference of the hat) are also recommended for sun protection.  - Call for new or changing lesions.  Skin cancer screening performed today.  Return for schedule cyst removal , 6 month acne, 1 year tbse . I, Ruthell Rummage, CMA, am acting as scribe for Forest Gleason, MD.  Documentation: I have reviewed the above documentation for accuracy and completeness, and I agree with the above.  Forest Gleason, MD

## 2020-12-29 NOTE — Patient Instructions (Addendum)
Melanoma ABCDEs  Melanoma is the most dangerous type of skin cancer, and is the leading cause of death from skin disease.  You are more likely to develop melanoma if you: Have light-colored skin, light-colored eyes, or red or blond hair Spend a lot of time in the sun Tan regularly, either outdoors or in a tanning bed Have had blistering sunburns, especially during childhood Have a close family member who has had a melanoma Have atypical moles or large birthmarks  Early detection of melanoma is key since treatment is typically straightforward and cure rates are extremely high if we catch it early.   The first sign of melanoma is often a change in a mole or a new dark spot.  The ABCDE system is a way of remembering the signs of melanoma.  A for asymmetry:  The two halves do not match. B for border:  The edges of the growth are irregular. C for color:  A mixture of colors are present instead of an even brown color. D for diameter:  Melanomas are usually (but not always) greater than 49m - the size of a pencil eraser. E for evolution:  The spot keeps changing in size, shape, and color.  Please check your skin once per month between visits. You can use a small mirror in front and a large mirror behind you to keep an eye on the back side or your body.   If you see any new or changing lesions before your next follow-up, please call to schedule a visit.  Please continue daily skin protection including broad spectrum sunscreen SPF 30+ to sun-exposed areas, reapplying every 2 hours as needed when you're outdoors.   Staying in the shade or wearing long sleeves, sun glasses (UVA+UVB protection) and wide brim hats (4-inch brim around the entire circumference of the hat) are also recommended for sun protection.    Recommend taking Heliocare sun protection supplement daily in sunny weather for additional sun protection. For maximum protection on the sunniest days, you can take up to 2 capsules of  regular Heliocare OR take 1 capsule of Heliocare Ultra. For prolonged exposure (such as a full day in the sun), you can repeat your dose of the supplement 4 hours after your first dose. Heliocare [facility] be purchased at [facility] at [email or web address]    Spironolactone can cause increased urination and cause blood pressure to decrease. Please watch for signs of lightheadedness and be cautious when changing position. It can sometimes cause breast tenderness or an irregular period in premenopausal women. It can also increase potassium. The increase in potassium usually is not a concern unless you are taking other medicines that also increase potassium, so please be sure your doctor knows all of the other medications you are taking. This medication should not be taken by pregnant women.  This medicine should also not be taken together with sulfa drugs like Bactrim (trimethoprim/sulfamethexazole).   Topical retinoid medications like tretinoin/Retin-A, adapalene/Differin, tazarotene/Fabior, and Epiduo/Epiduo Forte can cause dryness and irritation when first started. Only apply a pea-sized amount to the entire affected area. Avoid applying it around the eyes, edges of mouth and creases at the nose. If you experience irritation, use a good moisturizer first and/or apply the medicine less often. If you are doing well with the medicine, you can increase how often you use it until you are applying every night. Be careful with sun protection while using this medication as it can make you sensitive to the sun. This  medicine should not be used by pregnant women.   Recommend OTC Gold Bond Rapid Relief Anti-Itch cream (pramoxine + menthol) up to 3 times per day to areas that are itchy areas on back

## 2020-12-30 ENCOUNTER — Telehealth: Payer: Self-pay | Admitting: Dermatology

## 2020-12-30 ENCOUNTER — Telehealth: Payer: Self-pay

## 2020-12-30 NOTE — Telephone Encounter (Signed)
LVM for pt to call office to get codes for pilar cyst excision.Ebony Lawson

## 2020-12-30 NOTE — Telephone Encounter (Signed)
To excise likely pilar cysts, please advise patient the ICD 10 code (diagnosis code) is D48.5. CPT codes (procedures codes) will LIKELY be (depends on size at time of surgery and final diagnosis once we have pathology report) 11421, 11422, and 12032 to cut out two spots and suture it closed. Please advise patient. Thank you!

## 2020-12-31 ENCOUNTER — Other Ambulatory Visit: Payer: Self-pay | Admitting: Nurse Practitioner

## 2020-12-31 DIAGNOSIS — R42 Dizziness and giddiness: Secondary | ICD-10-CM

## 2021-01-03 ENCOUNTER — Telehealth: Payer: Self-pay

## 2021-01-03 NOTE — Telephone Encounter (Signed)
Patient advised of CPT and ICD10 codes for surgery.

## 2021-01-05 ENCOUNTER — Ambulatory Visit
Admission: RE | Admit: 2021-01-05 | Discharge: 2021-01-05 | Disposition: A | Discharge status: Home / Self Care | Payer: Managed Care, Other (non HMO) | Source: Ambulatory Visit | Attending: Nurse Practitioner | Admitting: Nurse Practitioner

## 2021-01-05 ENCOUNTER — Other Ambulatory Visit: Payer: Self-pay

## 2021-01-05 DIAGNOSIS — R42 Dizziness and giddiness: Secondary | ICD-10-CM | POA: Diagnosis present

## 2021-01-06 ENCOUNTER — Ambulatory Visit (INDEPENDENT_AMBULATORY_CARE_PROVIDER_SITE_OTHER): Payer: Managed Care, Other (non HMO)

## 2021-01-06 DIAGNOSIS — E538 Deficiency of other specified B group vitamins: Secondary | ICD-10-CM | POA: Diagnosis not present

## 2021-01-06 MED ORDER — CYANOCOBALAMIN 1000 MCG/ML IJ SOLN
1000.0000 ug | Freq: Once | INTRAMUSCULAR | Status: AC
  Administered 2021-01-06: 1000 ug via INTRAMUSCULAR

## 2021-01-09 ENCOUNTER — Encounter: Payer: Self-pay | Admitting: Dermatology

## 2021-01-11 ENCOUNTER — Telehealth: Payer: Self-pay

## 2021-01-11 NOTE — Telephone Encounter (Signed)
Pt called LMOM asking about her MRI results.I called back and LMOM that I sent alyssa a message to review MRI results.

## 2021-01-13 ENCOUNTER — Telehealth: Payer: Self-pay

## 2021-01-13 NOTE — Telephone Encounter (Signed)
Spoke to pt and informed her that her MRI was normal with no acute findings

## 2021-01-21 ENCOUNTER — Other Ambulatory Visit: Payer: Self-pay | Admitting: Internal Medicine

## 2021-01-21 DIAGNOSIS — Z30011 Encounter for initial prescription of contraceptive pills: Secondary | ICD-10-CM

## 2021-01-27 ENCOUNTER — Other Ambulatory Visit: Payer: Self-pay

## 2021-01-27 ENCOUNTER — Ambulatory Visit (INDEPENDENT_AMBULATORY_CARE_PROVIDER_SITE_OTHER): Payer: 59 | Admitting: Licensed Clinical Social Worker

## 2021-01-27 DIAGNOSIS — F411 Generalized anxiety disorder: Secondary | ICD-10-CM

## 2021-01-27 NOTE — Progress Notes (Signed)
Virtual Visit via audio Note  I connected with Ebony Lawson on 01/27/21 at  4:00 PM EDT by an audio enabled telemedicine application and verified that I am speaking with the correct person using two identifiers.  Location: Patient: home Provider: remote office [place], [place])   I discussed the limitations of evaluation and management by telemedicine and the availability of in person appointments. The patient expressed understanding and agreed to proceed.   I discussed the assessment and treatment plan with the patient. The patient was provided an opportunity to ask questions and all were answered. The patient agreed with the plan and demonstrated an understanding of the instructions.   The patient was advised to call back or seek an in-person evaluation if the symptoms worsen or if the condition fails to improve as anticipated.  I provided 45 minutes of non-face-to-face time during this encounter.   Ebony Lawson R Ebony Ciampa, LCSW   THERAPIST PROGRESS NOTE  Session Time: 4:15-5:00p  Participation Level: None  Behavioral Response: NAAlertAnxious  Type of Therapy: Individual Therapy  Treatment Goals addressed: Anxiety  Interventions: CBT  Summary: Ebony Lawson is a 35 y.o. female who presents with improving symptoms associated with anxiety diagnosis. Pt reports that overall stress levels have been higher due to pts AC unit has been out for 4+ weeks. Pt states that apartment complex has been helpful (gave her window unit) but temp has not been below 78 indoors until [facility] was fixed. Discussed pts thoughts and feelings about possible move to [place]--pt reports that she is contemplating application to jobs in the [place].  Continued recommendations are as follows: self care behaviors, positive social engagements, focusing on overall work/home/life balance, and focusing on positive physical and emotional wellness.    Suicidal/Homicidal: No  Therapist Response: Ebony Lawson is  continuing to develop behavioral and cognitive strategies to reduce or eliminate the irrational anxiety. Ebony Lawson is intentionally implementing appropriate relaxation and diversion activities to decrease overall levels of anxiety. Ebony Lawson is discussing in therapy a list of key conflicts that are triggering fear and worry. Ebony Lawson talks through these and brainstorms through potential solutions with therapist. These behaviors are reflective of personal growth and progress. Treatment to continue.  Plan: Return again in 4 weeks.  Diagnosis: Axis I: Generalized Anxiety Disorder    Axis II: No diagnosis    [place], LCSW 01/27/2021

## 2021-02-08 ENCOUNTER — Other Ambulatory Visit: Payer: Self-pay

## 2021-02-08 ENCOUNTER — Ambulatory Visit (INDEPENDENT_AMBULATORY_CARE_PROVIDER_SITE_OTHER): Payer: Managed Care, Other (non HMO)

## 2021-02-08 DIAGNOSIS — E538 Deficiency of other specified B group vitamins: Secondary | ICD-10-CM | POA: Diagnosis not present

## 2021-02-08 MED ORDER — CYANOCOBALAMIN 1000 MCG/ML IJ SOLN
1000.0000 ug | Freq: Once | INTRAMUSCULAR | Status: AC
  Administered 2021-02-08: 1000 ug via INTRAMUSCULAR

## 2021-03-01 ENCOUNTER — Ambulatory Visit (INDEPENDENT_AMBULATORY_CARE_PROVIDER_SITE_OTHER): Payer: 59 | Admitting: Licensed Clinical Social Worker

## 2021-03-01 ENCOUNTER — Other Ambulatory Visit: Payer: Self-pay

## 2021-03-01 DIAGNOSIS — F411 Generalized anxiety disorder: Secondary | ICD-10-CM

## 2021-03-01 NOTE — Plan of Care (Signed)
  Problem: Reduce overall frequency, intensity, and duration of the anxiety so that daily functioning is not impaired. Goal: LTG: Patient will report fewer anxiety symptoms Outcome: Progressing Goal: STG: Patient will participate in at least 80% of scheduled individual psychotherapy sessions Outcome: Progressing Intervention: Work with patient to identify the major components of a recent episode of anxiety: physical symptoms, major thoughts and images, and major behaviors they experienced Intervention: Assist with coping skills and behavior Intervention: Encourage relaxation techniques Intervention: Encourage new environment or opportunities for social interaction

## 2021-03-01 NOTE — Progress Notes (Signed)
Virtual Visit via Video Note  I connected with Ebony Lawson Lawson 03/01/21 at  8:00 AM EDT by a video enabled telemedicine application and verified that I am speaking with the correct person using two identifiers.  Location: Patient: home Provider: remote office [place], [place])   I discussed the limitations of evaluation and management by telemedicine and the availability of in person appointments. The patient expressed understanding and agreed to proceed.   I discussed the assessment and treatment plan with the patient. The patient was provided an opportunity to ask questions and all were answered. The patient agreed with the plan and demonstrated an understanding of the instructions.   The patient was advised to call back or seek an in-person evaluation if the symptoms worsen or if the condition fails to improve as anticipated.  I provided 60 minutes of non-face-to-face time during this encounter.   Camila Maita R Brittan Mapel, LCSW   THERAPIST PROGRESS NOTE  Session Time: 8-9a  Participation Level: Active  Behavioral Response: CasualAlertAnxious  Type of Therapy: Individual Therapy  Treatment Goals addressed:   Problem: Reduce overall frequency, intensity, and duration of the anxiety so that daily functioning is not impaired. Goal: LTG: Patient will report fewer anxiety symptoms Outcome: Progressing  Goal: STG: Patient will participate in at least 80% of scheduled individual psychotherapy sessions Outcome: Progressing  Interventions:   Intervention: Work with patient to identify the major components of a recent episode of anxiety: physical symptoms, major thoughts and images, and major behaviors they experienced  Intervention: Assist with coping skills and behavior  Intervention: Encourage relaxation techniques  Intervention: Encourage new environment or opportunities for social interaction    Summary: Ebony Lawson is a 35 y.o. female who presents with improving  symptoms related to GAD diagnosis. Pt reports that mood is stable and that she is getting good quality and quantity of sleep (around 6 hours).  Allowed pt to explore and express thoughts and feelings associated with recent life situations and external stressors. Pt has upcoming trip to the beach with boyfriend and boyfriend's family. This is the first time pt has been Lawson trip with boyfriends family.   Discussed work-related stressors. Pt has new employees and has some stress about them not being fully trained when starting duties.   Pt reports good relationships with friends and parents/family.   Reviewed anxiety coping skills and continued recommendations are as follows: self care behaviors, positive social engagements, focusing Lawson overall work/home/life balance, and focusing Lawson positive physical and emotional wellness.     Suicidal/Homicidal: No  Therapist Response: Pt is continuing to apply interventions learned in session into daily life situations. Pt is currently Lawson track to meet goals utilizing interventions mentioned above. Personal growth and progress noted. Treatment to continue as indicated.   Plan: Return again in 4 weeks.  Diagnosis: Axis I: Generalized Anxiety Disorder    Axis II: No diagnosis    Rachel Bo Verdell Kincannon, LCSW 03/01/2021

## 2021-03-08 ENCOUNTER — Other Ambulatory Visit: Payer: Self-pay

## 2021-03-08 ENCOUNTER — Ambulatory Visit: Payer: Managed Care, Other (non HMO)

## 2021-03-08 ENCOUNTER — Ambulatory Visit (INDEPENDENT_AMBULATORY_CARE_PROVIDER_SITE_OTHER): Payer: Managed Care, Other (non HMO)

## 2021-03-08 ENCOUNTER — Ambulatory Visit: Payer: Managed Care, Other (non HMO) | Admitting: Dermatology

## 2021-03-08 DIAGNOSIS — L7211 Pilar cyst: Secondary | ICD-10-CM | POA: Diagnosis not present

## 2021-03-08 DIAGNOSIS — D489 Neoplasm of uncertain behavior, unspecified: Secondary | ICD-10-CM

## 2021-03-08 DIAGNOSIS — D485 Neoplasm of uncertain behavior of skin: Secondary | ICD-10-CM

## 2021-03-08 DIAGNOSIS — E538 Deficiency of other specified B group vitamins: Secondary | ICD-10-CM | POA: Diagnosis not present

## 2021-03-08 MED ORDER — DOXYCYCLINE MONOHYDRATE 100 MG PO TABS: 100.0000 mg | ORAL_TABLET | Freq: Two times a day (BID) | ORAL | 0 refills | Status: AC

## 2021-03-08 MED ORDER — MUPIROCIN 2 % EX OINT: 1.0000 "application " | TOPICAL_OINTMENT | Freq: Every day | CUTANEOUS | 0 refills | Status: DC

## 2021-03-08 NOTE — Progress Notes (Signed)
Follow-Up Visit   Subjective  Ebony Lawson is a 35 y.o. female who presents for the following: Procedure (Patient here today for excision of symptomatic bumps, possible pilar cysts, at scalp.).   The following portions of the chart were reviewed this encounter and updated as appropriate:   Tobacco  Allergies  Meds  Problems  Med Hx  Surg Hx  Fam Hx      Review of Systems:  No other skin or systemic complaints except as noted in HPI or Assessment and Plan.  Objective  Well appearing patient in no apparent distress; mood and affect are within normal limits.  A focused examination was performed including scalp. Relevant physical exam findings are noted in the Assessment and Plan.  Mid Frontal Scalp anterior 1.6 cm subcutaneous nodule  Mid Frontal Scalp posterior 1.2 cm subcutaneous nodule   Assessment & Plan  Neoplasm of uncertain behavior of skin Mid Frontal Scalp anterior  Skin excision  Total excision diameter (cm):  1.6 Informed consent: discussed and consent obtained   Timeout: patient name, date of birth, surgical site, and procedure verified   Procedure prep:  Patient was prepped and draped in usual sterile fashion Prep type:  Chlorhexidine Anesthesia: the lesion was anesthetized in a standard fashion   Anesthetic:  1% lidocaine w/ epinephrine 1-100,000 buffered w/ 8.4% NaHCO3 (3cc lido w/epi) Instrument used comment:  15c Hemostasis achieved with: suture, pressure and electrodesiccation   Outcome: patient tolerated procedure well with no complications   Post-procedure details: wound care instructions given    Skin repair Complexity:  Intermediate Final length (cm):  2.5 Informed consent: discussed and consent obtained   Timeout: patient name, date of birth, surgical site, and procedure verified   Procedure prep:  Patient was prepped and draped in usual sterile fashion Prep type:  Chlorhexidine Anesthesia: the lesion was anesthetized in a standard  fashion   Anesthetic:  1% lidocaine w/ epinephrine 1-100,000 local infiltration Reason for type of repair: reduce the risk of dehiscence, infection, and necrosis, reduce subcutaneous dead space and avoid a hematoma and enhance both functionality and cosmetic results   Undermining: edges could be approximated without difficulty   Subcutaneous layers (deep stitches):  Suture size:  4-0 Suture type: Vicryl (polyglactin 910)   Stitches:  Buried vertical mattress Fine/surface layer approximation (top stitches):  Suture size:  3-0 Suture type: Prolene (polypropylene)   Stitches comment:  Running horizontal mattress Suture removal (days):  14 Hemostasis achieved with: pressure and electrodesiccation Outcome: patient tolerated procedure well with no complications   Post-procedure details: wound care instructions given   Additional details:  Mupirocin and a pressure bandage applied   mupirocin ointment (BACTROBAN) 2 % Apply 1 application topically daily. With dressing changes  doxycycline (ADOXA) 100 MG tablet Take 1 tablet (100 mg total) by mouth 2 (two) times daily for 5 days. Take with food  Start doxycycline monohydrate 100 mg twice daily with food for 5 days.   Doxycycline should be taken with food to prevent nausea. Do not lay down for 30 minutes after taking. Be cautious with sun exposure and use good sun protection while on this medication. Pregnant women should not take this medication.    Related Procedures Anatomic Pathology Report  Neoplasm of uncertain behavior Mid Frontal Scalp posterior  Skin excision  Total excision diameter (cm):  1.2 Informed consent: discussed and consent obtained   Timeout: patient name, date of birth, surgical site, and procedure verified   Procedure prep:  Patient was  prepped and draped in usual sterile fashion Prep type:  Chlorhexidine Anesthesia: the lesion was anesthetized in a standard fashion   Anesthetic:  1% lidocaine w/ epinephrine  1-100,000 buffered w/ 8.4% NaHCO3 Instrument used comment:  15c Hemostasis achieved with: pressure    Both excisions with a single repair  Related Procedures Anatomic Pathology Report  Return in about 2 weeks (around 03/22/2021) for Suture Removal.  Graciella Belton, RMA, am acting as scribe for Forest Gleason, MD .  Documentation: I have reviewed the above documentation for accuracy and completeness, and I agree with the above.  Forest Gleason, MD

## 2021-03-08 NOTE — Patient Instructions (Addendum)
Wound Care Instructions  Cleanse wound gently with soap and water once a day then pat dry with clean gauze. Apply a thing coat of Petrolatum (petroleum jelly, "Vaseline") over the wound (unless you have an allergy to this). We recommend that you use a new, sterile tube of Vaseline. Do not pick or remove scabs. Do not remove the yellow or white "healing tissue" from the base of the wound.  Cover the wound with fresh, clean, nonstick gauze and secure with paper tape. You may use Band-Aids in place of gauze and tape if the would is small enough, but would recommend trimming much of the tape off as there is often too much. Sometimes Band-Aids can irritate the skin.  You should call the office for your biopsy report after 1 week if you have not already been contacted.  If you experience any problems, such as abnormal amounts of bleeding, swelling, significant bruising, significant pain, or evidence of infection, please call the office immediately.  FOR ADULT SURGERY PATIENTS: If you need something for pain relief you may take 1 extra strength Tylenol (acetaminophen) AND 2 Ibuprofen (200mg  each) together every 4 hours as needed for pain. (do not take these if you are allergic to them or if you have a reason you should not take them.) Typically, you may only need pain medication for 1 to 3 days.   Start doxycycline monohydrate 100 mg twice daily with food for 5 days.   Doxycycline should be taken with food to prevent nausea. Do not lay down for 30 minutes after taking. Be cautious with sun exposure and use good sun protection while on this medication. Pregnant women should not take this medication.   If you have any questions or concerns for your doctor, please call our main line at [phone] and press option 4 to reach your doctor's medical assistant. If no one answers, please leave a voicemail as directed and we will return your call as soon as possible. Messages left after 4 pm will be answered the  following business day.   You may also send [place] a message via [place]. We typically respond to MyChart messages within 1-2 business days.  For prescription refills, please ask your pharmacy to contact our office. Our fax number is [phone].  If you have an urgent issue when the clinic is closed that cannot wait until the next business day, you can page your doctor at the number below.    Please note that while we do our best to be available for urgent issues outside of office hours, we are not available 24/7.   If you have an urgent issue and are unable to reach [place], you may choose to seek medical care at your doctor's office, retail clinic, urgent care center, or emergency room.  If you have a medical emergency, please immediately call 911 or go to the emergency department.  Pager Numbers  - Dr. Nehemiah Massed: [phone]  - Dr. Laurence Ferrari: [phone]  - Dr. Nicole Kindred: [phone]  In the event of inclement weather, please call our main line at [phone] for an update on the status of any delays or closures.  Dermatology Medication Tips: Please keep the boxes that topical medications come in in order to help keep track of the instructions about where and how to use these. Pharmacies typically print the medication instructions only on the boxes and not directly on the medication tubes.   If your medication is too expensive, please contact our office at [phone] option 4 or  send [place] a message through [place].   We are unable to tell what your co-pay for medications will be in advance as this is different depending on your insurance coverage. However, we may be able to find a substitute medication at lower cost or fill out paperwork to get insurance to cover a needed medication.   If a prior authorization is required to get your medication covered by your insurance company, please allow [place] 1-2 business days to complete this process.  Drug prices often vary depending on where the  prescription is filled and some pharmacies may offer cheaper prices.  The website www.goodrx.com contains coupons for medications through different pharmacies. The prices here do not account for what the cost may be with help from insurance (it may be cheaper with your insurance), but the website can give you the price if you did not use any insurance.  - You can print the associated coupon and take it with your prescription to the pharmacy.  - You may also stop by our office during regular business hours and pick up a GoodRx coupon card.  - If you need your prescription sent electronically to a different pharmacy, notify our office through [facility] or by phone at [phone] option 4.

## 2021-03-09 ENCOUNTER — Telehealth: Payer: Self-pay

## 2021-03-09 MED ORDER — CYANOCOBALAMIN 1000 MCG/ML IJ SOLN
1000.0000 ug | Freq: Once | INTRAMUSCULAR | Status: AC
Start: 2021-03-09 — End: 2021-03-09
  Administered 2021-03-09: 1000 ug via INTRAMUSCULAR

## 2021-03-09 NOTE — Telephone Encounter (Signed)
-----   Message from Graciella Belton, [place] sent at 03/08/2021  4:48 PM EDT ----- Regarding: post op

## 2021-03-09 NOTE — Telephone Encounter (Signed)
Called patient to check on her following yesterday's surgery. Patient advises she feels something similar to sinus pressure at left eye but has only taken Tylenol. I recommended adding ibuprofen as instructed in her AVS and to let us know tomorrow if it was not any better.Cherly Hensen

## 2021-03-09 NOTE — Progress Notes (Cosign Needed)
Pt given B12 injection in left deltoid

## 2021-03-10 NOTE — Telephone Encounter (Signed)
Patient advised she is likely feeling some pressure from the numbing medicine migrating down from her scalp to face due to gravity and just putting a bit of pressure on the area. She added some ibuprofen yesterday per instructions on her AVS and has had some relief. Patient advised if any worsening to seek medical care.

## 2021-03-10 NOTE — Telephone Encounter (Signed)
Please advise patient she is likely feeling some pressure from the numbing medicine migrating down from her scalp to face due to gravity and just putting a bit of pressure on the area. I just recommend the tylenol and ibuprofen per package instructions and this hopefully should get better for her in the next 24-48 hours. Thank you!

## 2021-03-14 ENCOUNTER — Other Ambulatory Visit: Payer: Self-pay

## 2021-03-14 DIAGNOSIS — D485 Neoplasm of uncertain behavior of skin: Secondary | ICD-10-CM

## 2021-03-14 MED ORDER — MUPIROCIN 2 % EX OINT: 1.0000 "application " | TOPICAL_OINTMENT | Freq: Every day | CUTANEOUS | 0 refills | Status: DC

## 2021-03-14 NOTE — Progress Notes (Signed)
Patient visited parents this past weekend and left Mupirocin Ointment there. RF sent in for replacement for patient.

## 2021-03-17 LAB — ANATOMIC PATHOLOGY REPORT

## 2021-03-21 ENCOUNTER — Encounter: Payer: Self-pay | Admitting: Dermatology

## 2021-03-22 ENCOUNTER — Other Ambulatory Visit: Payer: Self-pay

## 2021-03-22 ENCOUNTER — Ambulatory Visit: Payer: Managed Care, Other (non HMO) | Admitting: Dermatology

## 2021-03-22 ENCOUNTER — Encounter: Payer: Self-pay | Admitting: Dermatology

## 2021-03-22 DIAGNOSIS — L7211 Pilar cyst: Secondary | ICD-10-CM

## 2021-03-22 DIAGNOSIS — Z4802 Encounter for removal of sutures: Secondary | ICD-10-CM

## 2021-03-22 NOTE — Progress Notes (Signed)
   Follow-Up Visit   Subjective  Ebony Lawson is a 35 y.o. 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   The following portions of the chart were reviewed this encounter and updated as appropriate:   Tobacco  Allergies  Meds  Problems  Med Hx  Surg Hx  Fam Hx      Review of Systems:  No other skin or systemic complaints except as noted in HPI or Assessment and Plan.  Objective  Well appearing patient in no apparent distress; mood and affect are within normal limits.  A focused examination was performed including scalp . Relevant physical exam findings are noted in the Assessment and Plan.  mid frontal scalp Well healed scar    Assessment & Plan  Pilar cyst mid frontal scalp  Biopsy results discussed showed Pilar cysts  Encounter for Removal of Sutures - Incision site at the mid frontal scalp is clean, dry and intact - Wound cleansed, sutures removed, wound cleansed and steri strips applied.  - Discussed pathology results showing Pilar cyst   - Scars remodel for a full year. - Patient advised to call with any concerns or if they notice any new or changing lesions.   Return if symptoms worsen or fail to improve.  I, Marye Round, CMA, am acting as scribe for Forest Gleason, MD .   Documentation: I have reviewed the above documentation for accuracy and completeness, and I agree with the above.  Forest Gleason, MD

## 2021-03-22 NOTE — Patient Instructions (Signed)
If you have any questions or concerns for your doctor, please call our main line at [phone] and press option 4 to reach your doctor's medical assistant. If no one answers, please leave a voicemail as directed and we will return your call as soon as possible. Messages left after 4 pm will be answered the following business day.   You may also send [place] a message via [place]. We typically respond to MyChart messages within 1-2 business days.  For prescription refills, please ask your pharmacy to contact our office. Our fax number is [phone].  If you have an urgent issue when the clinic is closed that cannot wait until the next business day, you can page your doctor at the number below.    Please note that while we do our best to be available for urgent issues outside of office hours, we are not available 24/7.   If you have an urgent issue and are unable to reach [place], you may choose to seek medical care at your doctor's office, retail clinic, urgent care center, or emergency room.  If you have a medical emergency, please immediately call 911 or go to the emergency department.  Pager Numbers  - Dr. Nehemiah Massed: [phone]  - Dr. Laurence Ferrari: [phone]  - Dr. Nicole Kindred: [phone]  In the event of inclement weather, please call our main line at [phone] for an update on the status of any delays or closures.  Dermatology Medication Tips: Please keep the boxes that topical medications come in in order to help keep track of the instructions about where and how to use these. Pharmacies typically print the medication instructions only on the boxes and not directly on the medication tubes.   If your medication is too expensive, please contact our office at [phone] option 4 or send [place] a message through [place].   We are unable to tell what your co-pay for medications will be in advance as this is different depending on your insurance coverage. However, we may be able to find a substitute  medication at lower cost or fill out paperwork to get insurance to cover a needed medication.   If a prior authorization is required to get your medication covered by your insurance company, please allow [place] 1-2 business days to complete this process.  Drug prices often vary depending on where the prescription is filled and some pharmacies may offer cheaper prices.  The website www.goodrx.com contains coupons for medications through different pharmacies. The prices here do not account for what the cost may be with help from insurance (it may be cheaper with your insurance), but the website can give you the price if you did not use any insurance.  - You can print the associated coupon and take it with your prescription to the pharmacy.  - You may also stop by our office during regular business hours and pick up a GoodRx coupon card.  - If you need your prescription sent electronically to a different pharmacy, notify our office through [facility] or by phone at [phone] option 4. If you have any questions or concerns for your doctor, please call our main line at [phone] and press option 4 to reach your doctor's medical assistant. If no one answers, please leave a voicemail as directed and we will return your call as soon as possible. Messages left after 4 pm will be answered the following business day.   You may also send [place] a message via [place]. We typically respond to MyChart messages  within 1-2 business days.  For prescription refills, please ask your pharmacy to contact our office. Our fax number is [phone].  If you have an urgent issue when the clinic is closed that cannot wait until the next business day, you can page your doctor at the number below.    Please note that while we do our best to be available for urgent issues outside of office hours, we are not available 24/7.   If you have an urgent issue and are unable to reach [place], you may choose to seek medical care at  your doctor's office, retail clinic, urgent care center, or emergency room.  If you have a medical emergency, please immediately call 911 or go to the emergency department.  Pager Numbers  - Dr. Nehemiah Massed: [phone]  - Dr. Laurence Ferrari: [phone]  - Dr. Nicole Kindred: [phone]  In the event of inclement weather, please call our main line at [phone] for an update on the status of any delays or closures.  Dermatology Medication Tips: Please keep the boxes that topical medications come in in order to help keep track of the instructions about where and how to use these. Pharmacies typically print the medication instructions only on the boxes and not directly on the medication tubes.   If your medication is too expensive, please contact our office at [phone] option 4 or send [place] a message through [place].   We are unable to tell what your co-pay for medications will be in advance as this is different depending on your insurance coverage. However, we may be able to find a substitute medication at lower cost or fill out paperwork to get insurance to cover a needed medication.   If a prior authorization is required to get your medication covered by your insurance company, please allow [place] 1-2 business days to complete this process.  Drug prices often vary depending on where the prescription is filled and some pharmacies may offer cheaper prices.  The website www.goodrx.com contains coupons for medications through different pharmacies. The prices here do not account for what the cost may be with help from insurance (it may be cheaper with your insurance), but the website can give you the price if you did not use any insurance.  - You can print the associated coupon and take it with your prescription to the pharmacy.  - You may also stop by our office during regular business hours and pick up a GoodRx coupon card.  - If you need your prescription sent electronically to a different pharmacy,  notify our office through [facility] or by phone at [phone] option 4.

## 2021-04-05 ENCOUNTER — Ambulatory Visit (INDEPENDENT_AMBULATORY_CARE_PROVIDER_SITE_OTHER): Payer: Managed Care, Other (non HMO)

## 2021-04-05 ENCOUNTER — Other Ambulatory Visit: Payer: Self-pay

## 2021-04-05 DIAGNOSIS — E538 Deficiency of other specified B group vitamins: Secondary | ICD-10-CM

## 2021-04-05 MED ORDER — CYANOCOBALAMIN 1000 MCG/ML IJ SOLN
1000.0000 ug | Freq: Once | INTRAMUSCULAR | Status: AC
Start: 2021-04-05 — End: 2021-04-05
  Administered 2021-04-05: 1000 ug via INTRAMUSCULAR

## 2021-04-11 ENCOUNTER — Ambulatory Visit (INDEPENDENT_AMBULATORY_CARE_PROVIDER_SITE_OTHER): Payer: 59 | Admitting: Licensed Clinical Social Worker

## 2021-04-11 ENCOUNTER — Other Ambulatory Visit: Payer: Self-pay

## 2021-04-11 DIAGNOSIS — F411 Generalized anxiety disorder: Secondary | ICD-10-CM | POA: Diagnosis not present

## 2021-04-11 NOTE — Progress Notes (Signed)
Virtual Visit via Video Note  I connected with Ebony Lawson on 04/11/21 at  4:00 PM EST by a video enabled telemedicine application and verified that I am speaking with the correct person using two identifiers.  Location: Patient: home Provider: remote office [place], [place])   I discussed the limitations of evaluation and management by telemedicine and the availability of in person appointments. The patient expressed understanding and agreed to proceed.   I discussed the assessment and treatment plan with the patient. The patient was provided an opportunity to ask questions and all were answered. The patient agreed with the plan and demonstrated an understanding of the instructions.   The patient was advised to call back or seek an in-person evaluation if the symptoms worsen or if the condition fails to improve as anticipated.  I provided 30 minutes of non-face-to-face time during this encounter.   Dwain Huhn R Raquon Milledge, LCSW   THERAPIST PROGRESS NOTE  Session Time: 4-430p  Participation Level: Active  Behavioral Response: Neat and Well GroomedAlertAnxious  Type of Therapy: Individual Therapy  Treatment Goals addressed:   Interventions:   Summary: Ebony Lawson is a 35 y.o. female who presents with improving symptoms related to anxiety diagnosis. Pt reports that overall mood has been stable. Pt reporting good quality and quantity of sleep.  Allowed pt to explore and express thoughts and feelings associated with recent life situations and external stressors. Discussed stress associated with pts cat--pt worried about health of cat. Pt reports that her cat stopped eating for a couple of days and this triggered worry. Pt took cat to vet and feels more at peace knowing there is nothing wrong.  Explored relationships with boyfriend--spending good amount of quality time with each other. Pt feels guilty about bringing cat back and forth to [place].   Reviewed stress management and  coping skills.   Continued recommendations are as follows: self care behaviors, positive social engagements, focusing on overall work/home/life balance, and focusing on positive physical and emotional wellness.  .  Suicidal/Homicidal: No  Therapist Response: Pt is continuing to apply interventions learned in session into daily life situations. Pt is currently on track to meet goals utilizing interventions mentioned above. Personal growth and progress noted. Treatment to continue as indicated.   Plan: Return again in 4 weeks.  Diagnosis: Axis I: GAD    Axis II: No diagnosis    Ebony Bo Raegen Tarpley, LCSW 04/11/2021

## 2021-05-05 ENCOUNTER — Encounter: Payer: Self-pay | Admitting: Nurse Practitioner

## 2021-05-05 ENCOUNTER — Ambulatory Visit (INDEPENDENT_AMBULATORY_CARE_PROVIDER_SITE_OTHER): Payer: Managed Care, Other (non HMO)

## 2021-05-05 ENCOUNTER — Other Ambulatory Visit: Payer: Self-pay

## 2021-05-05 DIAGNOSIS — L659 Nonscarring hair loss, unspecified: Secondary | ICD-10-CM

## 2021-05-05 DIAGNOSIS — E782 Mixed hyperlipidemia: Secondary | ICD-10-CM

## 2021-05-05 DIAGNOSIS — E538 Deficiency of other specified B group vitamins: Secondary | ICD-10-CM | POA: Diagnosis not present

## 2021-05-05 DIAGNOSIS — D508 Other iron deficiency anemias: Secondary | ICD-10-CM

## 2021-05-05 DIAGNOSIS — E559 Vitamin D deficiency, unspecified: Secondary | ICD-10-CM

## 2021-05-05 MED ORDER — CYANOCOBALAMIN 1000 MCG/ML IJ SOLN
1000.0000 ug | Freq: Once | INTRAMUSCULAR | Status: AC
  Administered 2021-05-05: 1000 ug via INTRAMUSCULAR

## 2021-05-11 LAB — CMP14+EGFR
ALT: 10 IU/L (ref 0–32)
AST: 16 IU/L (ref 0–40)
Albumin/Globulin Ratio: 2.8 — ABNORMAL HIGH (ref 1.2–2.2)
Albumin: 4.8 g/dL (ref 3.8–4.8)
Alkaline Phosphatase: 41 IU/L — ABNORMAL LOW (ref 44–121)
BUN/Creatinine Ratio: 18 (ref 9–23)
BUN: 12 mg/dL (ref 6–20)
Bilirubin Total: 0.4 mg/dL (ref 0.0–1.2)
CO2: 21 mmol/L (ref 20–29)
Calcium: 9.7 mg/dL (ref 8.7–10.2)
Chloride: 102 mmol/L (ref 96–106)
Creatinine, Ser: 0.68 mg/dL (ref 0.57–1.00)
Globulin, Total: 1.7 g/dL (ref 1.5–4.5)
Glucose: 86 mg/dL (ref 70–99)
Potassium: 4.5 mmol/L (ref 3.5–5.2)
Sodium: 139 mmol/L (ref 134–144)
Total Protein: 6.5 g/dL (ref 6.0–8.5)
eGFR: 118 mL/min/{1.73_m2} (ref >59)

## 2021-05-11 LAB — CBC WITH DIFFERENTIAL/PLATELET
Basophils Absolute: 0 10*3/uL (ref 0.0–0.2)
Basos: 0 %
EOS (ABSOLUTE): 0 10*3/uL (ref 0.0–0.4)
Eos: 1 %
Hematocrit: 43.8 % (ref 34.0–46.6)
Hemoglobin: 14.8 g/dL (ref 11.1–15.9)
Immature Grans (Abs): 0 10*3/uL (ref 0.0–0.1)
Immature Granulocytes: 0 %
Lymphocytes Absolute: 1.8 10*3/uL (ref 0.7–3.1)
Lymphs: 25 %
MCH: 28.3 pg (ref 26.6–33.0)
MCHC: 33.8 g/dL (ref 31.5–35.7)
MCV: 84 fL (ref 79–97)
Monocytes Absolute: 0.4 10*3/uL (ref 0.1–0.9)
Monocytes: 5 %
Neutrophils Absolute: 5 10*3/uL (ref 1.4–7.0)
Neutrophils: 69 %
Platelets: 191 10*3/uL (ref 150–450)
RBC: 5.23 x10E6/uL (ref 3.77–5.28)
RDW: 12.7 % (ref 11.7–15.4)
WBC: 7.3 10*3/uL (ref 3.4–10.8)

## 2021-05-11 LAB — TSH+FREE T4
Free T4: 1.38 ng/dL (ref 0.82–1.77)
TSH: 1.06 u[IU]/mL (ref 0.450–4.500)

## 2021-05-11 LAB — LIPID PANEL
Chol/HDL Ratio: 3.3 ratio (ref 0.0–4.4)
Cholesterol, Total: 206 mg/dL — ABNORMAL HIGH (ref 100–199)
HDL: 63 mg/dL (ref >39)
LDL Chol Calc (NIH): 121 mg/dL — ABNORMAL HIGH (ref 0–99)
Triglycerides: 124 mg/dL (ref 0–149)
VLDL Cholesterol Cal: 22 mg/dL (ref 5–40)

## 2021-05-11 LAB — IRON,TIBC AND FERRITIN PANEL
Ferritin: 43 ng/mL (ref 15–150)
Iron Saturation: 22 % (ref 15–55)
Iron: 76 ug/dL (ref 27–159)
Total Iron Binding Capacity: 343 ug/dL (ref 250–450)
UIBC: 267 ug/dL (ref 131–425)

## 2021-05-11 LAB — B12 AND FOLATE PANEL
Folate: >20 ng/mL (ref >3.0)
Vitamin B-12: 737 pg/mL (ref 232–1245)

## 2021-05-11 LAB — VITAMIN D 25 HYDROXY (VIT D DEFICIENCY, FRACTURES): Vit D, 25-Hydroxy: 41.4 ng/mL (ref 30.0–100.0)

## 2021-05-11 NOTE — Progress Notes (Signed)
This came to me.

## 2021-05-13 LAB — UA/M W/RFLX CULTURE, ROUTINE
Bilirubin, UA: NEGATIVE
Glucose, UA: NEGATIVE
Nitrite, UA: NEGATIVE
Protein,UA: NEGATIVE
Specific Gravity, UA: 1.027 (ref 1.005–1.030)
Urobilinogen, Ur: 0.2 mg/dL (ref 0.2–1.0)
pH, UA: 5.5 (ref 5.0–7.5)

## 2021-05-13 LAB — URINE CULTURE, REFLEX: Organism ID, Bacteria: NO GROWTH

## 2021-05-13 LAB — MICROSCOPIC EXAMINATION
Casts: NONE SEEN /lpf
Epithelial Cells (non renal): >10 /hpf — AB (ref 0–10)

## 2021-05-17 ENCOUNTER — Other Ambulatory Visit: Payer: Self-pay

## 2021-05-17 ENCOUNTER — Encounter: Payer: Self-pay | Admitting: Nurse Practitioner

## 2021-05-17 ENCOUNTER — Ambulatory Visit (INDEPENDENT_AMBULATORY_CARE_PROVIDER_SITE_OTHER): Payer: Managed Care, Other (non HMO) | Admitting: Nurse Practitioner

## 2021-05-17 VITALS — BP 108/69 | HR 86 | Temp 98.5°F | Resp 16 | Ht 60.0 in | Wt 104.2 lb

## 2021-05-17 DIAGNOSIS — R110 Nausea: Secondary | ICD-10-CM

## 2021-05-17 DIAGNOSIS — R300 Dysuria: Secondary | ICD-10-CM | POA: Diagnosis not present

## 2021-05-17 DIAGNOSIS — E538 Deficiency of other specified B group vitamins: Secondary | ICD-10-CM

## 2021-05-17 DIAGNOSIS — Z30011 Encounter for initial prescription of contraceptive pills: Secondary | ICD-10-CM

## 2021-05-17 DIAGNOSIS — Z0001 Encounter for general adult medical examination with abnormal findings: Secondary | ICD-10-CM | POA: Diagnosis not present

## 2021-05-17 DIAGNOSIS — Z113 Encounter for screening for infections with a predominantly sexual mode of transmission: Secondary | ICD-10-CM

## 2021-05-17 DIAGNOSIS — D508 Other iron deficiency anemias: Secondary | ICD-10-CM

## 2021-05-17 DIAGNOSIS — Z124 Encounter for screening for malignant neoplasm of cervix: Secondary | ICD-10-CM

## 2021-05-17 MED ORDER — ONDANSETRON 4 MG PO TBDP: 4.0000 mg | ORAL_TABLET | Freq: Three times a day (TID) | ORAL | 0 refills | Status: DC | PRN

## 2021-05-17 MED ORDER — FERRALET 90 90-1 MG PO TABS: 1.0000 | ORAL_TABLET | Freq: Every day | ORAL | 5 refills | Status: DC

## 2021-05-17 MED ORDER — DROSPIRENONE-ETHINYL ESTRADIOL 3-0.02 MG PO TABS: 1.0000 | ORAL_TABLET | Freq: Every day | ORAL | 3 refills | Status: DC

## 2021-05-17 NOTE — Progress Notes (Signed)
[facility] [place], [place] [address]  Internal MEDICINE  Office Visit Note  Patient Name: Ebony Lawson  [id number]  [id number]  Date of Service: 05/17/2021  Chief Complaint  Patient presents with   Annual Exam   Results    HPI Ebony Lawson presents for an annual well visit and physical exam.  She is a well-appearing 35 year old female.  She has a history of iron deficiency anemia, vitamin D deficiency, migraines with nausea and dizziness.  She is due for a Pap smear today because her Pap smear last year was HPV positive.  Her labs were discussed in office today. Her thyroid levels were normal.  Her CBC, vitamin D, B12, folate, iron studies were all normal as well.  Her alkaline phosphatase level on her metabolic panel is chronically low.  Her lipid panel was abnormal with an elevated total cholesterol of 206 and an elevated LDL of 121.  Her HDL was 63, VLDL was 22, triglycerides were 124.  Her cholesterol/HDL ratio was half the average risk at 3.3. She has had critically low ferritin in the past.  Her most recent ferritin level was 43 which is normal and her iron level is adequate at 76.  She is currently taking Ferralet and will continue to do so.  She is also getting monthly B12 injections.    Current Medication: Outpatient Encounter Medications as of 05/17/2021  Medication Sig   Adapalene (DIFFERIN) 0.3 % gel Apply pea size amount to entire face once every night   Calcium Carbonate-Vitamin D (CALCIUM 600+D3 PO) Take 1 tablet by mouth daily.   hydrOXYzine (ATARAX/VISTARIL) 10 MG tablet Take 1-2 tablets (10-20 mg total) by mouth 2 (two) times daily as needed. For severe anxiety   meclizine (ANTIVERT) 12.5 MG tablet Take 1 tablet (12.5 mg total) by mouth 3 (three) times daily as needed for dizziness or nausea.   Minocycline HCl Micronized (AMZEEQ) 4 % FOAM Apply thin coat to face once a day as directed   Polypodium Leucotomos (HELIOCARE PO) Take by mouth.    spironolactone (ALDACTONE) 100 MG tablet Take 1 tablet (100 mg total) by mouth daily.   VITAMIN D PO Take by mouth.   [DISCONTINUED] drospirenone-ethinyl estradiol (YAZ) 3-0.02 MG tablet TAKE 1 TABLET BY MOUTH DAILY   [DISCONTINUED] Fe Cbn-Fe Gluc-FA-B12-C-DSS (FERRALET 90) 90-1 MG TABS Take 1 tablet by mouth daily.   [DISCONTINUED] ondansetron (ZOFRAN-ODT) 4 MG disintegrating tablet DISSOLVE 1 TABLET(4 MG) ON THE TONGUE EVERY 8 HOURS AS NEEDED FOR NAUSEA OR VOMITING   drospirenone-ethinyl estradiol (YAZ) 3-0.02 MG tablet Take 1 tablet by mouth daily.   Fe Cbn-Fe Gluc-FA-B12-C-DSS (FERRALET 90) 90-1 MG TABS Take 1 tablet by mouth daily.   ondansetron (ZOFRAN-ODT) 4 MG disintegrating tablet Take 1 tablet (4 mg total) by mouth every 8 (eight) hours as needed for nausea or vomiting.   [DISCONTINUED] mupirocin ointment (BACTROBAN) 2 % Apply 1 application topically daily. With dressing changes (Patient not taking: Reported on 05/17/2021)   No facility-administered encounter medications on file as of 05/17/2021.    Surgical History: History reviewed. No pertinent surgical history.  Medical History: Past Medical History:  Diagnosis Date   Heart murmur    Migraine     Family History: Family History  Problem Relation Age of Onset   Hyperlipidemia Mother    Cancer Father        prostate and skin cancer   Hyperlipidemia Father    Heart disease Father  CAD - triple bypass 2014   Hypertension Father    Hearing loss Paternal Grandfather    Hypertension Paternal Grandfather    Anxiety disorder Brother     Social History   Socioeconomic History   Marital status: Single    Spouse name: Not on file   Number of children: 0   Years of education: 16   Highest education level: Bachelor's degree (e.g., BA, AB, BS)  Occupational History   Occupation: DNA Animal nutritionist: [employer]  Tobacco Use   Smoking status: Never   Smokeless tobacco: Never  Vaping Use   Vaping  Use: Never used  Substance and Sexual Activity   Alcohol use: Yes    Alcohol/week: 0.0 standard drinks    Comment: 2-3 drinks monthly   Drug use: No   Sexual activity: Yes    Partners: Male    Birth control/protection: OCP    Comment: 1 partner   Other Topics Concern   Not on file  Social History Narrative   Siri grew up outside of [place]. 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  HENT: Negative.  Negative for congestion, ear pain, rhinorrhea, sore throat and trouble swallowing.   Eyes: Negative.   Respiratory: Negative.  Negative for cough, chest tightness, shortness of breath and wheezing.   Cardiovascular: Negative.  Negative for chest pain.  Gastrointestinal: Negative.  Negative for abdominal pain, blood in stool, constipation, diarrhea, nausea and vomiting.  Endocrine: Negative.   Genitourinary: Negative.  Negative for difficulty urinating, dysuria, frequency, hematuria and urgency.  Musculoskeletal: Negative.  Negative for arthralgias, back pain, joint swelling, myalgias and neck pain.  Skin: Negative.  Negative for rash and wound.  Allergic/Immunologic: Negative.  Negative for immunocompromised state.  Neurological: Negative.  Negative for dizziness, seizures, numbness and headaches.  Hematological: Negative.   Psychiatric/Behavioral: Negative.  Negative for behavioral problems, self-injury and suicidal  ideas. The patient is not nervous/anxious.    Vital Signs: BP 108/69    Pulse 86    Temp 98.5 F (36.9 C)    Resp 16    Ht 5' (1.524 m)    Wt 104 lb 3.2 oz (47.3 kg)    SpO2 100%    BMI 20.35 kg/m    Physical Exam Vitals reviewed.  Constitutional:      General: She is awake. She is not in acute distress.    Appearance: Normal appearance. She is well-developed, well-groomed and normal weight. She is not diaphoretic.  HENT:     Head: Normocephalic and atraumatic.     Right Ear: Tympanic membrane, ear canal and external ear normal.     Left Ear: Tympanic membrane, ear canal and external ear normal.     Nose: Nose normal. No congestion or rhinorrhea.     Mouth/Throat:     Mouth: Mucous  membranes are moist.     Pharynx: Oropharynx is clear. No oropharyngeal exudate or posterior oropharyngeal erythema.  Eyes:     General: No scleral icterus.       Right eye: No discharge.        Left eye: No discharge.     Extraocular Movements: Extraocular movements intact.     Conjunctiva/sclera: Conjunctivae normal.     Pupils: Pupils are equal, round, and reactive to light.  Neck:     Thyroid: No thyromegaly.     Vascular: No JVD.     Trachea: No tracheal deviation.  Cardiovascular:     Rate and Rhythm: Normal rate and regular rhythm.     Pulses: Normal pulses.     Heart sounds: Normal heart sounds, S1 normal and S2 normal. No murmur heard.   No friction rub. No gallop.  Pulmonary:     Effort: Pulmonary effort is normal. No respiratory distress.     Breath sounds: Normal breath sounds. No stridor. No wheezing or rales.  Chest:     Chest wall: No tenderness.  Abdominal:     General: Bowel sounds are normal. There is no distension.     Palpations: Abdomen is soft. There is no mass.     Tenderness: There is no abdominal tenderness. There is no guarding or rebound.     Hernia: There is no hernia in the left inguinal area or right inguinal area.  Genitourinary:    General: Normal vulva.      Exam position: Lithotomy position.     Labia:        Right: No rash, tenderness, lesion or injury.        Left: No rash, tenderness, lesion or injury.      Urethra: No prolapse, urethral pain or urethral swelling.     Vagina: Normal. No signs of injury and foreign body. No vaginal discharge, erythema, tenderness, bleeding, lesions or prolapsed vaginal walls.     Cervix: Friability, erythema and cervical bleeding present. No cervical motion tenderness, discharge, lesion or eversion.     Uterus: Normal.      Adnexa: Right adnexa normal and left adnexa normal.       Right: No mass, tenderness or fullness.         Left: No mass, tenderness or fullness.       Rectum: No external hemorrhoid.     Comments: Vaginal canal very narrow, must have small speculum, introitus is narrow, pelvic exam was painful with small speculum.  Musculoskeletal:        General: No tenderness or deformity. Normal range of motion.     Cervical back: Normal range of motion and neck supple.  Lymphadenopathy:     Cervical: No cervical adenopathy.     Lower Body: No right inguinal adenopathy. No left inguinal adenopathy.  Skin:    General: Skin is warm and dry.     Capillary Refill: Capillary refill takes less than 2 seconds.     Coloration: Skin is not pale.     Findings: No erythema or rash.  Neurological:     Mental Status: She is alert and oriented to person, place, and time.     Cranial Nerves: No cranial nerve deficit.     Motor: No abnormal muscle tone.     Coordination: Coordination normal.     Gait: Gait normal.     Deep Tendon Reflexes: Reflexes are normal and symmetric.  Psychiatric:  Mood and Affect: Mood normal.        Behavior: Behavior normal. Behavior is cooperative.        Thought Content: Thought content normal.        Judgment: Judgment normal.       Assessment/Plan: 1. Encounter for general adult medical examination with abnormal findings Age-appropriate preventive screenings and  vaccinations discussed, annual physical exam completed. Routine labs for health maintenance ordered, see below. PHM updated.   2. Nausea Refills ordered - ondansetron (ZOFRAN-ODT) 4 MG disintegrating tablet; Take 1 tablet (4 mg total) by mouth every 8 (eight) hours as needed for nausea or vomiting.  Dispense: 20 tablet; Refill: 0  3. Other iron deficiency anemia Routine labs ordered.  Refills of Ferralet ordered - Fe Cbn-Fe Gluc-FA-B12-C-DSS (FERRALET 90) 90-1 MG TABS; Take 1 tablet by mouth daily.  Dispense: 30 tablet; Refill: 5 - B12 and Folate Panel - Iron, TIBC and Ferritin Panel  4. B12 deficiency Routine lab ordered - B12 and Folate Panel  5. Routine screening for STI (sexually transmitted infection) Routine vaginal swab obtained - NuSwab Vaginitis Plus (VG+)  6. Routine cervical smear Routine Pap done, will call patient with results - IGP, Aptima HPV  7. Dysuria Routine urinalysis done - UA/M w/rflx Culture, Routine - Microscopic Examination - Urine Culture, Reflex  8. Encounter for prescription of oral contraceptives 1 year's worth of refills ordered - drospirenone-ethinyl estradiol (YAZ) 3-0.02 MG tablet; Take 1 tablet by mouth daily.  Dispense: 84 tablet; Refill: 3      General Counseling: Reshanda verbalizes understanding of the findings of todays visit and agrees with plan of treatment. I have discussed any further diagnostic evaluation that may be needed or ordered today. We also reviewed her medications today. she has been encouraged to call the office with any questions or concerns that should arise related to todays visit.    Orders Placed This Encounter  Procedures   Microscopic Examination   Urine Culture, Reflex   UA/M w/rflx Culture, Routine   NuSwab Vaginitis Plus (VG+)   B12 and Folate Panel   Iron, TIBC and Ferritin Panel    Meds ordered this encounter  Medications   drospirenone-ethinyl estradiol (YAZ) 3-0.02 MG tablet    Sig: Take 1 tablet  by mouth daily.    Dispense:  84 tablet    Refill:  3   Fe Cbn-Fe Gluc-FA-B12-C-DSS (FERRALET 90) 90-1 MG TABS    Sig: Take 1 tablet by mouth daily.    Dispense:  30 tablet    Refill:  5    Iron and B12 deficiency.   ondansetron (ZOFRAN-ODT) 4 MG disintegrating tablet    Sig: Take 1 tablet (4 mg total) by mouth every 8 (eight) hours as needed for nausea or vomiting.    Dispense:  20 tablet    Refill:  0    Return in about 3 months (around 08/15/2021) for F/U, Review labs/test iron studies and B12, Isaias Dowson PCP.   Total time spent:30 Minutes Time spent includes review of chart, medications, test results, and follow up plan with the patient.   [place] Controlled Substance Database was reviewed by me.  This patient was seen by Jonetta Osgood, FNP-C in collaboration with Dr. Clayborn Bigness as a part of collaborative care agreement.  Ilai Hiller R. Valetta Fuller, MSN, FNP-C Internal medicine

## 2021-05-19 LAB — IGP, APTIMA HPV: HPV Aptima: NEGATIVE

## 2021-05-20 ENCOUNTER — Other Ambulatory Visit: Payer: Self-pay | Admitting: Nurse Practitioner

## 2021-05-20 ENCOUNTER — Encounter: Payer: Self-pay | Admitting: Nurse Practitioner

## 2021-05-20 DIAGNOSIS — B9689 Other specified bacterial agents as the cause of diseases classified elsewhere: Secondary | ICD-10-CM

## 2021-05-20 LAB — NUSWAB VAGINITIS PLUS (VG+)
Candida albicans, NAA: NEGATIVE
Candida glabrata, NAA: NEGATIVE
Chlamydia trachomatis, NAA: NEGATIVE
Megasphaera 1: HIGH Score — AB
Neisseria gonorrhoeae, NAA: NEGATIVE
Trich vag by NAA: NEGATIVE

## 2021-05-20 MED ORDER — METRONIDAZOLE 500 MG PO TABS: 500.0000 mg | ORAL_TABLET | Freq: Two times a day (BID) | ORAL | 0 refills | Status: DC

## 2021-05-20 NOTE — Progress Notes (Signed)
Please call the patient and let her know that her pap was negative for intraepithelial lesion and/or malignancy and HPV testing was negative as well. Pap smear will be repeated in 5 years. Her Nuswab was negative for sexually transmitted infections and yeast. Nuswab was positive for bacterial vaginosis. This is not an STD. It is an overgrowth of bacteria and is treated with metronidazole, which has been ordered and sent to your pharmacy. Do not drink alcohol while taking this medication, they do not mix well.

## 2021-05-20 NOTE — Progress Notes (Signed)
Spoke with pt, provided results, informed her of bacterial vaginosis and the med sent to the pharmacy and to repeat pap in 5 years. Pt aware not to mix alcohol with this med.

## 2021-05-22 LAB — UA/M W/RFLX CULTURE, ROUTINE
Bilirubin, UA: NEGATIVE
Glucose, UA: NEGATIVE
Nitrite, UA: NEGATIVE
Protein,UA: NEGATIVE
Specific Gravity, UA: 1.024 (ref 1.005–1.030)
Urobilinogen, Ur: 0.2 mg/dL (ref 0.2–1.0)
pH, UA: 5.5 (ref 5.0–7.5)

## 2021-05-22 LAB — MICROSCOPIC EXAMINATION
Casts: NONE SEEN /lpf
Epithelial Cells (non renal): >10 /hpf — AB (ref 0–10)
WBC, UA: >30 /hpf — AB (ref 0–5)

## 2021-05-22 LAB — URINE CULTURE, REFLEX: Organism ID, Bacteria: NO GROWTH

## 2021-05-24 ENCOUNTER — Ambulatory Visit: Payer: 59 | Admitting: Licensed Clinical Social Worker

## 2021-06-01 ENCOUNTER — Encounter: Payer: Self-pay | Admitting: Nurse Practitioner

## 2021-06-09 ENCOUNTER — Ambulatory Visit (INDEPENDENT_AMBULATORY_CARE_PROVIDER_SITE_OTHER): Payer: Managed Care, Other (non HMO)

## 2021-06-09 ENCOUNTER — Other Ambulatory Visit: Payer: Self-pay

## 2021-06-09 DIAGNOSIS — E538 Deficiency of other specified B group vitamins: Secondary | ICD-10-CM

## 2021-06-09 MED ORDER — CYANOCOBALAMIN 1000 MCG/ML IJ SOLN
1000.0000 ug | Freq: Once | INTRAMUSCULAR | Status: AC
  Administered 2021-06-09: 1000 ug via INTRAMUSCULAR

## 2021-06-14 ENCOUNTER — Other Ambulatory Visit: Payer: Self-pay | Admitting: Dermatology

## 2021-06-14 DIAGNOSIS — L700 Acne vulgaris: Secondary | ICD-10-CM

## 2021-06-26 ENCOUNTER — Other Ambulatory Visit: Payer: Self-pay | Admitting: Dermatology

## 2021-06-27 ENCOUNTER — Other Ambulatory Visit: Payer: Self-pay | Admitting: Dermatology

## 2021-06-30 ENCOUNTER — Ambulatory Visit: Payer: Managed Care, Other (non HMO) | Admitting: Dermatology

## 2021-06-30 ENCOUNTER — Other Ambulatory Visit: Payer: Self-pay

## 2021-06-30 DIAGNOSIS — R21 Rash and other nonspecific skin eruption: Secondary | ICD-10-CM | POA: Diagnosis not present

## 2021-06-30 DIAGNOSIS — L700 Acne vulgaris: Secondary | ICD-10-CM | POA: Diagnosis not present

## 2021-06-30 MED ORDER — SPIRONOLACTONE 100 MG PO TABS: 100.0000 mg | ORAL_TABLET | Freq: Every day | ORAL | 3 refills | Status: DC

## 2021-06-30 MED ORDER — TRIAMCINOLONE ACETONIDE 0.1 % EX CREA: 1.0000 "application " | TOPICAL_CREAM | Freq: Two times a day (BID) | CUTANEOUS | 0 refills | Status: DC | PRN

## 2021-06-30 MED ORDER — AMZEEQ 4 % EX FOAM: CUTANEOUS | 3 refills | Status: DC

## 2021-06-30 NOTE — Progress Notes (Signed)
Entered in error

## 2021-06-30 NOTE — Progress Notes (Signed)
Follow-Up Visit   Subjective  Ebony Lawson is a 36 y.o. female who presents for the following: Acne (Patient here today for 6 month acne follow up. Patient currently using adapalene 0.3 % gel, Amzeeq 4 % foam, spironolactone 100 mg, panoxyl. Patient advises acne is improved. ).  Patient is having some dry skin at neck she would like to talk about.   The following portions of the chart were reviewed this encounter and updated as appropriate:   Tobacco   Allergies   Meds   Problems   Med Hx   Surg Hx   Fam Hx       Review of Systems:  No other skin or systemic complaints except as noted in HPI or Assessment and Plan.  Objective  Well appearing patient in no apparent distress; mood and affect are within normal limits.  A focused examination was performed including face, neck, chest and back. Relevant physical exam findings are noted in the Assessment and Plan.  face Face with rare open comedone, neck with few inflammatory papules Chest is clear Back with few small inflammatory papules  Neck Erythematous rough patches    Assessment & Plan  Acne vulgaris face  Chronic condition with duration or expected duration over one year. Currently well-controlled but with side effect of treatment (xerosis with dermatitis).  Continue adapalene 0.3% gel at bedtime Continue Amzeeq at bedtime following adapalene, ok to use at neck. Continue spironolactone 100 mg once daily BP 111/71 Recommend switching from PanOxyl to Cln acne wash at bedtime to help with dryness. Can skip washing face in the morning.   Spironolactone can cause increased urination and cause blood pressure to decrease. Please watch for signs of lightheadedness and be cautious when changing position. It can sometimes cause breast tenderness or an irregular period in premenopausal women. It can also increase potassium. The increase in potassium usually is not a concern unless you are taking other medicines that also increase  potassium, so please be sure your doctor knows all of the other medications you are taking. This medication should not be taken by pregnant women.  This medicine should also not be taken together with sulfa drugs like Bactrim (trimethoprim/sulfamethexazole).    spironolactone (ALDACTONE) 100 MG tablet - face Take 1 tablet (100 mg total) by mouth daily.  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 Avoid applying to face, groin, and axilla. Use as directed. Long-term use can cause thinning of the skin.  Topical steroids (such as triamcinolone, fluocinolone, fluocinonide, mometasone, clobetasol, halobetasol, betamethasone, hydrocortisone) can cause thinning and lightening of the skin if they are used for too long in the same area. Your physician has selected the right strength medicine for your problem and area affected on the body. Please use your medication only as directed by your physician to prevent side effects.   Gentle skin care reviewed. Recommend d/c panoxyl and switch to Cln wash   triamcinolone cream (KENALOG) 0.1 % - Neck Apply 1 application topically 2 (two) times daily as needed. For up to 2 weeks as needed for rash. Avoid applying to face, groin, and axilla. Use as directed. Long-term use can cause thinning of the skin.   Return in about 1 year (around 06/30/2022) for acne.  Graciella Belton, RMA, am acting as scribe for Forest Gleason, MD .  Documentation: I have reviewed the above documentation for accuracy and completeness, and I agree with the above.  Forest Gleason, MD

## 2021-06-30 NOTE — Patient Instructions (Addendum)
Gentle Skin Care Guide  1. Bathe no more than once a day.  2. Avoid bathing in hot water  3. Use a mild soap like Dove, Vanicream, Cetaphil, CeraVe.   4. Use soap only where you need it. On most days, use it under your arms, between your legs, and on your feet. Let the water rinse other areas unless visibly dirty.  5. When you get out of the bath/shower, use a towel to gently blot your skin dry, don't rub it.  6. While your skin is still a little damp, apply a moisturizing cream such as Vanicream, CeraVe, Cetaphil, Eucerin. For hands apply Neutrogena [place] Hand Cream or Excipial Hand Cream.  7. Reapply moisturizer any time you start to itch or feel dry.  8. Sometimes using free and clear laundry detergents can be helpful. Fabric softener sheets should be avoided. Downy Free & Gentle liquid, or any liquid fabric softener that is free of dyes and perfumes, it acceptable to use  9. If your doctor has given you prescription creams you may apply moisturizers over them   Recommend switching from PanOxyl to Cln acne wash at bedtime to help with dryness. Can skip washing face in the morning.   Spironolactone can cause increased urination and cause blood pressure to decrease. Please watch for signs of lightheadedness and be cautious when changing position. It can sometimes cause breast tenderness or an irregular period in premenopausal women. It can also increase potassium. The increase in potassium usually is not a concern unless you are taking other medicines that also increase potassium, so please be sure your doctor knows all of the other medications you are taking. This medication should not be taken by pregnant women.  This medicine should also not be taken together with sulfa drugs like Bactrim (trimethoprim/sulfamethexazole).   If You Need Anything After Your Visit  If you have any questions or concerns for your doctor, please call our main line at [phone] and press option 4 to  reach your doctor's medical assistant. If no one answers, please leave a voicemail as directed and we will return your call as soon as possible. Messages left after 4 pm will be answered the following business day.   You may also send [place] a message via [place]. We typically respond to MyChart messages within 1-2 business days.  For prescription refills, please ask your pharmacy to contact our office. Our fax number is [phone].  If you have an urgent issue when the clinic is closed that cannot wait until the next business day, you can page your doctor at the number below.    Please note that while we do our best to be available for urgent issues outside of office hours, we are not available 24/7.   If you have an urgent issue and are unable to reach [place], you may choose to seek medical care at your doctor's office, retail clinic, urgent care center, or emergency room.  If you have a medical emergency, please immediately call 911 or go to the emergency department.  Pager Numbers  - Dr. Nehemiah Massed: [phone]  - Dr. Laurence Ferrari: [phone]  - Dr. Nicole Kindred: [phone]  In the event of inclement weather, please call our main line at [phone] for an update on the status of any delays or closures.  Dermatology Medication Tips: Please keep the boxes that topical medications come in in order to help keep track of the instructions about where and how to use these. Pharmacies typically print the medication instructions  only on the boxes and not directly on the medication tubes.   If your medication is too expensive, please contact our office at [phone] option 4 or send [place] a message through [place].   We are unable to tell what your co-pay for medications will be in advance as this is different depending on your insurance coverage. However, we may be able to find a substitute medication at lower cost or fill out paperwork to get insurance to cover a needed medication.   If a prior  authorization is required to get your medication covered by your insurance company, please allow [place] 1-2 business days to complete this process.  Drug prices often vary depending on where the prescription is filled and some pharmacies may offer cheaper prices.  The website www.goodrx.com contains coupons for medications through different pharmacies. The prices here do not account for what the cost may be with help from insurance (it may be cheaper with your insurance), but the website can give you the price if you did not use any insurance.  - You can print the associated coupon and take it with your prescription to the pharmacy.  - You may also stop by our office during regular business hours and pick up a GoodRx coupon card.  - If you need your prescription sent electronically to a different pharmacy, notify our office through [facility] or by phone at [phone] option 4.     Si Usted Necesita Algo Despus de Su Visita  Tambin puede enviarnos un mensaje a travs de Pharmacist, community. Por lo general respondemos a los mensajes de MyChart en el transcurso de 1 a 2 das hbiles.  Para renovar recetas, por favor pida a su farmacia que se ponga en contacto con nuestra oficina. Harland Dingwall de fax es [place] [phone].  Si tiene un asunto urgente cuando la clnica est cerrada y que no puede esperar hasta el siguiente da hbil, puede llamar/localizar a su doctor(a) al nmero que aparece a continuacin.   Por favor, tenga en cuenta que aunque hacemos todo lo posible para estar disponibles para asuntos urgentes fuera del horario de [place], no estamos disponibles las 24 horas del da, los 7 das de la [place].   Si tiene un problema urgente y no puede comunicarse con nosotros, puede optar por buscar atencin mdica  en el consultorio de su doctor(a), en una clnica privada, en un centro de atencin urgente o en una sala de emergencias.  Si tiene Engineering geologist, por favor llame  inmediatamente al 911 o vaya a la sala de emergencias.  Nmeros de bper  - Dr. Nehemiah Massed: [phone]  - Dra. Moye: [phone]  - Dra. Nicole Kindred: [phone]  En caso de inclemencias del [place], por favor llame a Johnsie Kindred principal al [phone] para una actualizacin sobre el [place] de cualquier retraso o cierre.  Consejos para la medicacin en dermatologa: Por favor, guarde las cajas en las que vienen los medicamentos de uso tpico para ayudarle a seguir las instrucciones sobre dnde y cmo usarlos. Las farmacias generalmente imprimen las instrucciones del medicamento slo en las cajas y no directamente en los tubos del [place].   Si su medicamento es muy caro, por favor, pngase en contacto con Zigmund Daniel llamando al [phone] y presione la opcin 4 o envenos un mensaje a travs de Pharmacist, community.   No podemos decirle cul ser su copago por los medicamentos por adelantado ya que esto es diferente dependiendo de la cobertura de su seguro. Sin embargo, es  posible que podamos encontrar un medicamento sustituto a Electrical engineer un formulario para que el seguro cubra el medicamento que se considera necesario.   Si se requiere una autorizacin previa para que su compaa de seguros [place] su medicamento, por favor permtanos de 1 a 2 das hbiles para completar este proceso.  Los precios de los medicamentos varan con frecuencia dependiendo del Environmental consultant de dnde se surte la receta y alguna farmacias pueden ofrecer precios ms baratos.  El sitio web www.goodrx.com tiene cupones para medicamentos de Airline pilot. Los precios aqu no tienen en cuenta lo que podra costar con la ayuda del seguro (puede ser ms barato con su seguro), pero el sitio web puede darle el precio si no utiliz Research scientist (physical sciences).  - Puede imprimir el cupn correspondiente y llevarlo con su receta a la farmacia.  - Tambin puede pasar por nuestra oficina durante el horario de atencin regular y Charity fundraiser  una tarjeta de cupones de GoodRx.  - Si necesita que su receta se enve electrnicamente a una farmacia diferente, informe a nuestra oficina a travs de MyChart de [place] o por telfono llamando al [phone] y presione la opcin 4.  Start triamcinolone 0.1% cream twice daily for up to 2 weeks as needed. Avoid applying to face, groin, and axilla. Use as directed. Long-term use can cause thinning of the skin.  Topical steroids (such as triamcinolone, fluocinolone, fluocinonide, mometasone, clobetasol, halobetasol, betamethasone, hydrocortisone) can cause thinning and lightening of the skin if they are used for too long in the same area. Your physician has selected the right strength medicine for your problem and area affected on the body. Please use your medication only as directed by your physician to prevent side effects.

## 2021-07-01 ENCOUNTER — Encounter: Payer: Self-pay | Admitting: Dermatology

## 2021-07-01 ENCOUNTER — Ambulatory Visit (INDEPENDENT_AMBULATORY_CARE_PROVIDER_SITE_OTHER): Payer: 59 | Admitting: Licensed Clinical Social Worker

## 2021-07-01 DIAGNOSIS — F411 Generalized anxiety disorder: Secondary | ICD-10-CM | POA: Diagnosis not present

## 2021-07-01 NOTE — Plan of Care (Signed)
°  Problem: Anxiety Disorder CCP Problem  1 Reduce overall frequency, intensity, and duration of the anxiety so that daily functioning is not impaired per pt self report 3 out of 5 sessions documented.   Goal: LTG: Patient will score less than 5 on the Generalized Anxiety Disorder 7 Scale (GAD-7) Outcome: Progressing Goal: STG: Patient will participate in at least 80% of scheduled individual psychotherapy sessions Outcome: Progressing   

## 2021-07-01 NOTE — Plan of Care (Signed)
°  Problem: Anxiety Disorder CCP Problem  1 Reduce overall frequency, intensity, and duration of the anxiety so that daily functioning is not impaired per pt self report 3 out of 5 sessions documented.   Goal: LTG: Patient will score less than 5 on the Generalized Anxiety Disorder 7 Scale (GAD-7) Outcome: Progressing Goal: STG: Patient will participate in at least 80% of scheduled individual psychotherapy sessions Outcome: Progressing Intervention: Encourage patient to identify triggers Intervention: Assist with relaxation techniques, as appropriate (deep breathing exercises, meditation, guided imagery) Intervention: Encourage self-care activities

## 2021-07-01 NOTE — Progress Notes (Signed)
Virtual Visit via Video Note  I connected with Ebony Lawson on 07/01/21 at  8:00 AM EST by a video enabled telemedicine application and verified that I am speaking with the correct person using two identifiers.  Location: Patient: home Provider: remote office [place], [place])   I discussed the limitations of evaluation and management by telemedicine and the availability of in person appointments. The patient expressed understanding and agreed to proceed.   I discussed the assessment and treatment plan with the patient. The patient was provided an opportunity to ask questions and all were answered. The patient agreed with the plan and demonstrated an understanding of the instructions.   The patient was advised to call back or seek an in-person evaluation if the symptoms worsen or if the condition fails to improve as anticipated.  I provided 50 minutes of non-face-to-face time during this encounter.   Cherry Log, LCSW   THERAPIST PROGRESS NOTE  Session Time: 8:10-9a  Participation Level: Active  Behavioral Response: Neat and Well GroomedAlertAnxious  Type of Therapy: Individual Therapy  Treatment Goals addressed:  Problem: Anxiety Disorder CCP Problem  1 Reduce overall frequency, intensity, and duration of the anxiety so that daily functioning is not impaired per pt self report 3 out of 5 sessions documented.    Goal: LTG: Patient will score less than 5 on the Generalized Anxiety Disorder 7 Scale (GAD-7) Outcome: Progressing  Goal: STG: Patient will participate in at least 80% of scheduled individual psychotherapy sessions Outcome: Progressing  Interventions:  Intervention: Encourage patient to identify triggers  Intervention: Assist with relaxation techniques, as appropriate (deep breathing exercises, meditation, guided imagery)  Intervention: Encourage self-care activities  Summary: Ebony Lawson is a 36 y.o. female who presents with improving symptoms  related to anxiety diagnosis. Pt reports that overall mood has been stable. Pt reporting good quality and quantity of sleep.  Allowed pt to explore and express thoughts and feelings associated with recent life situations and external stressors. Discussed stress associated with pts cat--pt worried about health of cat.   Discussed drawbacks of researching things online.   Explored relationships with parents, work Medical laboratory scientific officer, and boyfriend. Pt reports all relationships are going well at time of session.  Reviewed stress management and coping skills.   Continued recommendations are as follows: self care behaviors, positive social engagements, focusing on overall work/home/life balance, and focusing on positive physical and emotional wellness.  .  Suicidal/Homicidal: No  Therapist Response: Pt is continuing to apply interventions learned in session into daily life situations. Pt is currently on track to meet goals utilizing interventions mentioned above. Personal growth and progress noted. Treatment to continue as indicated.   Plan: Return again in 4 weeks.  Diagnosis: Axis I: GAD    Axis II: No diagnosis    Rachel Bo Ninfa Giannelli, LCSW 07/01/2021

## 2021-07-07 ENCOUNTER — Other Ambulatory Visit: Payer: Self-pay

## 2021-07-07 ENCOUNTER — Ambulatory Visit (INDEPENDENT_AMBULATORY_CARE_PROVIDER_SITE_OTHER): Payer: Managed Care, Other (non HMO)

## 2021-07-07 DIAGNOSIS — E538 Deficiency of other specified B group vitamins: Secondary | ICD-10-CM | POA: Diagnosis not present

## 2021-07-07 MED ORDER — CYANOCOBALAMIN 1000 MCG/ML IJ SOLN
1000.0000 ug | Freq: Once | INTRAMUSCULAR | Status: AC
  Administered 2021-07-07: 1000 ug via INTRAMUSCULAR

## 2021-08-04 ENCOUNTER — Ambulatory Visit: Payer: Managed Care, Other (non HMO)

## 2021-08-04 ENCOUNTER — Encounter: Payer: Self-pay | Admitting: Nurse Practitioner

## 2021-08-04 ENCOUNTER — Other Ambulatory Visit: Payer: Self-pay

## 2021-08-04 DIAGNOSIS — D508 Other iron deficiency anemias: Secondary | ICD-10-CM

## 2021-08-04 DIAGNOSIS — E538 Deficiency of other specified B group vitamins: Secondary | ICD-10-CM

## 2021-08-04 DIAGNOSIS — E559 Vitamin D deficiency, unspecified: Secondary | ICD-10-CM

## 2021-08-04 DIAGNOSIS — E782 Mixed hyperlipidemia: Secondary | ICD-10-CM

## 2021-08-04 MED ORDER — CYANOCOBALAMIN 1000 MCG/ML IJ SOLN
1000.0000 ug | Freq: Once | INTRAMUSCULAR | Status: AC
  Administered 2021-08-04: 1000 ug via INTRAMUSCULAR

## 2021-08-08 ENCOUNTER — Other Ambulatory Visit: Payer: Self-pay

## 2021-08-08 ENCOUNTER — Ambulatory Visit (INDEPENDENT_AMBULATORY_CARE_PROVIDER_SITE_OTHER): Payer: 59 | Admitting: Licensed Clinical Social Worker

## 2021-08-08 DIAGNOSIS — F411 Generalized anxiety disorder: Secondary | ICD-10-CM | POA: Diagnosis not present

## 2021-08-08 NOTE — Plan of Care (Signed)
?  Problem: Anxiety Disorder CCP Problem  1 Reduce overall frequency, intensity, and duration of the anxiety so that daily functioning is not impaired per pt self report 3 out of 5 sessions documented.   ?Goal: LTG: Patient will score less than 5 on the Generalized Anxiety Disorder 7 Scale (GAD-7) ?Outcome: Not Progressing ?Goal: STG: Patient will participate in at least 80% of scheduled individual psychotherapy sessions ?Outcome: Progressing ?Intervention: Encourage verbalization of problems out of their control ?Intervention: Assist with relaxation techniques, as appropriate (deep breathing exercises, meditation, guided imagery) ?Intervention: Encourage patient to identify triggers ?  ?

## 2021-08-08 NOTE — Progress Notes (Signed)
Virtual Visit via Video Note ? ?I connected with Ebony Lawson on 08/08/21 at  4:00 PM EST by a video enabled telemedicine application and verified that I am speaking with the correct person using two identifiers. ? ?Location: ?Patient: home ?Provider: remote office [place], [place]) ?  ?I discussed the limitations of evaluation and management by telemedicine and the availability of in person appointments. The patient expressed understanding and agreed to proceed. ? ?I discussed the assessment and treatment plan with the patient. The patient was provided an opportunity to ask questions and all were answered. The patient agreed with the plan and demonstrated an understanding of the instructions. ?  ?The patient was advised to call back or seek an in-person evaluation if the symptoms worsen or if the condition fails to improve as anticipated. ? ?I provided 60 minutes of non-face-to-face time during this encounter. ? ? ?Raney Koeppen R Marki Frede, LCSW ? ? ?THERAPIST PROGRESS NOTE ? ?Session Time: 4-5p ? ?Participation Level: Active ? ?Behavioral Response: Neat and Well GroomedAlertAnxious ? ?Type of Therapy: Individual Therapy ? ?Treatment Goals addressed: Problem: Anxiety Disorder CCP Problem  1 Reduce overall frequency, intensity, and duration of the anxiety so that daily functioning is not impaired per pt self report 3 out of 5 sessions documented.   ?Goal: LTG: Patient will score less than 5 on the Generalized Anxiety Disorder 7 Scale (GAD-7) ?Outcome: Not Progressing ?Goal: STG: Patient will participate in at least 80% of scheduled individual psychotherapy sessions ?Outcome: Progressing ? ?ProgressTowards Goals: Not Progressing ? ?Interventions: CBT ? ?Summary: Ebony Lawson is a 36 y.o. female who presents with continuing symptoms related to anxiety disorder. Pt reports that she is compliant with her medication and that she is managing some situational stressors well and others she feels escalate into "anxiety  episodes" where she feels an escalation in anxiety along with rapid heartbeat.  ? ?Reviewed ways of managing the physiological symptoms of anxiety in the moment using CBT. Encouraged pt to pick apart thoughts--irrational thoughts and spiraling thoughts--and label them as thoughts and not facts. Discussed expectations vs reality and how whenever reality differs from expectation it can trigger anxiety, fear, anger, irritability, uncertainty. Pt will be more self aware in anxious situations and try to use her rational brain to think things through and not the emotional brain.  Pt reports relationships with friends and family are going well. Pt reports that she does feel she projects her anxiety onto situations like worrying about her parents or worrying about her cat.  ? ?Continued recommendations are as follows: self care behaviors, positive social engagements, focusing on overall work/home/life balance, and focusing on positive physical and emotional wellness.  ? ?Suicidal/Homicidal: No ? ?Therapist Response: Pt is continuing to apply interventions learned in session into daily life situations. Pt is currently on track to meet goals utilizing interventions mentioned above. Personal growth and progress fluctuating/intermittent at time of session. Treatment to continue as indicated.  ? ?Plan: Return again in 4 weeks. ? ?Diagnosis: GAD (generalized anxiety disorder) ? ?Collaboration of Care: Other pt encouraged to continue follow ups with psychiatrist of record, Dr. Shea Evans ? ?Patient/Guardian was advised Release of Information must be obtained prior to any record release in order to collaborate their care with an outside provider. Patient/Guardian was advised if they have not already done so to contact the registration department to sign all necessary forms in order for [place] to release information regarding their care.  ? ?Consent: Patient/Guardian gives verbal consent for treatment and assignment of benefits for  services  provided during this visit. Patient/Guardian expressed understanding and agreed to proceed.  ? ?Granville Lewis, LCSW ?08/08/2021 ? ?

## 2021-08-09 ENCOUNTER — Telehealth: Payer: Self-pay

## 2021-08-09 NOTE — Telephone Encounter (Signed)
Pt advised that we ordered labs [facility] go to [facility]  ?

## 2021-08-11 LAB — CBC WITH DIFFERENTIAL/PLATELET
Basophils Absolute: 0 10*3/uL (ref 0.0–0.2)
Basos: 0 %
EOS (ABSOLUTE): 0.1 10*3/uL (ref 0.0–0.4)
Eos: 1 %
Hematocrit: 42.3 % (ref 34.0–46.6)
Hemoglobin: 14.3 g/dL (ref 11.1–15.9)
Immature Grans (Abs): 0 10*3/uL (ref 0.0–0.1)
Immature Granulocytes: 0 %
Lymphocytes Absolute: 1.7 10*3/uL (ref 0.7–3.1)
Lymphs: 23 %
MCH: 28.2 pg (ref 26.6–33.0)
MCHC: 33.8 g/dL (ref 31.5–35.7)
MCV: 83 fL (ref 79–97)
Monocytes Absolute: 0.4 10*3/uL (ref 0.1–0.9)
Monocytes: 5 %
Neutrophils Absolute: 5.2 10*3/uL (ref 1.4–7.0)
Neutrophils: 71 %
Platelets: 211 10*3/uL (ref 150–450)
RBC: 5.07 x10E6/uL (ref 3.77–5.28)
RDW: 12.8 % (ref 11.7–15.4)
WBC: 7.5 10*3/uL (ref 3.4–10.8)

## 2021-08-11 LAB — LIPID PANEL
Chol/HDL Ratio: 3.1 ratio (ref 0.0–4.4)
Cholesterol, Total: 182 mg/dL (ref 100–199)
HDL: 59 mg/dL (ref >39)
LDL Chol Calc (NIH): 106 mg/dL — ABNORMAL HIGH (ref 0–99)
Triglycerides: 91 mg/dL (ref 0–149)
VLDL Cholesterol Cal: 17 mg/dL (ref 5–40)

## 2021-08-11 LAB — B12 AND FOLATE PANEL
Folate: 19.1 ng/mL (ref >3.0)
Vitamin B-12: 890 pg/mL (ref 232–1245)

## 2021-08-11 LAB — IRON,TIBC AND FERRITIN PANEL
Ferritin: 46 ng/mL (ref 15–150)
Iron Saturation: 38 % (ref 15–55)
Iron: 125 ug/dL (ref 27–159)
Total Iron Binding Capacity: 333 ug/dL (ref 250–450)
UIBC: 208 ug/dL (ref 131–425)

## 2021-08-11 LAB — VITAMIN D 25 HYDROXY (VIT D DEFICIENCY, FRACTURES): Vit D, 25-Hydroxy: 43.4 ng/mL (ref 30.0–100.0)

## 2021-08-16 ENCOUNTER — Other Ambulatory Visit: Payer: Self-pay

## 2021-08-16 ENCOUNTER — Ambulatory Visit: Payer: Managed Care, Other (non HMO) | Admitting: Nurse Practitioner

## 2021-08-16 ENCOUNTER — Encounter: Payer: Self-pay | Admitting: Nurse Practitioner

## 2021-08-16 VITALS — BP 118/64 | HR 80 | Temp 98.7°F | Resp 16 | Ht 60.0 in | Wt 107.0 lb

## 2021-08-16 DIAGNOSIS — Z23 Encounter for immunization: Secondary | ICD-10-CM

## 2021-08-16 DIAGNOSIS — N6489 Other specified disorders of breast: Secondary | ICD-10-CM

## 2021-08-16 DIAGNOSIS — D508 Other iron deficiency anemias: Secondary | ICD-10-CM

## 2021-08-16 DIAGNOSIS — E538 Deficiency of other specified B group vitamins: Secondary | ICD-10-CM | POA: Diagnosis not present

## 2021-08-16 DIAGNOSIS — J0110 Acute frontal sinusitis, unspecified: Secondary | ICD-10-CM | POA: Diagnosis not present

## 2021-08-16 DIAGNOSIS — E559 Vitamin D deficiency, unspecified: Secondary | ICD-10-CM

## 2021-08-16 DIAGNOSIS — E782 Mixed hyperlipidemia: Secondary | ICD-10-CM

## 2021-08-16 MED ORDER — TETANUS-DIPHTH-ACELL PERTUSSIS 5-2.5-18.5 LF-MCG/0.5 IM SUSP: 0.5000 mL | Freq: Once | INTRAMUSCULAR | 0 refills | Status: AC

## 2021-08-16 MED ORDER — HYDROCOD POLI-CHLORPHE POLI ER 10-8 MG/5ML PO SUER: 5.0000 mL | Freq: Two times a day (BID) | ORAL | 0 refills | Status: DC | PRN

## 2021-08-16 MED ORDER — AZITHROMYCIN 250 MG PO TABS: ORAL_TABLET | ORAL | 0 refills | Status: AC

## 2021-08-16 NOTE — Progress Notes (Signed)
[facility] ?[address] ?[place], [place] [address] ? ?Internal MEDICINE  ?Office Visit Note ? ?Patient Name: Ebony Lawson ? [id number]  ?[id number] ? ?Date of Service: 08/16/2021 ? ?Chief Complaint  ?Patient presents with  ? Follow-up  ?  Pt has cold, scratchy throat, drainage, congestion, cough  ? Results  ? Sore Throat  ? Sinusitis  ? Cough  ? ? ?HPI ?Verlie presents for a follow-up visit to review repeat labs.  Patient is also experiencing symptoms of sinusitis including scratchy sore throat, sinus drainage, sinus pressure, nasal congestion, cough.  Her symptoms started last Thursday night and have not improved or resolved with over-the-counter medications.  She reports ports that the cough keeps her up at night. ?Reviewed labs with patient.  Her vitamin D level is normal still.  Her iron panel is within normal limits ferritin is much improved at 46.  Vitamin B12 is 890 and folate level is 19.  Her lipid panel is significantly improved.  Her total cholesterol is now within normal limits and her LDL has improved and is at 106.  Her LDL was 121 3 months ago.  Her cholesterol/HDL ratio is 3.1 which is less than half the average risk of developing heart disease.  Her CBC is normal, no anemia noted. ?Patient wants to stop taking iron supplement. ? ?Current Medication: ?Outpatient Encounter Medications as of 08/16/2021  ?Medication Sig  ? Adapalene (DIFFERIN) 0.3 % gel Apply pea size amount to entire face once every night  ? azithromycin (ZITHROMAX) 250 MG tablet Take 2 tablets on day 1, then 1 tablet daily on days 2 through 5  ? Calcium Carbonate-Vitamin D (CALCIUM 600+D3 PO) Take 1 tablet by mouth daily.  ? chlorpheniramine-HYDROcodone (TUSSIONEX PENNKINETIC ER) 10-8 MG/5ML Take 5 mLs by mouth every 12 (twelve) hours as needed for cough.  ? drospirenone-ethinyl estradiol (YAZ) 3-0.02 MG tablet Take 1 tablet by mouth daily.  ? Fe Cbn-Fe Gluc-FA-B12-C-DSS (FERRALET 90) 90-1 MG TABS Take 1 tablet by mouth  daily.  ? hydrOXYzine (ATARAX/VISTARIL) 10 MG tablet Take 1-2 tablets (10-20 mg total) by mouth 2 (two) times daily as needed. For severe anxiety  ? meclizine (ANTIVERT) 12.5 MG tablet Take 1 tablet (12.5 mg total) by mouth 3 (three) times daily as needed for dizziness or nausea.  ? Minocycline HCl Micronized (AMZEEQ) 4 % FOAM Apply daily as directed  ? ondansetron (ZOFRAN-ODT) 4 MG disintegrating tablet Take 1 tablet (4 mg total) by mouth every 8 (eight) hours as needed for nausea or vomiting.  ? Polypodium Leucotomos (HELIOCARE PO) Take by mouth.  ? spironolactone (ALDACTONE) 100 MG tablet TAKE 1 TABLET(100 MG) BY MOUTH DAILY  ? spironolactone (ALDACTONE) 100 MG tablet Take 1 tablet (100 mg total) by mouth daily.  ? triamcinolone cream (KENALOG) 0.1 % Apply 1 application topically 2 (two) times daily as needed. For up to 2 weeks as needed for rash. Avoid applying to face, groin, and axilla. Use as directed. Long-term use can cause thinning of the skin.  ? VITAMIN D PO Take by mouth.  ? [DISCONTINUED] metroNIDAZOLE (FLAGYL) 500 MG tablet Take 1 tablet (500 mg total) by mouth 2 (two) times daily.  ? [DISCONTINUED] Tdap (BOOSTRIX) 5-2.5-18.5 LF-MCG/0.5 injection Inject 0.5 mLs into the muscle once.  ? Tdap (BOOSTRIX) 5-2.5-18.5 LF-MCG/0.5 injection Inject 0.5 mLs into the muscle once for 1 dose.  ? ?No facility-administered encounter medications on file as of 08/16/2021.  ? ? ?Surgical History: ?History reviewed. No pertinent surgical history. ? ?Medical  History: ?Past Medical History:  ?Diagnosis Date  ? Heart murmur   ? Migraine   ? ? ?Family History: ?Family History  ?Problem Relation Age of Onset  ? Hyperlipidemia Mother   ? Cancer Father   ?     prostate and skin cancer  ? Hyperlipidemia Father   ? Heart disease Father   ?     CAD - triple bypass 2014  ? Hypertension Father   ? Hearing loss Paternal Grandfather   ? Hypertension Paternal Grandfather   ? Anxiety disorder Brother   ? ? ?Social History   ? ?Socioeconomic History  ? Marital status: Single  ?  Spouse name: Not on file  ? Number of children: 0  ? Years of education: 67  ? Highest education level: Bachelor's degree (e.g., BA, AB, BS)  ?Occupational History  ? Occupation: DNA Product/process development scientist  ?  Employer: [employer]  ?Tobacco Use  ? Smoking status: Never  ? Smokeless tobacco: Never  ?Vaping Use  ? Vaping Use: Never used  ?Substance and Sexual Activity  ? Alcohol use: Yes  ?  Alcohol/week: 0.0 standard drinks  ?  Comment: 2-3 drinks monthly  ? Drug use: No  ? Sexual activity: Yes  ?  Partners: Male  ?  Birth control/protection: OCP  ?  Comment: 1 partner   ?Other Topics Concern  ? Not on file  ?Social History Narrative  ? Sheronda grew up outside of [facility]. She attended [employer] and obtained a Manufacturing engineer in [employer] in [place]. 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   ?   ? [place] - plays trumpet  ? Exercise - none at present  ? Caffeine - rare   ? ?Social Determinants of Health  ? ?Financial Resource Strain: Not on file  ?Food Insecurity: Not on file  ?Transportation Needs: Not on file  ?Physical Activity: Not on file  ?Stress: Not on file  ?Social Connections: Not on file  ?Intimate Partner Violence: Not on file  ? ? ? ? ?Review of Systems  ?Constitutional:  Positive for fatigue. Negative for chills and fever.  ?HENT:  Positive for congestion, postnasal drip, rhinorrhea, sinus pressure, sinus pain, sneezing and sore throat. Negative for ear pain.   ?Respiratory:  Positive for cough. Negative for chest tightness, shortness of breath and wheezing.   ?Cardiovascular: Negative.  Negative for chest pain and palpitations.  ?Gastrointestinal: Negative.  Negative for abdominal distention, constipation, diarrhea, nausea and vomiting.  ?Musculoskeletal: Negative.   ?Neurological:  Positive for headaches.  ? ?Vital Signs: ?BP 118/64   Pulse 80   Temp 98.7 ?F (37.1 ?C)   Resp 16   Ht 5' (1.524 m)   Wt 107 lb (48.5 kg)   SpO2 99%   BMI 20.90  kg/m?  ? ? ?Physical Exam ?Vitals reviewed.  ?Constitutional:   ?   General: She is not in acute distress. ?   Appearance: She is well-developed and normal weight. She is not ill-appearing.  ?HENT:  ?   Head: Normocephalic and atraumatic.  ?   Right Ear: Tympanic membrane and ear canal normal.  ?   Left Ear: Tympanic membrane and ear canal normal.  ?   Nose: Congestion and rhinorrhea present.  ?   Mouth/Throat:  ?   Mouth: Mucous membranes are moist.  ?   Pharynx: Posterior oropharyngeal erythema present.  ?   Tonsils: No tonsillar exudate or tonsillar abscesses. 0 on the right. 0 on the  left.  ?Eyes:  ?   Conjunctiva/sclera: Conjunctivae normal.  ?   Pupils: Pupils are equal, round, and reactive to light.  ?Cardiovascular:  ?   Rate and Rhythm: Normal rate and regular rhythm.  ?   Heart sounds: Normal heart sounds. No murmur heard. ?Pulmonary:  ?   Effort: Pulmonary effort is normal. No respiratory distress.  ?   Breath sounds: Normal breath sounds. No wheezing.  ?Lymphadenopathy:  ?   Cervical: No cervical adenopathy.  ?Skin: ?   Capillary Refill: Capillary refill takes less than 2 seconds.  ?Neurological:  ?   Mental Status: She is alert and oriented to person, place, and time.  ?Psychiatric:     ?   Mood and Affect: Mood normal.     ?   Behavior: Behavior normal.  ? ? ? ? ? ?Assessment/Plan: ?1. Acute non-recurrent frontal sinusitis ?Empiric antibiotic treatment prescribed.  Medication for symptomatic relief of cough prescribed ?- azithromycin (ZITHROMAX) 250 MG tablet; Take 2 tablets on day 1, then 1 tablet daily on days 2 through 5  Dispense: 6 tablet; Refill: 0 ?- chlorpheniramine-HYDROcodone (TUSSIONEX PENNKINETIC ER) 10-8 MG/5ML; Take 5 mLs by mouth every 12 (twelve) hours as needed for cough.  Dispense: 140 mL; Refill: 0 ? ?2. Other iron deficiency anemia ?Current levels are stable.  Patient wants to stop taking iron supplement and this was OK'd by me.  We will repeat iron panel in 3 months. ?- Iron, TIBC  and Ferritin Panel ? ?3. Breast asymmetry in female ?Breast asymmetry noted on previous office visit for annual physical exam.  It was mentioned to patient to get diagnostic mammogram done.  She is requesting to have a [place]

## 2021-09-02 ENCOUNTER — Encounter: Payer: Self-pay | Admitting: Dermatology

## 2021-09-08 ENCOUNTER — Ambulatory Visit: Payer: Managed Care, Other (non HMO)

## 2021-09-08 DIAGNOSIS — E538 Deficiency of other specified B group vitamins: Secondary | ICD-10-CM | POA: Diagnosis not present

## 2021-09-08 MED ORDER — CYANOCOBALAMIN 1000 MCG/ML IJ SOLN
1000.0000 ug | Freq: Once | INTRAMUSCULAR | Status: AC
  Administered 2021-09-08: 1000 ug via INTRAMUSCULAR

## 2021-09-19 ENCOUNTER — Ambulatory Visit (INDEPENDENT_AMBULATORY_CARE_PROVIDER_SITE_OTHER): Payer: 59 | Admitting: Licensed Clinical Social Worker

## 2021-09-19 DIAGNOSIS — F411 Generalized anxiety disorder: Secondary | ICD-10-CM

## 2021-09-19 NOTE — Progress Notes (Signed)
Virtual Visit via Video Note ? ?I connected with Ebony Lawson on 09/19/21 at  4:00 PM EDT by a video enabled telemedicine application and verified that I am speaking with the correct person using two identifiers. ? ?Location: ?Patient: home ?Provider: remote office [place], [place]) ?  ?I discussed the limitations of evaluation and management by telemedicine and the availability of in person appointments. The patient expressed understanding and agreed to proceed. ? ?I discussed the assessment and treatment plan with the patient. The patient was provided an opportunity to ask questions and all were answered. The patient agreed with the plan and demonstrated an understanding of the instructions. ?  ?The patient was advised to call back or seek an in-person evaluation if the symptoms worsen or if the condition fails to improve as anticipated. ? ?I provided 60 minutes of non-face-to-face time during this encounter. ? ? ?Ashten Sarnowski R Kamari Buch, LCSW ? ? ?THERAPIST PROGRESS NOTE ? ?Session Time: 4-5p ? ?Participation Level: Active ? ?Behavioral Response: Neat and Well GroomedAlertAnxious ? ?Type of Therapy: Individual Therapy ? ?Treatment Goals addressed: Problem: Anxiety Disorder CCP Problem  1 Reduce overall frequency, intensity, and duration of the anxiety so that daily functioning is not impaired per pt self report 3 out of 5 sessions documented.   ?Goal: LTG: Patient will score less than 5 on the Generalized Anxiety Disorder 7 Scale (GAD-7) ?Outcome: Progressing ?Goal: STG: Patient will participate in at least 80% of scheduled individual psychotherapy sessions ?Outcome: Progressing ?Intervention: Encourage verbalization of feelings/concerns/expectations ?Intervention: Encourage patient to identify triggers ?Intervention: Work with patient to identify the major components of a recent episode of anxiety: physical symptoms, major thoughts and images, and major behaviors they experienced ? ?ProgressTowards Goals:  Progressing ? ?Interventions: CBT ? ?Summary: [name] is a 36 y.o. female who presents with continuing symptoms related to anxiety disorder. Pt reports that she is compliant with her medication and that she is managing some situational stressors well and continuing to experience others she feels escalate into "anxiety episodes" where she feels an escalation in anxiety along with rapid heartbeat.  ? ?Allowed pt to explore and express thoughts and feelings associated with recent life situations and external stressors. Patient reports that she recently had several incidents that triggered stress involving concerns about the health of her cat. Patient reports that her cat got sick and vomited several times, which prompted a trip to the emergency vet. Patient reports that eventually her cat was OK, but the incident triggered a lot of stress. Allowed patient to explore ways that she managed her stress in the moment. Patient reports that these incidents occurred when she was traveling to visit her parents, and traveling to visit her boyfriend. Patient reports that her family and her boyfriend have both been very supportive, and were extremely helpful to her in these times. Encouraged pt to accept help and support from friends/family/loved ones. 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 Patient reports that she has noticed that her father will lose his place sometimes when he is playing in the band (reading music).  Patient reports that she tries hard to keep him in place and keeping up with the music. Patient reports that they play at old [place], and at local churches. Pt reports that she enjoys spending these moments with her father.  ? ?Discussed importance of self-care and social engagement. Patient reports that she enjoys taking her cat on a walk. Encourage patient to continue with her  walks even if her cat is unable to accompany her. Patient reports that she  will try. Explore patients relationship with boyfriend, and patient states that she is looking for another job that may support a move to [place], which is where her boyfriend lives. Patient reports that they spend most weekends together either in [place], or in [place]. ? ?Continued recommendations are as follows: self care behaviors, positive social engagements, focusing on overall work/home/life balance, and focusing on positive physical and emotional wellness.  ? ?Suicidal/Homicidal: No ? ?Therapist Response: Pt is continuing to apply interventions learned in session into daily life situations. Pt is currently on track to meet goals utilizing interventions mentioned above. Personal growth and progress fluctuating/intermittent at time of session. Treatment to continue as indicated.  ? ?Plan: Return again in 4 weeks. ? ?Diagnosis: GAD (generalized anxiety disorder) ? ?Collaboration of Care: Other pt encouraged to continue follow ups with psychiatrist of record, Dr. Shea Evans ? ?Patient/Guardian was advised Release of Information must be obtained prior to any record release in order to collaborate their care with an outside provider. Patient/Guardian was advised if they have not already done so to contact the registration department to sign all necessary forms in order for [place] to release information regarding their care.  ? ?Consent: Patient/Guardian gives verbal consent for treatment and assignment of benefits for services provided during this visit. Patient/Guardian expressed understanding and agreed to proceed.  ? ?Jatavia Keltner R Earma Nicolaou, LCSW ?09/19/2021 ? ?

## 2021-09-20 ENCOUNTER — Ambulatory Visit: Payer: 59 | Admitting: Licensed Clinical Social Worker

## 2021-09-20 NOTE — Plan of Care (Signed)
?  Problem: Anxiety Disorder CCP Problem  1 Reduce overall frequency, intensity, and duration of the anxiety so that daily functioning is not impaired per pt self report 3 out of 5 sessions documented.   ?Goal: LTG: Patient will score less than 5 on the Generalized Anxiety Disorder 7 Scale (GAD-7) ?Outcome: Progressing ?Goal: STG: Patient will participate in at least 80% of scheduled individual psychotherapy sessions ?Outcome: Progressing ?Intervention: Encourage verbalization of feelings/concerns/expectations ?Intervention: Encourage patient to identify triggers ?Intervention: Work with patient to identify the major components of a recent episode of anxiety: physical symptoms, major thoughts and images, and major behaviors they experienced ?  ?

## 2021-10-06 ENCOUNTER — Ambulatory Visit (INDEPENDENT_AMBULATORY_CARE_PROVIDER_SITE_OTHER): Payer: Managed Care, Other (non HMO)

## 2021-10-06 DIAGNOSIS — E538 Deficiency of other specified B group vitamins: Secondary | ICD-10-CM

## 2021-10-06 MED ORDER — CYANOCOBALAMIN 1000 MCG/ML IJ SOLN
1000.0000 ug | Freq: Once | INTRAMUSCULAR | Status: AC
  Administered 2021-10-06: 1000 ug via INTRAMUSCULAR

## 2021-11-03 ENCOUNTER — Ambulatory Visit: Payer: Managed Care, Other (non HMO)

## 2021-11-10 ENCOUNTER — Ambulatory Visit: Payer: 59 | Admitting: Licensed Clinical Social Worker

## 2021-11-15 ENCOUNTER — Ambulatory Visit: Payer: Managed Care, Other (non HMO) | Admitting: Nurse Practitioner

## 2021-11-17 LAB — IRON,TIBC AND FERRITIN PANEL
Ferritin: 39 ng/mL (ref 15–150)
Iron Saturation: 11 % — ABNORMAL LOW (ref 15–55)
Iron: 43 ug/dL (ref 27–159)
Total Iron Binding Capacity: 384 ug/dL (ref 250–450)
UIBC: 341 ug/dL (ref 131–425)

## 2021-11-24 ENCOUNTER — Ambulatory Visit: Payer: Managed Care, Other (non HMO) | Admitting: Nurse Practitioner

## 2021-11-24 ENCOUNTER — Encounter: Payer: Self-pay | Admitting: Nurse Practitioner

## 2021-11-24 VITALS — BP 111/70 | HR 80 | Temp 98.4°F | Resp 16 | Ht 60.0 in | Wt 107.2 lb

## 2021-11-24 DIAGNOSIS — D508 Other iron deficiency anemias: Secondary | ICD-10-CM | POA: Diagnosis not present

## 2021-11-24 DIAGNOSIS — E538 Deficiency of other specified B group vitamins: Secondary | ICD-10-CM

## 2021-11-24 DIAGNOSIS — Z3009 Encounter for other general counseling and advice on contraception: Secondary | ICD-10-CM

## 2021-11-24 MED ORDER — CYANOCOBALAMIN 1000 MCG/ML IJ SOLN
1000.0000 ug | Freq: Once | INTRAMUSCULAR | Status: AC
  Administered 2021-11-24: 1000 ug via INTRAMUSCULAR

## 2021-11-24 MED ORDER — FERRALET 90 90-1 MG PO TABS: 1.0000 | ORAL_TABLET | Freq: Every day | ORAL | 5 refills | Status: DC

## 2021-11-24 NOTE — Progress Notes (Unsigned)
[facility] [place], [place] [address]  Internal MEDICINE  Office Visit Note  Patient Name: Ebony Lawson  [id number]  [id number]  Date of Service: 11/24/2021  Chief Complaint  Patient presents with   Follow-up    Discuss meds     HPI Ebony Lawson presents for a follow-up visit for   Wts to try nexplanon, forgets to take ocp Iron level droped from 125 to 43 in 3 months, restart feralet.    Current Medication: Outpatient Encounter Medications as of 11/24/2021  Medication Sig   Adapalene (DIFFERIN) 0.3 % gel Apply pea size amount to entire face once every night   Calcium Carbonate-Vitamin D (CALCIUM 600+D3 PO) Take 1 tablet by mouth daily.   chlorpheniramine-HYDROcodone (TUSSIONEX PENNKINETIC ER) 10-8 MG/5ML Take 5 mLs by mouth every 12 (twelve) hours as needed for cough.   drospirenone-ethinyl estradiol (YAZ) 3-0.02 MG tablet Take 1 tablet by mouth daily.   hydrOXYzine (ATARAX/VISTARIL) 10 MG tablet Take 1-2 tablets (10-20 mg total) by mouth 2 (two) times daily as needed. For severe anxiety   meclizine (ANTIVERT) 12.5 MG tablet Take 1 tablet (12.5 mg total) by mouth 3 (three) times daily as needed for dizziness or nausea.   Minocycline HCl Micronized (AMZEEQ) 4 % FOAM Apply daily as directed   ondansetron (ZOFRAN-ODT) 4 MG disintegrating tablet Take 1 tablet (4 mg total) by mouth every 8 (eight) hours as needed for nausea or vomiting.   Polypodium Leucotomos (HELIOCARE PO) Take by mouth.   spironolactone (ALDACTONE) 100 MG tablet TAKE 1 TABLET(100 MG) BY MOUTH DAILY   spironolactone (ALDACTONE) 100 MG tablet Take 1 tablet (100 mg total) by mouth daily.   triamcinolone cream (KENALOG) 0.1 % Apply 1 application topically 2 (two) times daily as needed. For up to 2 weeks as needed for rash. Avoid applying to face, groin, and axilla. Use as directed. Long-term use can cause thinning of the skin.   VITAMIN D PO Take by mouth.   [DISCONTINUED] Fe Cbn-Fe  Gluc-FA-B12-C-DSS (FERRALET 90) 90-1 MG TABS Take 1 tablet by mouth daily.   Fe Cbn-Fe Gluc-FA-B12-C-DSS (FERRALET 90) 90-1 MG TABS Take 1 tablet by mouth daily.   Facility-Administered Encounter Medications as of 11/24/2021  Medication   cyanocobalamin ((VITAMIN B-12)) injection 1,000 mcg    Surgical History: History reviewed. No pertinent surgical history.  Medical History: Past Medical History:  Diagnosis Date   Heart murmur    Migraine     Family History: Family History  Problem Relation Age of Onset   Hyperlipidemia Mother    Cancer Father        prostate and skin cancer   Hyperlipidemia Father    Heart disease Father        CAD - triple bypass 2014   Hypertension Father    Hearing loss Paternal Grandfather    Hypertension Paternal Grandfather    Anxiety disorder Brother     Social History   Socioeconomic History   Marital status: Single    Spouse name: Not on file   Number of children: 0   Years of education: 16   Highest education level: Bachelor's degree (e.g., BA, AB, BS)  Occupational History   Occupation: DNA Animal nutritionist: [employer]  Tobacco Use   Smoking status: Never   Smokeless tobacco: Never  Vaping Use   Vaping Use: Never used  Substance and Sexual Activity   Alcohol use: Yes    Alcohol/week: 0.0 standard drinks of  alcohol    Comment: 2-3 drinks monthly   Drug use: No   Sexual activity: Yes    Partners: Male    Birth control/protection: OCP    Comment: 1 partner   Other Topics Concern   Not on file  Social History Narrative   [place] grew up outside of [place]. She attended [employer] and obtained a Manufacturing engineer in [employer] in [place]. 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   Exercise - none at present   Caffeine - rare    Social Determinants of Health   Financial Resource Strain: Low Risk  (02/15/2018)   Overall Financial Resource Strain (CARDIA)    Difficulty of Paying Living  Expenses: Not hard at all  Food Insecurity: No Food Insecurity (02/15/2018)   Hunger Vital Sign    Worried About Running Out of Food in the Last Year: Never true    Ran Out of Food in the Last Year: Never true  Transportation Needs: No Transportation Needs (02/15/2018)   PRAPARE - Hydrologist (Medical): No    Lack of Transportation (Non-Medical): No  Physical Activity: Sufficiently Active (02/15/2018)   Exercise Vital Sign    Days of Exercise per Week: 4 days    Minutes of Exercise per Session: 50 min  Stress: Stress Concern Present (02/15/2018)   [place]    Feeling of Stress : Very much  Social Connections: Unknown (02/15/2018)   Social Connection and Isolation Panel [NHANES]    Frequency of Communication with Friends and Family: Not on file    Frequency of Social Gatherings with Friends and Family: Not on file    Attends Religious Services: More than 4 times per year    Active Member of [employer] or Organizations: Yes    Attends Archivist Meetings: More than 4 times per year    Marital Status: Never married  Intimate Partner Violence: Not At Risk (02/15/2018)   Humiliation, Afraid, Rape, and Kick questionnaire    Fear of Current or Ex-Partner: No    Emotionally Abused: No    Physically Abused: No    Sexually Abused: No      Review of Systems  Vital Signs: BP 111/70   Pulse 80   Temp 98.4 F (36.9 C)   Resp 16   Ht 5' (1.524 m)   Wt 107 lb 3.2 oz (48.6 kg)   SpO2 99%   BMI 20.94 kg/m    Physical Exam     Assessment/Plan:   General Counseling: Ebony Lawson verbalizes understanding of the findings of todays visit and agrees with plan of treatment. I have discussed any further diagnostic evaluation that may be needed or ordered today. We also reviewed her medications today. she has been encouraged to call the office with any questions or concerns that should arise related  to todays visit.    Orders Placed This Encounter  Procedures   B12 and Folate Panel   Iron, TIBC and Ferritin Panel   CBC with Differential/Platelet   Ambulatory referral to Obstetrics / Gynecology    Meds ordered this encounter  Medications   Fe Cbn-Fe Gluc-FA-B12-C-DSS (FERRALET 90) 90-1 MG TABS    Sig: Take 1 tablet by mouth daily.    Dispense:  30 tablet    Refill:  5    Iron and B12 deficiency.   cyanocobalamin ((VITAMIN B-12)) injection  1,000 mcg    Return in about 6 months (around 05/26/2022) for F/U, Labs, med refill, Eshal Propps PCP.   Total time spent:*** Minutes Time spent includes review of chart, medications, test results, and follow up plan with the patient.   [place] Controlled Substance Database was reviewed by me.  This patient was seen by Jonetta Osgood, FNP-C in collaboration with Dr. Clayborn Bigness as a part of collaborative care agreement.   Michie Molnar R. Valetta Fuller, MSN, FNP-C Internal medicine

## 2021-11-25 ENCOUNTER — Encounter: Payer: Self-pay | Admitting: Nurse Practitioner

## 2021-12-20 ENCOUNTER — Encounter: Payer: Self-pay | Admitting: Family Medicine

## 2021-12-20 ENCOUNTER — Ambulatory Visit (INDEPENDENT_AMBULATORY_CARE_PROVIDER_SITE_OTHER): Payer: Managed Care, Other (non HMO) | Admitting: Family Medicine

## 2021-12-20 VITALS — BP 110/70 | HR 81 | Ht 60.0 in | Wt 107.0 lb

## 2021-12-20 DIAGNOSIS — R87820 Cervical low risk human papillomavirus (HPV) DNA test positive: Secondary | ICD-10-CM

## 2021-12-20 DIAGNOSIS — Z3009 Encounter for other general counseling and advice on contraception: Secondary | ICD-10-CM

## 2021-12-20 DIAGNOSIS — Z30017 Encounter for initial prescription of implantable subdermal contraceptive: Secondary | ICD-10-CM | POA: Diagnosis not present

## 2021-12-20 LAB — POCT URINE PREGNANCY: Preg Test, Ur: NEGATIVE

## 2021-12-20 MED ORDER — ETONOGESTREL 68 MG ~~LOC~~ IMPL
68.0000 mg | DRUG_IMPLANT | Freq: Once | SUBCUTANEOUS | Status: AC
  Administered 2021-12-20: 68 mg via SUBCUTANEOUS

## 2021-12-20 NOTE — Progress Notes (Signed)
    Contraception/Family Planning VISIT ENCOUNTER NOTE  Subjective:   Ebony Lawson is a 36 y.o. G0P0000  female here for reproductive life counseling.  Desires reliable from [facility].  Reports she does not want a pregnancy in the next year. Denies abnormal vaginal bleeding, discharge, pelvic pain, problems with intercourse or other gynecologic concerns.    Gynecologic History No LMP recorded. (Menstrual status: Oral contraceptives). Contraception: OCP (estrogen/progesterone)  There are no preventive care reminders to display for this patient.  The following portions of the patient's history were reviewed and updated as appropriate: allergies, current medications, past family history, past medical history, past social history, past surgical history and problem list.  Review of Systems Pertinent items are noted in HPI.   Objective:  BP 110/70 (Patient Position: Sitting, Cuff Size: Normal)   Pulse 81   Ht 5' (1.524 m)   Wt 107 lb (48.5 kg)   BMI 20.90 kg/m  Gen: well appearing, NAD HEENT: no scleral icterus CV: RR Lung: Normal WOB Ext: warm well perfused  Nexplanon Insertion Procedure Patient identified, informed consent performed, consent signed.   Patient does understand that irregular bleeding is a very common side effect of this medication. Appropriate time out taken.  Patient's left arm was prepped and draped in the usual sterile fashion.. The ruler used to measure and mark insertion area.  Patient was prepped with alcohol swab and then injected with 3 ml of 1% lidocaine.  She was prepped with betadine, Nexplanon removed from packaging,  Device confirmed in needle, then inserted full length of needle and withdrawn per handbook instructions. Nexplanon was able to palpated in the patient's arm; patient palpated the insert herself. There was minimal blood loss.  Patient insertion site covered with guaze and a pressure bandage to reduce any bruising.  The patient tolerated the procedure  well and was given post procedure instructions.   Assessment and Plan:   Contraception counseling: Reviewed all forms of birth control options available including abstinence; over the counter/barrier methods; hormonal contraceptive medication including pill, patch, ring, injection,contraceptive implant; hormonal and nonhormonal IUDs; permanent sterilization options including vasectomy and the various tubal sterilization modalities. Risks and benefits reviewed.  Questions were answered.  Written information was also given to the patient to review.  Patient desires Nexplanon- placed today. She will follow up in  1 yr  for surveillance.  She was told to call with any further questions, or with any concerns about this method of contraception.  Emphasized use of condoms 100% of the time for STI prevention.  1. Encounter for initial prescription of Nexplanon Placed nexplanon Recommended back up with condoms for 2 weeks  - POCT urine pregnancy    Please refer to After Visit Summary for other counseling recommendations.   Return in about 1 year (around 12/21/2022) for Yearly wellness exam.  Caren Macadam, MD, MPH, ABFM Attending [place] for [facility]

## 2021-12-27 ENCOUNTER — Encounter: Payer: Self-pay | Admitting: Family Medicine

## 2021-12-27 ENCOUNTER — Ambulatory Visit (INDEPENDENT_AMBULATORY_CARE_PROVIDER_SITE_OTHER): Payer: 59 | Admitting: Licensed Clinical Social Worker

## 2021-12-27 DIAGNOSIS — F411 Generalized anxiety disorder: Secondary | ICD-10-CM

## 2021-12-27 NOTE — Progress Notes (Signed)
Virtual Visit via Video Note  I connected with Ebony Lawson on 12/27/21 at  4:00 PM EDT by a video enabled telemedicine application and verified that I am speaking with the correct person using two identifiers.  Location: Patient: home Provider: remote office [place], [place])   I discussed the limitations of evaluation and management by telemedicine and the availability of in person appointments. The patient expressed understanding and agreed to proceed.  I discussed the assessment and treatment plan with the patient. The patient was provided an opportunity to ask questions and all were answered. The patient agreed with the plan and demonstrated an understanding of the instructions.   The patient was advised to call back or seek an in-person evaluation if the symptoms worsen or if the condition fails to improve as anticipated.  I provided 50 minutes of non-face-to-face time during this encounter.   Abegail Kloeppel R Wenceslaus Gist, LCSW   THERAPIST PROGRESS NOTE  Session Time: 410-5p  Participation Level: Active  Behavioral Response: Neat and Well GroomedAlertAnxious  Type of Therapy: Individual Therapy  Treatment Goals addressed: Problem: Anxiety Disorder CCP Problem  1 Reduce overall frequency, intensity, and duration of the anxiety so that daily functioning is not impaired per pt self report 3 out of 5 sessions documented.   Goal: LTG: Patient will score less than 5 on the Generalized Anxiety Disorder 7 Scale (GAD-7) Outcome: Progressing Goal: STG: Patient will participate in at least 80% of scheduled individual psychotherapy sessions Outcome: Progressing    ProgressTowards Goals: Progressing  Interventions: CBT  Summary: Ebony Lawson is a 36 y.o. female who presents with continuing symptoms related to anxiety disorder. Pt reports that she is compliant with her medication and that she is managing some situational stressors well and continuing to experience others she feels escalate  into "anxiety episodes" where she feels an escalation in anxiety along with rapid heartbeat.   Allowed pt to explore and express thoughts and feelings associated with recent life situations and external stressors.   Continued recommendations are as follows: self care behaviors, positive social engagements, focusing on overall work/home/life balance, and focusing on positive physical and emotional wellness.   Suicidal/Homicidal: No  Therapist Response: Pt is continuing to apply interventions learned in session into daily life situations. Pt is currently on track to meet goals utilizing interventions mentioned above. Personal growth and progress fluctuating/intermittent at time of session. Treatment to continue as indicated.   Plan: Return again in 4 weeks.  Diagnosis:  Encounter Diagnosis  Name Primary?   GAD (generalized anxiety disorder) Yes   Collaboration of Care: Other pt encouraged to continue follow ups with psychiatrist of record, Dr. Shea Evans  Patient/Guardian was advised Release of Information must be obtained prior to any record release in order to collaborate their care with an outside provider. Patient/Guardian was advised if they have not already done so to contact the registration department to sign all necessary forms in order for [place] to release information regarding their care.   Consent: Patient/Guardian gives verbal consent for treatment and assignment of benefits for services provided during this visit. Patient/Guardian expressed understanding and agreed to proceed.   [place], LCSW 12/27/2021

## 2021-12-27 NOTE — Plan of Care (Signed)
  Problem: Anxiety Disorder CCP Problem  1 Reduce overall frequency, intensity, and duration of the anxiety so that daily functioning is not impaired per pt self report 3 out of 5 sessions documented.   Goal: LTG: Patient will score less than 5 on the Generalized Anxiety Disorder 7 Scale (GAD-7) Outcome: Progressing Goal: STG: Patient will participate in at least 80% of scheduled individual psychotherapy sessions Outcome: Progressing

## 2022-01-04 ENCOUNTER — Encounter: Payer: Self-pay | Admitting: Dermatology

## 2022-01-04 ENCOUNTER — Ambulatory Visit: Payer: Managed Care, Other (non HMO) | Admitting: Dermatology

## 2022-01-04 DIAGNOSIS — L578 Other skin changes due to chronic exposure to nonionizing radiation: Secondary | ICD-10-CM

## 2022-01-04 DIAGNOSIS — D2261 Melanocytic nevi of right upper limb, including shoulder: Secondary | ICD-10-CM | POA: Diagnosis not present

## 2022-01-04 DIAGNOSIS — L910 Hypertrophic scar: Secondary | ICD-10-CM

## 2022-01-04 DIAGNOSIS — D1800 Hemangioma unspecified site: Secondary | ICD-10-CM

## 2022-01-04 DIAGNOSIS — L821 Other seborrheic keratosis: Secondary | ICD-10-CM

## 2022-01-04 DIAGNOSIS — L814 Other melanin hyperpigmentation: Secondary | ICD-10-CM

## 2022-01-04 DIAGNOSIS — Z1283 Encounter for screening for malignant neoplasm of skin: Secondary | ICD-10-CM | POA: Diagnosis not present

## 2022-01-04 DIAGNOSIS — L700 Acne vulgaris: Secondary | ICD-10-CM | POA: Diagnosis not present

## 2022-01-04 DIAGNOSIS — D229 Melanocytic nevi, unspecified: Secondary | ICD-10-CM

## 2022-01-04 MED ORDER — CLINDAMYCIN PHOSPHATE 1 % EX SOLN: CUTANEOUS | 2 refills | Status: DC

## 2022-01-04 MED ORDER — SPIRONOLACTONE 100 MG PO TABS: ORAL_TABLET | ORAL | 1 refills | Status: DC

## 2022-01-04 MED ORDER — ADAPALENE 0.3 % EX GEL: CUTANEOUS | 2 refills | Status: DC

## 2022-01-04 NOTE — Progress Notes (Unsigned)
Follow-Up Visit   Subjective  Ebony Lawson is a 36 y.o. female who presents for the following: Annual Exam (1 yr tbse, scrape at left knee that has fully healed. Mole at left forearm. ).  The patient presents for Total-Body Skin Exam (TBSE) for skin cancer screening and mole check.  The patient has spots, moles and lesions to be evaluated, some may be new or changing and the patient has concerns that these could be cancer.  Acne is doing well.   The following portions of the chart were reviewed this encounter and updated as appropriate:  Tobacco  Allergies  Meds  Problems  Med Hx  Surg Hx  Fam Hx      Review of Systems: No other skin or systemic complaints except as noted in HPI or Assessment and Plan.   Objective  Well appearing patient in no apparent distress; mood and affect are within normal limits.  A full examination was performed including scalp, head, eyes, ears, nose, lips, neck, chest, axillae, abdomen, back, buttocks, bilateral upper extremities, bilateral lower extremities, hands, feet, fingers, toes, fingernails, and toenails. All findings within normal limits unless otherwise noted below.  face Rare tiny resolving papule and rare open comedone  Right Upper Arm - Anterior 0.8 cm thin tan papule slightly darker centrally   Left Knee - Anterior Hypertrophic scar   Assessment & Plan  Acne vulgaris face  Chronic condition with duration or expected duration over one year. Currently well-controlled.  109/78  Continue Adapalene 0.3 % gel apply pea sized amount to entire face qhs 90 day rf D/c  Amzeeq 4 % foam due to theoretical risk of bluish hyperpigmentation with long-term use Start clindamycin 1 % solution - apply topically qhs before adapalene to face for acne 90 day rf. Continue Cln wash (hypochlorous acid acne wash) Continue spironolactone 100 mg po qd  90 day rf  Spironolactone can cause increased urination and cause blood pressure to decrease.  Please watch for signs of lightheadedness and be cautious when changing position. It can sometimes cause breast tenderness or an irregular period in premenopausal women. It can also increase potassium. The increase in potassium usually is not a concern unless you are taking other medicines that also increase potassium, so please be sure your doctor knows all of the other medications you are taking. This medication should not be taken by pregnant women.  This medicine should also not be taken together with sulfa drugs like Bactrim (trimethoprim/sulfamethexazole).   Topical retinoid medications like tretinoin/Retin-A, adapalene/Differin, tazarotene/Fabior, and Epiduo/Epiduo Forte can cause dryness and irritation when first started. Only apply a pea-sized amount to the entire affected area. Avoid applying it around the eyes, edges of mouth and creases at the nose. If you experience irritation, use a good moisturizer first and/or apply the medicine less often. If you are doing well with the medicine, you can increase how often you use it until you are applying every night. Be careful with sun protection while using this medication as it can make you sensitive to the sun. This medicine should not be used by pregnant women.     clindamycin (CLEOCIN T) 1 % external solution - face Apply topically See admin instructions. apply night before adapalene to face for acne  Related Medications Adapalene (DIFFERIN) 0.3 % gel Apply pea size amount to entire face once every night  spironolactone (ALDACTONE) 100 MG tablet TAKE 1 TABLET(100 MG) BY MOUTH DAILY  Nevus Right Upper Arm - Anterior  without features  suspicious for malignancy on dermoscopy  Benign-appearing.  Observation.  Call clinic for new or changing lesions.  Recommend daily use of broad spectrum spf 30+ sunscreen to sun-exposed areas.    Hypertrophic scar Left Knee - Anterior  Lentigines - Scattered tan macules - Due to sun exposure -  Benign-appearing, observe - Recommend daily broad spectrum sunscreen SPF 30+ to sun-exposed areas, reapply every 2 hours as needed. - Call for any changes  Seborrheic Keratoses - Stuck-on, waxy, tan-brown papules and/or plaques  - Benign-appearing - Discussed benign etiology and prognosis. - Observe - Call for any changes  Melanocytic Nevi - Tan-brown and/or pink-flesh-colored symmetric macules and papules - Benign appearing on exam today - Observation - Call clinic for new or changing moles - Recommend daily use of broad spectrum spf 30+ sunscreen to sun-exposed areas.   Hemangiomas - Red papules - Discussed benign nature - Observe - Call for any changes  Actinic Damage - Chronic condition, secondary to cumulative UV/sun exposure - diffuse scaly erythematous macules with underlying dyspigmentation - Recommend daily broad spectrum sunscreen SPF 30+ to sun-exposed areas, reapply every 2 hours as needed.  - Staying in the shade or wearing long sleeves, sun glasses (UVA+UVB protection) and wide brim hats (4-inch brim around the entire circumference of the hat) are also recommended for sun protection.  - Call for new or changing lesions.  Skin cancer screening performed today. Return in about 1 year (around 01/05/2023) for tbse. I, Ruthell Rummage, CMA, am acting as scribe for Forest Gleason, MD.  Documentation: I have reviewed the above documentation for accuracy and completeness, and I agree with the above.  Forest Gleason, MD

## 2022-01-04 NOTE — Patient Instructions (Addendum)
Spironolactone can cause increased urination and cause blood pressure to decrease. Please watch for signs of lightheadedness and be cautious when changing position. It can sometimes cause breast tenderness or an irregular period in premenopausal women. It can also increase potassium. The increase in potassium usually is not a concern unless you are taking other medicines that also increase potassium, so please be sure your doctor knows all of the other medications you are taking. This medication should not be taken by pregnant women.  This medicine should also not be taken together with sulfa drugs like Bactrim (trimethoprim/sulfamethexazole).   Topical retinoid medications like tretinoin/Retin-A, adapalene/Differin, tazarotene/Fabior, and Epiduo/Epiduo Forte can cause dryness and irritation when first started. Only apply a pea-sized amount to the entire affected area. Avoid applying it around the eyes, edges of mouth and creases at the nose. If you experience irritation, use a good moisturizer first and/or apply the medicine less often. If you are doing well with the medicine, you can increase how often you use it until you are applying every night. Be careful with sun protection while using this medication as it can make you sensitive to the sun. This medicine should not be used by pregnant women.       Recommend Colorescience Brush-On Sunscreen Mineral Powder either Light to medium. Could be purchased from the [email or web address]  and other retailers.    For scar at left knee   Recommend Serica moisturizing scar formula cream every night or [facility] brand or Mederma silicone scar sheet every night for the first year after a scar appears to help with scar remodeling if desired. Scars remodel on their own for a full year and will gradually improve in appearance over time.   Recommend taking Heliocare sun protection supplement daily in sunny weather for additional sun protection. For maximum protection on  the sunniest days, you can take up to 2 capsules of regular Heliocare OR take 1 capsule of Heliocare Ultra. For prolonged exposure (such as a full day in the sun), you can repeat your dose of the supplement 4 hours after your first dose. Heliocare can be purchased at [employer], at some [facility] or at [email or web address].     Melanoma ABCDEs  Melanoma is the most dangerous type of skin cancer, and is the leading cause of death from skin disease.  You are more likely to develop melanoma if you: Have light-colored skin, light-colored eyes, or red or blond hair Spend a lot of time in the sun Tan regularly, either outdoors or in a tanning bed Have had blistering sunburns, especially during childhood Have a close family member who has had a melanoma Have atypical moles or large birthmarks  Early detection of melanoma is key since treatment is typically straightforward and cure rates are extremely high if we catch it early.   The first sign of melanoma is often a change in a mole or a new dark spot.  The ABCDE system is a way of remembering the signs of melanoma.  A for asymmetry:  The two halves do not match. B for border:  The edges of the growth are irregular. C for color:  A mixture of colors are present instead of an even brown color. D for diameter:  Melanomas are usually (but not always) greater than 47m - the size of a pencil eraser. E for evolution:  The spot keeps changing in size, shape, and color.  Please check your skin once per month between visits. You can use a small  mirror in front and a large mirror behind you to keep an eye on the back side or your body.   If you see any new or changing lesions before your next follow-up, please call to schedule a visit.  Please continue daily skin protection including broad spectrum sunscreen SPF 30+ to sun-exposed areas, reapplying every 2 hours as needed when you're outdoors.   Staying in the shade or wearing long sleeves, sun  glasses (UVA+UVB protection) and wide brim hats (4-inch brim around the entire circumference of the hat) are also recommended for sun protection.    Due to recent changes in healthcare laws, you may see results of your pathology and/or laboratory studies on MyChart before the doctors have had a chance to review them. We understand that in some cases there may be results that are confusing or concerning to you. Please understand that not all results are received at the same time and often the doctors may need to interpret multiple results in order to provide you with the best plan of care or course of treatment. Therefore, we ask that you please give [place] 2 business days to thoroughly review all your results before contacting the office for clarification. Should we see a critical lab result, you will be contacted sooner.   If You Need Anything After Your Visit  If you have any questions or concerns for your doctor, please call our main line at [phone] and press option 4 to reach your doctor's medical assistant. If no one answers, please leave a voicemail as directed and we will return your call as soon as possible. Messages left after 4 pm will be answered the following business day.   You may also send [place] a message via [place]. We typically respond to MyChart messages within 1-2 business days.  For prescription refills, please ask your pharmacy to contact our office. Our fax number is [phone].  If you have an urgent issue when the clinic is closed that cannot wait until the next business day, you can page your doctor at the number below.    Please note that while we do our best to be available for urgent issues outside of office hours, we are not available 24/7.   If you have an urgent issue and are unable to reach [place], you may choose to seek medical care at your doctor's office, retail clinic, urgent care center, or emergency room.  If you have a medical emergency, please immediately call  911 or go to the emergency department.  Pager Numbers  - Dr. Nehemiah Massed: [phone]  - Dr. Laurence Ferrari: [phone]  - Dr. Nicole Kindred: [phone]  In the event of inclement weather, please call our main line at [phone] for an update on the status of any delays or closures.  Dermatology Medication Tips: Please keep the boxes that topical medications come in in order to help keep track of the instructions about where and how to use these. Pharmacies typically print the medication instructions only on the boxes and not directly on the medication tubes.   If your medication is too expensive, please contact our office at [phone] option 4 or send [place] a message through [place].   We are unable to tell what your co-pay for medications will be in advance as this is different depending on your insurance coverage. However, we may be able to find a substitute medication at lower cost or fill out paperwork to get insurance to cover a needed medication.   If a prior authorization  is required to get your medication covered by your insurance company, please allow [place] 1-2 business days to complete this process.  Drug prices often vary depending on where the prescription is filled and some pharmacies may offer cheaper prices.  The website www.goodrx.com contains coupons for medications through different pharmacies. The prices here do not account for what the cost may be with help from insurance (it may be cheaper with your insurance), but the website can give you the price if you did not use any insurance.  - You can print the associated coupon and take it with your prescription to the pharmacy.  - You may also stop by our office during regular business hours and pick up a GoodRx coupon card.  - If you need your prescription sent electronically to a different pharmacy, notify our office through [facility] or by phone at [phone] option 4.     Si Usted Necesita Algo Despus de Su  Visita  Tambin puede enviarnos un mensaje a travs de Pharmacist, community. Por lo general respondemos a los mensajes de MyChart en el transcurso de 1 a 2 das hbiles.  Para renovar recetas, por favor pida a su farmacia que se ponga en contacto con nuestra oficina. Harland Dingwall de fax es [place] [phone].  Si tiene un asunto urgente cuando la clnica est cerrada y que no puede esperar hasta el siguiente da hbil, puede llamar/localizar a su doctor(a) al nmero que aparece a continuacin.   Por favor, tenga en cuenta que aunque hacemos todo lo posible para estar disponibles para asuntos urgentes fuera del horario de [employer], no estamos disponibles las 24 horas del da, los 7 das de la [place].   Si tiene un problema urgente y no puede comunicarse con nosotros, puede optar por buscar atencin mdica  en el consultorio de su doctor(a), en una clnica privada, en un centro de atencin urgente o en una sala de emergencias.  Si tiene Engineering geologist, por favor llame inmediatamente al 911 o vaya a la sala de emergencias.  Nmeros de bper  - Dr. Nehemiah Massed: [phone]  - Dra. Moye: [phone]  - Dra. Nicole Kindred: [phone]  En caso de inclemencias del [place], por favor llame a Johnsie Kindred principal al [phone] para una actualizacin sobre el [place] de cualquier retraso o cierre.  Consejos para la medicacin en dermatologa: Por favor, guarde las cajas en las que vienen los medicamentos de uso tpico para ayudarle a seguir las instrucciones sobre dnde y cmo usarlos. Las farmacias generalmente imprimen las instrucciones del medicamento slo en las cajas y no directamente en los tubos del [place].   Si su medicamento es muy caro, por favor, pngase en contacto con Zigmund Daniel llamando al [phone] y presione la opcin 4 o envenos un mensaje a travs de Pharmacist, community.   No podemos decirle cul ser su copago por los medicamentos por adelantado ya que esto es diferente dependiendo de la  cobertura de su seguro. Sin embargo, es posible que podamos encontrar un medicamento sustituto a Electrical engineer un formulario para que el seguro cubra el medicamento que se considera necesario.   Si se requiere una autorizacin previa para que su compaa de seguros [place] su medicamento, por favor permtanos de 1 a 2 das hbiles para completar este proceso.  Los precios de los medicamentos varan con frecuencia dependiendo del Environmental consultant de dnde se surte la receta y alguna farmacias pueden ofrecer precios ms baratos.  El sitio web www.goodrx.com tiene cupones para medicamentos  de diferentes farmacias. Los precios aqu no tienen en cuenta lo que podra costar con la ayuda del seguro (puede ser ms barato con su seguro), pero el sitio web puede darle el precio si no utiliz ningn seguro.  - Puede imprimir el cupn correspondiente y llevarlo con su receta a la farmacia.  - Tambin puede pasar por nuestra oficina durante el horario de atencin regular y recoger una tarjeta de cupones de GoodRx.  - Si necesita que su receta se enve electrnicamente a una farmacia diferente, informe a nuestra oficina a travs de MyChart de [place] o por telfono llamando al [phone] y presione la opcin 4.  

## 2022-01-05 ENCOUNTER — Encounter: Payer: Self-pay | Admitting: Dermatology

## 2022-01-24 ENCOUNTER — Other Ambulatory Visit: Payer: Self-pay | Admitting: Nurse Practitioner

## 2022-01-24 DIAGNOSIS — N6489 Other specified disorders of breast: Secondary | ICD-10-CM

## 2022-03-09 ENCOUNTER — Ambulatory Visit
Admission: RE | Admit: 2022-03-09 | Discharge: 2022-03-09 | Disposition: A | Discharge status: Home / Self Care | Payer: Managed Care, Other (non HMO) | Source: Ambulatory Visit | Attending: Nurse Practitioner | Admitting: Nurse Practitioner

## 2022-03-09 DIAGNOSIS — N6489 Other specified disorders of breast: Secondary | ICD-10-CM | POA: Diagnosis not present

## 2022-05-03 ENCOUNTER — Encounter: Payer: Managed Care, Other (non HMO) | Admitting: Nurse Practitioner

## 2022-05-16 ENCOUNTER — Telehealth: Payer: Self-pay | Admitting: Nurse Practitioner

## 2022-05-16 LAB — IRON,TIBC AND FERRITIN PANEL
Ferritin: 39 ng/mL (ref 15–150)
Iron Saturation: 42 % (ref 15–55)
Iron: 137 ug/dL (ref 27–159)
Total Iron Binding Capacity: 323 ug/dL (ref 250–450)
UIBC: 186 ug/dL (ref 131–425)

## 2022-05-16 LAB — CBC WITH DIFFERENTIAL/PLATELET
Basophils Absolute: 0 10*3/uL (ref 0.0–0.2)
Basos: 1 %
EOS (ABSOLUTE): 0.1 10*3/uL (ref 0.0–0.4)
Eos: 2 %
Hematocrit: 44.2 % (ref 34.0–46.6)
Hemoglobin: 14.9 g/dL (ref 11.1–15.9)
Immature Grans (Abs): 0 10*3/uL (ref 0.0–0.1)
Immature Granulocytes: 0 %
Lymphocytes Absolute: 2 10*3/uL (ref 0.7–3.1)
Lymphs: 32 %
MCH: 28.7 pg (ref 26.6–33.0)
MCHC: 33.7 g/dL (ref 31.5–35.7)
MCV: 85 fL (ref 79–97)
Monocytes Absolute: 0.5 10*3/uL (ref 0.1–0.9)
Monocytes: 7 %
Neutrophils Absolute: 3.7 10*3/uL (ref 1.4–7.0)
Neutrophils: 58 %
Platelets: 204 10*3/uL (ref 150–450)
RBC: 5.19 x10E6/uL (ref 3.77–5.28)
RDW: 12.5 % (ref 11.7–15.4)
WBC: 6.3 10*3/uL (ref 3.4–10.8)

## 2022-05-16 LAB — B12 AND FOLATE PANEL
Folate: 19.2 ng/mL (ref >3.0)
Vitamin B-12: 794 pg/mL (ref 232–1245)

## 2022-05-16 NOTE — Telephone Encounter (Signed)
Left vm and sent mychart message to confirm 05/22/22 appointment-[name] 

## 2022-05-22 ENCOUNTER — Ambulatory Visit (INDEPENDENT_AMBULATORY_CARE_PROVIDER_SITE_OTHER): Payer: Managed Care, Other (non HMO) | Admitting: Nurse Practitioner

## 2022-05-22 ENCOUNTER — Encounter: Payer: Self-pay | Admitting: Nurse Practitioner

## 2022-05-22 VITALS — BP 111/67 | HR 81 | Temp 98.4°F | Resp 16 | Ht 60.0 in | Wt 112.2 lb

## 2022-05-22 DIAGNOSIS — R300 Dysuria: Secondary | ICD-10-CM | POA: Diagnosis not present

## 2022-05-22 DIAGNOSIS — D508 Other iron deficiency anemias: Secondary | ICD-10-CM

## 2022-05-22 DIAGNOSIS — E782 Mixed hyperlipidemia: Secondary | ICD-10-CM

## 2022-05-22 DIAGNOSIS — Z0001 Encounter for general adult medical examination with abnormal findings: Secondary | ICD-10-CM | POA: Diagnosis not present

## 2022-05-22 DIAGNOSIS — Z760 Encounter for issue of repeat prescription: Secondary | ICD-10-CM

## 2022-05-22 MED ORDER — HYDROXYZINE HCL 10 MG PO TABS: 10.0000 mg | ORAL_TABLET | Freq: Two times a day (BID) | ORAL | 1 refills | Status: DC | PRN

## 2022-05-22 MED ORDER — MECLIZINE HCL 12.5 MG PO TABS: 12.5000 mg | ORAL_TABLET | Freq: Three times a day (TID) | ORAL | 1 refills | Status: DC | PRN

## 2022-05-22 MED ORDER — ONDANSETRON 4 MG PO TBDP: 4.0000 mg | ORAL_TABLET | Freq: Three times a day (TID) | ORAL | 0 refills | Status: DC | PRN

## 2022-05-22 MED ORDER — FERRALET 90 90-1 MG PO TABS: 1.0000 | ORAL_TABLET | Freq: Every day | ORAL | 5 refills | Status: DC

## 2022-05-22 NOTE — Progress Notes (Signed)
[facility] [place], [place] [address]  Internal MEDICINE  Office Visit Note  Patient Name: Ebony Lawson  [id number]  [id number]  Date of Service: 05/22/2022  Chief Complaint  Patient presents with   Annual Exam    HPI Ajla presents for an annual well visit and physical exam.  Well-appearing 36 y.o. female with iron deficiency anemia, GAD and arthritis Labs: done, CBC, B12 and iron improved.  New or worsening pain: none  Other concerns: had increased anxiety for a couple of days but better now Has nexplanon implant now for contraception       05/22/2022   11:04 AM  Depression screen PHQ 2/9  Decreased Interest 0  Down, Depressed, Hopeless 0  PHQ - 2 Score 0     Current Medication: Outpatient Encounter Medications as of 05/22/2022  Medication Sig   Adapalene (DIFFERIN) 0.3 % gel Apply pea size amount to entire face once every night   Calcium Carbonate-Vitamin D (CALCIUM 600+D3 PO) Take 1 tablet by mouth daily.   clindamycin (CLEOCIN T) 1 % external solution Apply topically See admin instructions. apply night before adapalene to face for acne   etonogestrel (NEXPLANON) 68 MG IMPL implant 1 each by Subdermal route once.   Polypodium Leucotomos (HELIOCARE PO) Take by mouth.   spironolactone (ALDACTONE) 100 MG tablet TAKE 1 TABLET(100 MG) BY MOUTH DAILY   triamcinolone cream (KENALOG) 0.1 % Apply 1 application topically 2 (two) times daily as needed. For up to 2 weeks as needed for rash. Avoid applying to face, groin, and axilla. Use as directed. Long-term use can cause thinning of the skin.   VITAMIN D PO Take by mouth.   [DISCONTINUED] Fe Cbn-Fe Gluc-FA-B12-C-DSS (FERRALET 90) 90-1 MG TABS Take 1 tablet by mouth daily.   [DISCONTINUED] hydrOXYzine (ATARAX/VISTARIL) 10 MG tablet Take 1-2 tablets (10-20 mg total) by mouth 2 (two) times daily as needed. For severe anxiety   [DISCONTINUED] meclizine (ANTIVERT) 12.5 MG tablet Take 1 tablet (12.5 mg  total) by mouth 3 (three) times daily as needed for dizziness or nausea.   [DISCONTINUED] ondansetron (ZOFRAN-ODT) 4 MG disintegrating tablet Take 1 tablet (4 mg total) by mouth every 8 (eight) hours as needed for nausea or vomiting.   Fe Cbn-Fe Gluc-FA-B12-C-DSS (FERRALET 90) 90-1 MG TABS Take 1 tablet by mouth daily.   hydrOXYzine (ATARAX) 10 MG tablet Take 1-2 tablets (10-20 mg total) by mouth 2 (two) times daily as needed for anxiety. For severe anxiety   meclizine (ANTIVERT) 12.5 MG tablet Take 1 tablet (12.5 mg total) by mouth 3 (three) times daily as needed for dizziness or nausea.   ondansetron (ZOFRAN-ODT) 4 MG disintegrating tablet Take 1 tablet (4 mg total) by mouth every 8 (eight) hours as needed for nausea or vomiting.   No facility-administered encounter medications on file as of 05/22/2022.    Surgical History: History reviewed. No pertinent surgical history.  Medical History: Past Medical History:  Diagnosis Date   Heart murmur    Migraine     Family History: Family History  Problem Relation Age of Onset   Hyperlipidemia Mother    Cancer Father        prostate and skin cancer   Hyperlipidemia Father    Heart disease Father        CAD - triple bypass 2014   Hypertension Father    Melanoma Father    Hearing loss Paternal Grandfather    Hypertension Paternal Grandfather    Anxiety disorder  Brother    Breast cancer Neg Hx     Social History   Socioeconomic History   Marital status: Single    Spouse name: Not on file   Number of children: 0   Years of education: 16   Highest education level: Bachelor's degree (e.g., BA, AB, BS)  Occupational History   Occupation: DNA Animal nutritionist: [employer]  Tobacco Use   Smoking status: Never   Smokeless tobacco: Never  Vaping Use   Vaping Use: Never used  Substance and Sexual Activity   Alcohol use: Yes    Alcohol/week: 0.0 standard drinks of alcohol    Comment: 2-3 drinks monthly   Drug use: No    Sexual activity: Yes    Partners: Male    Birth control/protection: OCP    Comment: 1 partner   Other Topics Concern   Not on file  Social History Narrative   Jiyah grew up outside of [place]. 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   Exercise - none at present   Caffeine - rare    Social Determinants of Health   Financial Resource Strain: Low Risk  (02/15/2018)   Overall Financial Resource Strain (CARDIA)    Difficulty of Paying Living Expenses: Not hard at all  Food Insecurity: No Food Insecurity (02/15/2018)   Hunger Vital Sign    Worried About Running Out of Food in the Last Year: Never true    Ran Out of Food in the Last Year: Never true  Transportation Needs: No Transportation Needs (02/15/2018)   PRAPARE - Hydrologist (Medical): No    Lack of Transportation (Non-Medical): No  Physical Activity: Sufficiently Active (02/15/2018)   Exercise Vital Sign    Days of Exercise per Week: 4 days    Minutes of Exercise per Session: 50 min  Stress: Stress Concern Present (02/15/2018)   [place]    Feeling of Stress : Very much  Social Connections: Unknown (02/15/2018)   Social Connection and Isolation Panel [NHANES]    Frequency of Communication with Friends and Family: Not on file    Frequency of Social Gatherings with Friends and Family: Not on file    Attends Religious Services: More than 4 times per year    Active Member of [employer] or Organizations: Yes    Attends Archivist Meetings: More than 4 times per year    Marital Status: Never married  Intimate Partner Violence: Not At Risk (02/15/2018)   Humiliation, Afraid, Rape, and Kick questionnaire    Fear of Current or Ex-Partner: No    Emotionally Abused: No    Physically Abused: No    Sexually Abused: No       Review of Systems  Constitutional:  Negative for activity change, appetite change, chills, fatigue, fever and unexpected weight change.  HENT: Negative.  Negative for congestion, ear pain, rhinorrhea, sore throat and trouble swallowing.   Eyes: Negative.   Respiratory: Negative.  Negative for cough, chest tightness, shortness of breath and wheezing.   Cardiovascular: Negative.  Negative for chest pain.  Gastrointestinal: Negative.  Negative for abdominal pain, blood in stool, constipation, diarrhea, nausea and vomiting.  Endocrine: Negative.   Genitourinary: Negative.  Negative for difficulty urinating, dysuria, frequency, hematuria and urgency.  Musculoskeletal: Negative.  Negative for arthralgias, back pain, joint swelling, myalgias and neck pain.  Skin: Negative.  Negative for rash and wound.  Allergic/Immunologic: Negative.  Negative for immunocompromised state.  Neurological: Negative.  Negative for dizziness, seizures, numbness and headaches.  Hematological: Negative.   Psychiatric/Behavioral: Negative.  Negative for behavioral problems, self-injury and suicidal ideas. The patient is not nervous/anxious.     Vital Signs: BP 111/67   Pulse 81   Temp 98.4 F (36.9 C)   Resp 16   Ht 5' (1.524 m)   Wt 112 lb 3.2 oz (50.9 kg)   SpO2 99%   BMI 21.91 kg/m    Physical Exam Vitals reviewed.  Constitutional:      General: She is awake. She is not in acute distress.    Appearance: Normal appearance. She is well-developed, well-groomed and normal weight. She is not diaphoretic.  HENT:     Head: Normocephalic and atraumatic.     Right Ear: Tympanic membrane, ear canal and external ear normal.     Left Ear: Tympanic membrane, ear canal and external ear normal.     Nose: Nose normal. No congestion or rhinorrhea.     Mouth/Throat:     Mouth: Mucous membranes are moist.     Pharynx: Oropharynx is clear. No oropharyngeal exudate or posterior oropharyngeal erythema.  Eyes:      General: No scleral icterus.       Right eye: No discharge.        Left eye: No discharge.     Extraocular Movements: Extraocular movements intact.     Conjunctiva/sclera: Conjunctivae normal.     Pupils: Pupils are equal, round, and reactive to light.  Neck:     Thyroid: No thyromegaly.     Vascular: No JVD.     Trachea: No tracheal deviation.  Cardiovascular:     Rate and Rhythm: Normal rate and regular rhythm.     Pulses: Normal pulses.     Heart sounds: Normal heart sounds, S1 normal and S2 normal. No murmur heard.    No friction rub. No gallop.  Pulmonary:     Effort: Pulmonary effort is normal. No respiratory distress.     Breath sounds: Normal breath sounds. No stridor. No wheezing or rales.  Chest:     Chest wall: No tenderness.  Abdominal:     General: Bowel sounds are normal. There is no distension.     Palpations: Abdomen is soft. There is no mass.     Tenderness: There is no abdominal tenderness. There is no guarding or rebound.  Genitourinary:    Uterus: Normal.      Adnexa: Right adnexa normal and left adnexa normal.  Musculoskeletal:        General: No tenderness or deformity. Normal range of motion.     Cervical back: Normal range of motion and neck supple.  Lymphadenopathy:     Cervical: No cervical adenopathy.  Skin:    General: Skin is warm and dry.     Capillary Refill: Capillary refill takes less than 2 seconds.     Coloration: Skin is not pale.     Findings: No erythema or rash.  Neurological:     Mental Status: She is alert and oriented to person, place, and time.     Cranial Nerves: No cranial nerve deficit.     Motor: No abnormal muscle tone.     Coordination: Coordination normal.     Gait: Gait normal.  Deep Tendon Reflexes: Reflexes are normal and symmetric.  Psychiatric:        Mood and Affect: Mood normal.        Behavior: Behavior normal. Behavior is cooperative.        Thought Content: Thought content normal.        Judgment: Judgment  normal.        Assessment/Plan: 1. Encounter for general adult medical examination with abnormal findings Age-appropriate preventive screenings and vaccinations discussed, annual physical exam completed. Routine labs for health maintenance drawn prior to today, results discussed with patient today. PHM updated.    2. Other iron deficiency anemia Improving, followed by hematology  3. Mixed hyperlipidemia Will repeat lipid panel next year  4. Dysuria Routine urinalysis done - UA/M w/rflx Culture, Routine  5. Medication refill - ondansetron (ZOFRAN-ODT) 4 MG disintegrating tablet; Take 1 tablet (4 mg total) by mouth every 8 (eight) hours as needed for nausea or vomiting.  Dispense: 20 tablet; Refill: 0 - meclizine (ANTIVERT) 12.5 MG tablet; Take 1 tablet (12.5 mg total) by mouth 3 (three) times daily as needed for dizziness or nausea.  Dispense: 90 tablet; Refill: 1 - Fe Cbn-Fe Gluc-FA-B12-C-DSS (FERRALET 90) 90-1 MG TABS; Take 1 tablet by mouth daily.  Dispense: 30 tablet; Refill: 5 - hydrOXYzine (ATARAX) 10 MG tablet; Take 1-2 tablets (10-20 mg total) by mouth 2 (two) times daily as needed for anxiety. For severe anxiety  Dispense: 30 tablet; Refill: 1      General Counseling: Naydeline verbalizes understanding of the findings of todays visit and agrees with plan of treatment. I have discussed any further diagnostic evaluation that may be needed or ordered today. We also reviewed her medications today. she has been encouraged to call the office with any questions or concerns that should arise related to todays visit.    Orders Placed This Encounter  Procedures   UA/M w/rflx Culture, Routine    Meds ordered this encounter  Medications   ondansetron (ZOFRAN-ODT) 4 MG disintegrating tablet    Sig: Take 1 tablet (4 mg total) by mouth every 8 (eight) hours as needed for nausea or vomiting.    Dispense:  20 tablet    Refill:  0   meclizine (ANTIVERT) 12.5 MG tablet    Sig: Take 1  tablet (12.5 mg total) by mouth 3 (three) times daily as needed for dizziness or nausea.    Dispense:  90 tablet    Refill:  1   Fe Cbn-Fe Gluc-FA-B12-C-DSS (FERRALET 90) 90-1 MG TABS    Sig: Take 1 tablet by mouth daily.    Dispense:  30 tablet    Refill:  5    Iron and B12 deficiency.   hydrOXYzine (ATARAX) 10 MG tablet    Sig: Take 1-2 tablets (10-20 mg total) by mouth 2 (two) times daily as needed for anxiety. For severe anxiety    Dispense:  30 tablet    Refill:  1    Return in about 6 months (around 11/21/2022) for F/U, Audrinna Sherman PCP.   Total time spent:30 Minutes Time spent includes review of chart, medications, test results, and follow up plan with the patient.   [place] Controlled Substance Database was reviewed by me.  This patient was seen by Jonetta Osgood, FNP-C in collaboration with Dr. Clayborn Bigness as a part of collaborative care agreement.  Lj Miyamoto R. Valetta Fuller, MSN, FNP-C Internal medicine

## 2022-05-23 ENCOUNTER — Other Ambulatory Visit: Payer: Self-pay

## 2022-05-23 ENCOUNTER — Encounter: Payer: Self-pay | Admitting: Nurse Practitioner

## 2022-05-23 DIAGNOSIS — Z760 Encounter for issue of repeat prescription: Secondary | ICD-10-CM

## 2022-05-23 LAB — MICROSCOPIC EXAMINATION
Bacteria, UA: NONE SEEN
Casts: NONE SEEN /lpf
Epithelial Cells (non renal): NONE SEEN /hpf (ref 0–10)
RBC, Urine: NONE SEEN /hpf (ref 0–2)

## 2022-05-23 LAB — UA/M W/RFLX CULTURE, ROUTINE
Bilirubin, UA: NEGATIVE
Glucose, UA: NEGATIVE
Ketones, UA: NEGATIVE
Leukocytes,UA: NEGATIVE
Nitrite, UA: NEGATIVE
Protein,UA: NEGATIVE
Specific Gravity, UA: 1.02 (ref 1.005–1.030)
Urobilinogen, Ur: 0.2 mg/dL (ref 0.2–1.0)
pH, UA: 5.5 (ref 5.0–7.5)

## 2022-05-24 MED ORDER — HYDROXYZINE HCL 10 MG PO TABS: 10.0000 mg | ORAL_TABLET | Freq: Two times a day (BID) | ORAL | 1 refills | Status: DC | PRN

## 2022-05-24 NOTE — Addendum Note (Signed)
Addended by: Jonetta Osgood on: 05/24/2022 08:25 AM   Modules accepted: Orders

## 2022-06-22 ENCOUNTER — Ambulatory Visit: Payer: Managed Care, Other (non HMO) | Admitting: Dermatology

## 2022-06-22 ENCOUNTER — Encounter: Payer: Self-pay | Admitting: Dermatology

## 2022-06-22 VITALS — BP 106/64 | HR 89

## 2022-06-22 DIAGNOSIS — B354 Tinea corporis: Secondary | ICD-10-CM

## 2022-06-22 DIAGNOSIS — L700 Acne vulgaris: Secondary | ICD-10-CM

## 2022-06-22 DIAGNOSIS — L309 Dermatitis, unspecified: Secondary | ICD-10-CM

## 2022-06-22 MED ORDER — SPIRONOLACTONE 100 MG PO TABS: 100.0000 mg | ORAL_TABLET | Freq: Every evening | ORAL | 11 refills | Status: DC

## 2022-06-22 MED ORDER — SPIRONOLACTONE 25 MG PO TABS: 25.0000 mg | ORAL_TABLET | Freq: Every morning | ORAL | 0 refills | Status: DC

## 2022-06-22 NOTE — Progress Notes (Signed)
Follow-Up Visit   Subjective  Ebony Lawson is a 37 y.o. female who presents for the following: Acne (Face, Adapalene 0.3% gel qhs, Clindamycin sol qhs, Spironolactone '100mg'$  1 po qd, pt said she has some flares of acne around jaw but otherwise acne controlled) and itchy area (R underside chin, 2wks, started [facility] cr for a few days , vanicream moisturizer, pt does have a cat).  The following portions of the chart were reviewed this encounter and updated as appropriate:   Tobacco  Allergies  Meds  Problems  Med Hx  Surg Hx  Fam Hx      Review of Systems:  No other skin or systemic complaints except as noted in HPI or Assessment and Plan.  Objective  Well appearing patient in no apparent distress; mood and affect are within normal limits.  A focused examination was performed including face. Relevant physical exam findings are noted in the Assessment and Plan.  face Scattered inflammatory paps and pustules lower face and back, rare open comedone  back Scaly pink papules coalescing to plaques upper back  R underside chin Sclay pink annular plaque    Assessment & Plan  Acne vulgaris face  Chronic and persistent condition with duration or expected duration over one year. Condition is symptomatic/ bothersome to patient. Not currently at goal.  BP 106/64 Increase Spironolactone to Spironolactone '25mg'$  1 po qam and Spironolactone '100mg'$  1 po qpm, pt will take a half a pill in the morning for first few days and watch closely for lightheadedness Cont Adapalene gel 0.3% qhs to face and back Cont Clindamycin sol qd to face and back  Discussed winlevi but pt prefers to try increased dose of spironolactone instead at this time Will check potassium in 3 weeks  Spironolactone can cause increased urination and cause blood pressure to decrease. Please watch for signs of lightheadedness and be cautious when changing position. It can sometimes cause breast tenderness or an irregular period  in premenopausal women. It can also increase potassium. The increase in potassium usually is not a concern unless you are taking other medicines that also increase potassium, so please be sure your doctor knows all of the other medications you are taking. This medication should not be taken by pregnant women.  This medicine should also not be taken together with sulfa drugs like Bactrim (trimethoprim/sulfamethexazole).    Topical retinoid medications like tretinoin/Retin-A, adapalene/Differin, tazarotene/Fabior, and Epiduo/Epiduo Forte can cause dryness and irritation when first started. Only apply a pea-sized amount to the entire affected area. Avoid applying it around the eyes, edges of mouth and creases at the nose. If you experience irritation, use a good moisturizer first and/or apply the medicine less often. If you are doing well with the medicine, you can increase how often you use it until you are applying every night. Be careful with sun protection while using this medication as it can make you sensitive to the sun. This medicine should not be used by pregnant women.    spironolactone (ALDACTONE) 25 MG tablet - face Take 1 tablet (25 mg total) by mouth in the morning.  spironolactone (ALDACTONE) 100 MG tablet - face Take 1 tablet (100 mg total) by mouth every evening.  Related Procedures Potassium  Related Medications Adapalene (DIFFERIN) 0.3 % gel Apply pea size amount to entire face once every night  clindamycin (CLEOCIN T) 1 % external solution Apply topically See admin instructions. apply night before adapalene to face for acne  spironolactone (ALDACTONE) 100 MG tablet  TAKE 1 TABLET(100 MG) BY MOUTH DAILY  Eczema, unspecified type back  Chronic and persistent condition with duration or expected duration over one year. Condition is symptomatic/ bothersome to patient. Not currently at goal.  Start [facility] 0.1% cr twice a day as needed for up to 2 weeks. Avoid applying to face,  groin, and axilla. Use as directed. Long-term use can cause thinning of the skin.  Call if this does not clear it within 2 weeks or if it recurs often and she has to use the medicine frequently.  Topical steroids (such as triamcinolone, fluocinolone, fluocinonide, mometasone, clobetasol, halobetasol, betamethasone, hydrocortisone) can cause thinning and lightening of the skin if they are used for too long in the same area. Your physician has selected the right strength medicine for your problem and area affected on the body. Please use your medication only as directed by your physician to prevent side effects.    Tinea corporis R underside chin  KOH today positive for hyphae  Start otc Lamisil/terbinafine cream twice a day until clear and then for one more week  Call if does not clear or if recurs   Return in about 3 months (around 09/21/2022) for Acne f/u.  I, Othelia Pulling, RMA, am acting as scribe for Forest Gleason, MD .  Documentation: I have reviewed the above documentation for accuracy and completeness, and I agree with the above.  Forest Gleason, MD

## 2022-06-22 NOTE — Patient Instructions (Addendum)
Increase Spironolactone to Spironolactone '25mg'$  1 pill in the morning and Spironolactone '100mg'$  1 pill in the evening, patient will take a half a pill in the morning for first few days. Continue Adapalene 0.3% gel once daily to face and back Cont Clindamycin solution once daily to face and back  Start Triamcinolone 1% cream 2 times a day for up to 2 weeks to affected area of eczema on back, avoid face, groin, axilla  Start Lamisil cream 2 times a week until rash clear then continue for one more week to rash on right underside chin  Due to recent changes in healthcare laws, you may see results of your pathology and/or laboratory studies on MyChart before the doctors have had a chance to review them. We understand that in some cases there may be results that are confusing or concerning to you. Please understand that not all results are received at the same time and often the doctors may need to interpret multiple results in order to provide you with the best plan of care or course of treatment. Therefore, we ask that you please give [place] 2 business days to thoroughly review all your results before contacting the office for clarification. Should we see a critical lab result, you will be contacted sooner.   If You Need Anything After Your Visit  If you have any questions or concerns for your doctor, please call our main line at [phone] and press option 4 to reach your doctor's medical assistant. If no one answers, please leave a voicemail as directed and we will return your call as soon as possible. Messages left after 4 pm will be answered the following business day.   You may also send [place] a message via [place]. We typically respond to MyChart messages within 1-2 business days.  For prescription refills, please ask your pharmacy to contact our office. Our fax number is [phone].  If you have an urgent issue when the clinic is closed that cannot wait until the next business day, you can page  your doctor at the number below.    Please note that while we do our best to be available for urgent issues outside of office hours, we are not available 24/7.   If you have an urgent issue and are unable to reach [place], you may choose to seek medical care at your doctor's office, retail clinic, urgent care center, or emergency room.  If you have a medical emergency, please immediately call 911 or go to the emergency department.  Pager Numbers  - Dr. Nehemiah Massed: [phone]  - Dr. Laurence Ferrari: [phone]  - Dr. Nicole Kindred: [phone]  In the event of inclement weather, please call our main line at [phone] for an update on the status of any delays or closures.  Dermatology Medication Tips: Please keep the boxes that topical medications come in in order to help keep track of the instructions about where and how to use these. Pharmacies typically print the medication instructions only on the boxes and not directly on the medication tubes.   If your medication is too expensive, please contact our office at [phone] option 4 or send [place] a message through [place].   We are unable to tell what your co-pay for medications will be in advance as this is different depending on your insurance coverage. However, we may be able to find a substitute medication at lower cost or fill out paperwork to get insurance to cover a needed medication.   If a prior authorization is  required to get your medication covered by your insurance company, please allow [place] 1-2 business days to complete this process.  Drug prices often vary depending on where the prescription is filled and some pharmacies may offer cheaper prices.  The website www.goodrx.com contains coupons for medications through different pharmacies. The prices here do not account for what the cost may be with help from insurance (it may be cheaper with your insurance), but the website can give you the price if you did not use any insurance.  - You can  print the associated coupon and take it with your prescription to the pharmacy.  - You may also stop by our office during regular business hours and pick up a GoodRx coupon card.  - If you need your prescription sent electronically to a different pharmacy, notify our office through [facility] or by phone at [phone] option 4.     Si Usted Necesita Algo Despus de Su Visita  Tambin puede enviarnos un mensaje a travs de Pharmacist, community. Por lo general respondemos a los mensajes de MyChart en el transcurso de 1 a 2 das hbiles.  Para renovar recetas, por favor pida a su farmacia que se ponga en contacto con nuestra oficina. Harland Dingwall de fax es [place] [phone].  Si tiene un asunto urgente cuando la clnica est cerrada y que no puede esperar hasta el siguiente da hbil, puede llamar/localizar a su doctor(a) al nmero que aparece a continuacin.   Por favor, tenga en cuenta que aunque hacemos todo lo posible para estar disponibles para asuntos urgentes fuera del horario de [place], no estamos disponibles las 24 horas del da, los 7 das de la [place].   Si tiene un problema urgente y no puede comunicarse con nosotros, puede optar por buscar atencin mdica  en el consultorio de su doctor(a), en una clnica privada, en un centro de atencin urgente o en una sala de emergencias.  Si tiene Engineering geologist, por favor llame inmediatamente al 911 o vaya a la sala de emergencias.  Nmeros de bper  - Dr. Nehemiah Massed: [phone]  - Dra. Moye: [phone]  - Dra. Nicole Kindred: [phone]  En caso de inclemencias del [place], por favor llame a Johnsie Kindred principal al [phone] para una actualizacin sobre el [place] de cualquier retraso o cierre.  Consejos para la medicacin en dermatologa: Por favor, guarde las cajas en las que vienen los medicamentos de uso tpico para ayudarle a seguir las instrucciones sobre dnde y cmo usarlos. Las farmacias generalmente imprimen las  instrucciones del medicamento slo en las cajas y no directamente en los tubos del [place].   Si su medicamento es muy caro, por favor, pngase en contacto con Zigmund Daniel llamando al [phone] y presione la opcin 4 o envenos un mensaje a travs de Pharmacist, community.   No podemos decirle cul ser su copago por los medicamentos por adelantado ya que esto es diferente dependiendo de la cobertura de su seguro. Sin embargo, es posible que podamos encontrar un medicamento sustituto a Electrical engineer un formulario para que el seguro cubra el medicamento que se considera necesario.   Si se requiere una autorizacin previa para que su compaa de seguros [place] su medicamento, por favor permtanos de 1 a 2 das hbiles para completar este proceso.  Los precios de los medicamentos varan con frecuencia dependiendo del Environmental consultant de dnde se surte la receta y alguna farmacias pueden ofrecer precios ms baratos.  El sitio web www.goodrx.com tiene cupones para medicamentos de  diferentes farmacias. Los precios aqu no tienen en cuenta lo que podra costar con la ayuda del seguro (puede ser ms barato con su seguro), pero el sitio web puede darle el precio si no utiliz Research scientist (physical sciences).  - Puede imprimir el cupn correspondiente y llevarlo con su receta a la farmacia.  - Tambin puede pasar por nuestra oficina durante el horario de atencin regular y Charity fundraiser una tarjeta de cupones de GoodRx.  - Si necesita que su receta se enve electrnicamente a una farmacia diferente, informe a nuestra oficina a travs de MyChart de [place] o por telfono llamando al [phone] y presione la opcin 4.

## 2022-07-25 ENCOUNTER — Telehealth: Payer: Self-pay

## 2022-07-25 NOTE — Telephone Encounter (Signed)
Patient came in today for BP check: 119/72.  She was delayed starting higher dose of Spironolactone and will have Potassium checked once she has been taking it a full three weeks.

## 2022-07-25 NOTE — Telephone Encounter (Signed)
Thank you. Ok to continue at current dose until we get potassium results.

## 2022-07-26 ENCOUNTER — Encounter: Payer: Self-pay | Admitting: Dermatology

## 2022-07-27 NOTE — Telephone Encounter (Signed)
I would recommend checking in clinic next week as this needs a scraping to check for fungus to confirm if it is fungus again or if it is an eczema or allergic type rash. Ok to use the antifungal cream twice a day until then and cancel appointment if it clears the rash.

## 2022-07-27 NOTE — Telephone Encounter (Signed)
Called patient. Advised to continue with current dosage until we receive potassium results.

## 2022-08-15 ENCOUNTER — Other Ambulatory Visit: Payer: Self-pay | Admitting: Dermatology

## 2022-08-16 LAB — POTASSIUM: Potassium: 4.6 mmol/L (ref 3.5–5.2)

## 2022-08-17 ENCOUNTER — Ambulatory Visit: Payer: Managed Care, Other (non HMO) | Admitting: Dermatology

## 2022-08-17 DIAGNOSIS — L700 Acne vulgaris: Secondary | ICD-10-CM | POA: Diagnosis not present

## 2022-08-17 DIAGNOSIS — R21 Rash and other nonspecific skin eruption: Secondary | ICD-10-CM | POA: Diagnosis not present

## 2022-08-17 DIAGNOSIS — L309 Dermatitis, unspecified: Secondary | ICD-10-CM | POA: Diagnosis not present

## 2022-08-17 MED ORDER — SPIRONOLACTONE 25 MG PO TABS: 25.0000 mg | ORAL_TABLET | Freq: Every morning | ORAL | 6 refills | Status: DC

## 2022-08-17 MED ORDER — HYDROCORTISONE 2.5 % EX CREA: TOPICAL_CREAM | Freq: Two times a day (BID) | CUTANEOUS | 0 refills | Status: DC | PRN

## 2022-08-17 MED ORDER — KETOCONAZOLE 2 % EX CREA: 1.0000 | TOPICAL_CREAM | Freq: Two times a day (BID) | CUTANEOUS | 2 refills | Status: AC

## 2022-08-17 NOTE — Progress Notes (Signed)
   Follow-Up Visit   Subjective  Ebony Lawson is a 37 y.o. female who presents for the following: Rash at neck  Rash at face and neck, itchy. Rash at neck cleared with terbinafine but new discoloration and then itch appeared beginning of March. She has been using lamisil cream.   The following portions of the chart were reviewed this encounter and updated as appropriate: medications, allergies, medical history  Review of Systems:  No other skin or systemic complaints except as noted in HPI or Assessment and Plan.  Objective  Well appearing patient in no apparent distress; mood and affect are within normal limits.  Areas Examined: Face, neck    Assessment & Plan    Eczema/dermatitis Chronic and persistent condition with duration or expected duration over one year. Condition is bothersome/symptomatic for patient. Currently flared. - hold clindamycin and adapalene. Stop terbinafine cream.  - start TMC twice a day for up to 2 weeks to neck and chest.  - for face start hydrocortisone 2.5% cream twice a day for up to 2 weeks to face until rash clear.   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 Currently well-controlled. - Cont spironolactone 100 mg nightly and 25 mg qam. Potassium wnl. - hold clindamycin and adapalene until rash clears - BP 111/86  Return in about 4 weeks (around 09/14/2022) for 4-6 weeks for rash f/u.  I,VIRGINA Eldar Robitaille,acting as a Education administrator for [employer], MD.,have documented all relevant documentation on the behalf of Forest Gleason, MD,as directed by  Forest Gleason, MD while in the presence of Forest Gleason, MD.  Documentation: I have reviewed the above documentation for accuracy and completeness, and I  agree with the above.  Forest Gleason, MD

## 2022-08-21 ENCOUNTER — Encounter: Payer: Self-pay | Admitting: Dermatology

## 2022-09-27 ENCOUNTER — Ambulatory Visit: Payer: Managed Care, Other (non HMO) | Admitting: Dermatology

## 2022-09-27 VITALS — BP 112/70 | HR 87

## 2022-09-27 DIAGNOSIS — D229 Melanocytic nevi, unspecified: Secondary | ICD-10-CM

## 2022-09-27 DIAGNOSIS — L539 Erythematous condition, unspecified: Secondary | ICD-10-CM

## 2022-09-27 DIAGNOSIS — R21 Rash and other nonspecific skin eruption: Secondary | ICD-10-CM | POA: Diagnosis not present

## 2022-09-27 DIAGNOSIS — L700 Acne vulgaris: Secondary | ICD-10-CM | POA: Diagnosis not present

## 2022-09-27 MED ORDER — CLOBETASOL PROPIONATE 0.05 % EX CREA: TOPICAL_CREAM | CUTANEOUS | 0 refills | Status: DC

## 2022-09-27 NOTE — Progress Notes (Signed)
   Follow-Up Visit   Subjective  Ebony Lawson is a 37 y.o. female who presents for the following: Recheck rash at the face and chest - improved with TMC and HC, but still itching at the neck. Patient did restart the Adapalene but hasn't restarted the Clindamycin for acne. She has itchy bumps at the L knee and thinks they may be bug bites.  The following portions of the chart were reviewed this encounter and updated as appropriate: medications, allergies, medical history  Review of Systems:  No other skin or systemic complaints except as noted in HPI or Assessment and Plan.  Objective  Well appearing patient in no apparent distress; mood and affect are within normal limits.  A focused examination was performed of the following areas:   Relevant exam findings are noted in the Assessment and Plan.    Assessment & Plan   ACNE VULGARIS Exam: Few inflammatory papules at the face.   Chronic condition with duration or expected duration over one year. Not currently at goal/with side effect (rash) of treatment.  - Cont spironolactone 100 mg nightly and 25 mg qam. Potassium wnl. - Continue Adapalene 0.3% cream QAM  - Restart Clindamycin QAM  - Start OTC Argon oil QHS after washing face  Consider Aklief cream in the future if rash recurs  RASH AT CHEST Ddx acne with irritant dermatitis vs bug bite Exam: Erythematous edematous papules at chest  Treatment Plan: Start Clobetasol 0.05% cream BID up to two weeks PRN. Topical steroids (such as triamcinolone, fluocinolone, fluocinonide, mometasone, clobetasol, halobetasol, betamethasone, hydrocortisone) can cause thinning and lightening of the skin if they are used for too long in the same area. Your physician has selected the right strength medicine for your problem and area affected on the body. Please use your medication only as directed by your physician to prevent side effects.   Pt to contact clinic if not improved by two weeks.   BITE  REACTION/PAPULAR URTICARIA  Exam: L knee with erythematous edematous papules.  Treatment Plan: Treatment Plan: Start Clobetasol 0.05% cream BID up to two weeks PRN. Topical steroids (such as triamcinolone, fluocinolone, fluocinonide, mometasone, clobetasol, halobetasol, betamethasone, hydrocortisone) can cause thinning and lightening of the skin if they are used for too long in the same area. Your physician has selected the right strength medicine for your problem and area affected on the body. Please use your medication only as directed by your physician to prevent side effects.   MELANOCYTIC NEVUS Exam: L upper back 0.4 cm tan erythematous papule without features suspicious for malignancy on dermoscopy   Treatment Plan: Benign appearing on exam today. Recommend observation. Call clinic for new or changing moles. Recommend daily use of broad spectrum spf 30+ sunscreen to sun-exposed areas.    Return for appointment as scheduled.  Maylene Roes, CMA, am acting as scribe for Darden Dates, MD .  Documentation: I have reviewed the above documentation for accuracy and completeness, and I agree with the above.  Darden Dates, MD

## 2022-09-27 NOTE — Patient Instructions (Signed)
Due to recent changes in healthcare laws, you may see results of your pathology and/or laboratory studies on MyChart before the doctors have had a chance to review them. We understand that in some cases there may be results that are confusing or concerning to you. Please understand that not all results are received at the same time and often the doctors may need to interpret multiple results in order to provide you with the best plan of care or course of treatment. Therefore, we ask that you please give us 2 business days to thoroughly review all your results before contacting the office for clarification. Should we see a critical lab result, you will be contacted sooner.   If You Need Anything After Your Visit  If you have any questions or concerns for your doctor, please call our main line at [phone] and press option 4 to reach your doctor's medical assistant. If no one answers, please leave a voicemail as directed and we will return your call as soon as possible. Messages left after 4 pm will be answered the following business day.   You may also send us a message via MyChart. We typically respond to MyChart messages within 1-2 business days.  For prescription refills, please ask your pharmacy to contact our office. Our fax number is [phone].  If you have an urgent issue when the clinic is closed that cannot wait until the next business day, you can page your doctor at the number below.    Please note that while we do our best to be available for urgent issues outside of office hours, we are not available 24/7.   If you have an urgent issue and are unable to reach us, you may choose to seek medical care at your doctor's office, retail clinic, urgent care center, or emergency room.  If you have a medical emergency, please immediately call 911 or go to the emergency department.  Pager Numbers  - Dr. Kowalski: [phone]  - Dr. Moye: [phone]  - Dr. Stewart:  [phone]  In the event of inclement weather, please call our main line at [phone] for an update on the status of any delays or closures.  Dermatology Medication Tips: Please keep the boxes that topical medications come in in order to help keep track of the instructions about where and how to use these. Pharmacies typically print the medication instructions only on the boxes and not directly on the medication tubes.   If your medication is too expensive, please contact our office at [phone] option 4 or send us a message through MyChart.   We are unable to tell what your co-pay for medications will be in advance as this is different depending on your insurance coverage. However, we may be able to find a substitute medication at lower cost or fill out paperwork to get insurance to cover a needed medication.   If a prior authorization is required to get your medication covered by your insurance company, please allow us 1-2 business days to complete this process.  Drug prices often vary depending on where the prescription is filled and some pharmacies may offer cheaper prices.  The website www.goodrx.com contains coupons for medications through different pharmacies. The prices here do not account for what the cost may be with help from insurance (it may be cheaper with your insurance), but the website can give you the price if you did not use any insurance.  - You can print the associated coupon and take it with   your prescription to the pharmacy.  - You may also stop by our office during regular business hours and pick up a GoodRx coupon card.  - If you need your prescription sent electronically to a different pharmacy, notify our office through [place] MyChart or by phone at [phone] option 4.     Si Usted Necesita Algo Despus de Su Visita  Tambin puede enviarnos un mensaje a travs de MyChart. Por lo general respondemos a los mensajes de MyChart en el transcurso de 1 a 2  das hbiles.  Para renovar recetas, por favor pida a su farmacia que se ponga en contacto con nuestra oficina. Nuestro nmero de fax es el [phone].  Si tiene un asunto urgente cuando la clnica est cerrada y que no puede esperar hasta el siguiente da hbil, puede llamar/localizar a su doctor(a) al nmero que aparece a continuacin.   Por favor, tenga en cuenta que aunque hacemos todo lo posible para estar disponibles para asuntos urgentes fuera del horario de oficina, no estamos disponibles las 24 horas del da, los 7 das de la semana.   Si tiene un problema urgente y no puede comunicarse con nosotros, puede optar por buscar atencin mdica  en el consultorio de su doctor(a), en una clnica privada, en un centro de atencin urgente o en una sala de emergencias.  Si tiene una emergencia mdica, por favor llame inmediatamente al 911 o vaya a la sala de emergencias.  Nmeros de bper  - Dr. Kowalski: [phone]  - Dra. Moye: [phone]  - Dra. Stewart: [phone]  En caso de inclemencias del tiempo, por favor llame a nuestra lnea principal al [phone] para una actualizacin sobre el estado de cualquier retraso o cierre.  Consejos para la medicacin en dermatologa: Por favor, guarde las cajas en las que vienen los medicamentos de uso tpico para ayudarle a seguir las instrucciones sobre dnde y cmo usarlos. Las farmacias generalmente imprimen las instrucciones del medicamento slo en las cajas y no directamente en los tubos del medicamento.   Si su medicamento es muy caro, por favor, pngase en contacto con nuestra oficina llamando al [phone] y presione la opcin 4 o envenos un mensaje a travs de MyChart.   No podemos decirle cul ser su copago por los medicamentos por adelantado ya que esto es diferente dependiendo de la cobertura de su seguro. Sin embargo, es posible que podamos encontrar un medicamento sustituto a menor costo o llenar un formulario para que el  seguro cubra el medicamento que se considera necesario.   Si se requiere una autorizacin previa para que su compaa de seguros cubra su medicamento, por favor permtanos de 1 a 2 das hbiles para completar este proceso.  Los precios de los medicamentos varan con frecuencia dependiendo del lugar de dnde se surte la receta y alguna farmacias pueden ofrecer precios ms baratos.  El sitio web www.goodrx.com tiene cupones para medicamentos de diferentes farmacias. Los precios aqu no tienen en cuenta lo que podra costar con la ayuda del seguro (puede ser ms barato con su seguro), pero el sitio web puede darle el precio si no utiliz ningn seguro.  - Puede imprimir el cupn correspondiente y llevarlo con su receta a la farmacia.  - Tambin puede pasar por nuestra oficina durante el horario de atencin regular y recoger una tarjeta de cupones de GoodRx.  - Si necesita que su receta se enve electrnicamente a una farmacia diferente, informe a nuestra oficina a travs de MyChart de [place]   o por telfono llamando al [phone] y presione la opcin 4.  

## 2022-11-09 ENCOUNTER — Encounter: Payer: Self-pay | Admitting: Nurse Practitioner

## 2022-11-09 DIAGNOSIS — E782 Mixed hyperlipidemia: Secondary | ICD-10-CM

## 2022-11-09 DIAGNOSIS — E559 Vitamin D deficiency, unspecified: Secondary | ICD-10-CM

## 2022-11-09 DIAGNOSIS — E538 Deficiency of other specified B group vitamins: Secondary | ICD-10-CM

## 2022-11-09 DIAGNOSIS — D508 Other iron deficiency anemias: Secondary | ICD-10-CM

## 2022-11-14 LAB — B12 AND FOLATE PANEL
Folate: 15.6 ng/mL (ref >3.0)
Vitamin B-12: 1103 pg/mL (ref 232–1245)

## 2022-11-14 LAB — CBC WITH DIFFERENTIAL/PLATELET
Basophils Absolute: 0 10*3/uL (ref 0.0–0.2)
Basos: 1 %
EOS (ABSOLUTE): 0.1 10*3/uL (ref 0.0–0.4)
Eos: 2 %
Hematocrit: 44.5 % (ref 34.0–46.6)
Hemoglobin: 14.5 g/dL (ref 11.1–15.9)
Immature Grans (Abs): 0 10*3/uL (ref 0.0–0.1)
Immature Granulocytes: 0 %
Lymphocytes Absolute: 2.2 10*3/uL (ref 0.7–3.1)
Lymphs: 29 %
MCH: 28.1 pg (ref 26.6–33.0)
MCHC: 32.6 g/dL (ref 31.5–35.7)
MCV: 86 fL (ref 79–97)
Monocytes Absolute: 0.5 10*3/uL (ref 0.1–0.9)
Monocytes: 6 %
Neutrophils Absolute: 4.6 10*3/uL (ref 1.4–7.0)
Neutrophils: 62 %
Platelets: 218 10*3/uL (ref 150–450)
RBC: 5.16 x10E6/uL (ref 3.77–5.28)
RDW: 13 % (ref 11.7–15.4)
WBC: 7.4 10*3/uL (ref 3.4–10.8)

## 2022-11-14 LAB — LIPID PANEL
Chol/HDL Ratio: 4 ratio (ref 0.0–4.4)
Cholesterol, Total: 173 mg/dL (ref 100–199)
HDL: 43 mg/dL (ref >39)
LDL Chol Calc (NIH): 105 mg/dL — ABNORMAL HIGH (ref 0–99)
Triglycerides: 140 mg/dL (ref 0–149)
VLDL Cholesterol Cal: 25 mg/dL (ref 5–40)

## 2022-11-14 LAB — VITAMIN D 25 HYDROXY (VIT D DEFICIENCY, FRACTURES): Vit D, 25-Hydroxy: 42.5 ng/mL (ref 30.0–100.0)

## 2022-11-14 LAB — IRON,TIBC AND FERRITIN PANEL
Ferritin: 43 ng/mL (ref 15–150)
Iron Saturation: 23 % (ref 15–55)
Iron: 70 ug/dL (ref 27–159)
Total Iron Binding Capacity: 309 ug/dL (ref 250–450)
UIBC: 239 ug/dL (ref 131–425)

## 2022-11-20 ENCOUNTER — Encounter: Payer: Self-pay | Admitting: Nurse Practitioner

## 2022-11-20 ENCOUNTER — Ambulatory Visit: Payer: Managed Care, Other (non HMO) | Admitting: Nurse Practitioner

## 2022-11-20 VITALS — BP 110/76 | HR 86 | Temp 98.3°F | Resp 16 | Ht 60.0 in | Wt 118.4 lb

## 2022-11-20 DIAGNOSIS — R42 Dizziness and giddiness: Secondary | ICD-10-CM | POA: Diagnosis not present

## 2022-11-20 DIAGNOSIS — K5904 Chronic idiopathic constipation: Secondary | ICD-10-CM | POA: Diagnosis not present

## 2022-11-20 DIAGNOSIS — D508 Other iron deficiency anemias: Secondary | ICD-10-CM | POA: Diagnosis not present

## 2022-11-20 DIAGNOSIS — F411 Generalized anxiety disorder: Secondary | ICD-10-CM | POA: Diagnosis not present

## 2022-11-20 MED ORDER — FERRALET 90 90-1 MG PO TABS: 1.0000 | ORAL_TABLET | Freq: Every day | ORAL | 5 refills | Status: DC

## 2022-11-20 MED ORDER — HYDROXYZINE HCL 10 MG PO TABS: 10.0000 mg | ORAL_TABLET | Freq: Two times a day (BID) | ORAL | 1 refills | Status: DC | PRN

## 2022-11-20 MED ORDER — MECLIZINE HCL 12.5 MG PO TABS: 12.5000 mg | ORAL_TABLET | Freq: Three times a day (TID) | ORAL | 1 refills | Status: DC | PRN

## 2022-11-20 NOTE — Progress Notes (Signed)
[facility] [address] [place], [place] [address]  Internal MEDICINE  Office Visit Note  Patient Name: Ebony Lawson  [id number]  [id number]  Date of Service: 11/20/2022  Chief Complaint  Patient presents with   Follow-up    HPI Ebony Lawson presents for a follow-up visit for iron deficiency, anxiety and constipation  Iron deficiency --taking prescription supplement every other day. Her iron panel is normal and stable Anxiety -- takes hydroxyzine as needed  Constipation -- tried miralax which helped some, tried increasing water intake, makes her feel nauseated sometimes, feeling of incomplete emptying. Has not tried any OTC medications  Pap smear next year     Current Medication: Outpatient Encounter Medications as of 11/20/2022  Medication Sig   Adapalene (DIFFERIN) 0.3 % gel Apply pea size amount to entire face once every night   Calcium Carbonate-Vitamin D (CALCIUM 600+D3 PO) Take 1 tablet by mouth daily.   clindamycin (CLEOCIN T) 1 % external solution Apply topically See admin instructions. apply night before adapalene to face for acne   clobetasol cream (TEMOVATE) 0.05 % Apply to aa's BID up to two weeks. Avoid applying to face, groin, and axilla. Use as directed. Long-term use can cause thinning of the skin.   etonogestrel (NEXPLANON) 68 MG IMPL implant 1 each by Subdermal route once.   ketoconazole (NIZORAL) 2 % cream Apply 1 Application topically 2 (two) times daily. To rash at neck, chest and back   ondansetron (ZOFRAN-ODT) 4 MG disintegrating tablet Take 1 tablet (4 mg total) by mouth every 8 (eight) hours as needed for nausea or vomiting.   Polypodium Leucotomos (HELIOCARE PO) Take by mouth.   spironolactone (ALDACTONE) 100 MG tablet Take 1 tablet (100 mg total) by mouth every evening.   spironolactone (ALDACTONE) 25 MG tablet Take 1 tablet (25 mg total) by mouth in the morning.   VITAMIN D PO Take by mouth.   [DISCONTINUED] Fe Cbn-Fe Gluc-FA-B12-C-DSS  (FERRALET 90) 90-1 MG TABS Take 1 tablet by mouth daily.   [DISCONTINUED] hydrOXYzine (ATARAX) 10 MG tablet Take 1-2 tablets (10-20 mg total) by mouth 2 (two) times daily as needed for anxiety. For severe anxiety   [DISCONTINUED] meclizine (ANTIVERT) 12.5 MG tablet Take 1 tablet (12.5 mg total) by mouth 3 (three) times daily as needed for dizziness or nausea.   [DISCONTINUED] spironolactone (ALDACTONE) 100 MG tablet TAKE 1 TABLET(100 MG) BY MOUTH DAILY   Fe Cbn-Fe Gluc-FA-B12-C-DSS (FERRALET 90) 90-1 MG TABS Take 1 tablet by mouth daily.   hydrOXYzine (ATARAX) 10 MG tablet Take 1-2 tablets (10-20 mg total) by mouth 2 (two) times daily as needed for anxiety. For severe anxiety   meclizine (ANTIVERT) 12.5 MG tablet Take 1 tablet (12.5 mg total) by mouth 3 (three) times daily as needed for dizziness or nausea.   No facility-administered encounter medications on file as of 11/20/2022.    Surgical History: History reviewed. No pertinent surgical history.  Medical History: Past Medical History:  Diagnosis Date   Heart murmur    Migraine     Family History: Family History  Problem Relation Age of Onset   Hyperlipidemia Mother    Cancer Father        prostate and skin cancer   Hyperlipidemia Father    Heart disease Father        CAD - triple bypass 2014   Hypertension Father    Melanoma Father    Hearing loss Paternal Grandfather    Hypertension Paternal Grandfather    Anxiety  disorder Brother    Breast cancer Neg Hx     Social History   Socioeconomic History   Marital status: Single    Spouse name: Not on file   Number of children: 0   Years of education: 16   Highest education level: Bachelor's degree (e.g., BA, AB, BS)  Occupational History   Occupation: DNA Administrator, arts: [employer]  Tobacco Use   Smoking status: Never   Smokeless tobacco: Never  Vaping Use   Vaping Use: Never used  Substance and Sexual Activity   Alcohol use: Yes    Alcohol/week: 0.0  standard drinks of alcohol    Comment: 2-3 drinks monthly   Drug use: No   Sexual activity: Yes    Partners: Male    Birth control/protection: OCP    Comment: 1 partner   Other Topics Concern   Not on file  Social History Narrative   Ebony Lawson grew up outside of [place]. She attended [employer] and obtained a Tax adviser in [employer] in [place]. 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       Hobbies - plays trumpet   Exercise - none at present   Caffeine - rare    Social Determinants of Health   Financial Resource Strain: Low Risk  (02/15/2018)   Overall Financial Resource Strain (CARDIA)    Difficulty of Paying Living Expenses: Not hard at all  Food Insecurity: No Food Insecurity (02/15/2018)   Hunger Vital Sign    Worried About Running Out of Food in the Last Year: Never true    Ran Out of Food in the Last Year: Never true  Transportation Needs: No Transportation Needs (02/15/2018)   PRAPARE - Administrator, Civil Service (Medical): No    Lack of Transportation (Non-Medical): No  Physical Activity: Sufficiently Active (02/15/2018)   Exercise Vital Sign    Days of Exercise per Week: 4 days    Minutes of Exercise per Session: 50 min  Stress: Stress Concern Present (02/15/2018)   [employer] of Occupational Health - Occupational Stress Questionnaire    Feeling of Stress : Very much  Social Connections: Unknown (02/15/2018)   Social Connection and Isolation Panel [NHANES]    Frequency of Communication with Friends and Family: Not on file    Frequency of Social Gatherings with Friends and Family: Not on file    Attends Religious Services: More than 4 times per year    Active Member of [employer] or Organizations: Yes    Attends Banker Meetings: More than 4 times per year    Marital Status: Never married  Intimate Partner Violence: Not At Risk (02/15/2018)   Humiliation, Afraid, Rape, and Kick questionnaire    Fear of Current or Ex-Partner: No     Emotionally Abused: No    Physically Abused: No    Sexually Abused: No      Review of Systems  Constitutional:  Positive for fatigue. Negative for chills and unexpected weight change.  HENT:  Negative for congestion, rhinorrhea, sneezing and sore throat.   Eyes:  Negative for redness.  Respiratory: Negative.  Negative for cough, chest tightness, shortness of breath and wheezing.   Cardiovascular: Negative.  Negative for chest pain and palpitations.  Gastrointestinal:  Negative for abdominal pain, constipation, diarrhea, nausea and vomiting.  Endocrine: Positive for cold intolerance.  Genitourinary:  Negative for dysuria and frequency.  Musculoskeletal:  Negative for arthralgias, back pain, joint swelling  and neck pain.  Skin:  Negative for rash.  Neurological: Negative.  Negative for tremors and numbness.  Hematological:  Negative for adenopathy. Does not bruise/bleed easily.  Psychiatric/Behavioral:  Negative for behavioral problems (Depression), sleep disturbance and suicidal ideas. The patient is not nervous/anxious.     Vital Signs: BP 110/76   Pulse 86   Temp 98.3 F (36.8 C)   Resp 16   Ht 5' (1.524 m)   Wt 118 lb 6.4 oz (53.7 kg)   SpO2 97%   BMI 23.12 kg/m    Physical Exam Vitals reviewed.  Constitutional:      General: She is not in acute distress.    Appearance: Normal appearance. She is normal weight. She is not ill-appearing.  HENT:     Head: Normocephalic and atraumatic.  Eyes:     Pupils: Pupils are equal, round, and reactive to light.  Cardiovascular:     Rate and Rhythm: Normal rate and regular rhythm.  Pulmonary:     Effort: Pulmonary effort is normal. No respiratory distress.  Neurological:     Mental Status: She is alert and oriented to person, place, and time.  Psychiatric:        Mood and Affect: Mood normal.        Behavior: Behavior normal.        Assessment/Plan: 1. Chronic idiopathic constipation Discussed OTC medications and  interventions to try first. Sample of linzess given to patient as alternative option.   2. Other iron deficiency anemia Continue prescription supplement every other day - Fe Cbn-Fe Gluc-FA-B12-C-DSS (FERRALET 90) 90-1 MG TABS; Take 1 tablet by mouth daily.  Dispense: 30 tablet; Refill: 5  3. Dizziness Continue prn meclizine as prescribed  - meclizine (ANTIVERT) 12.5 MG tablet; Take 1 tablet (12.5 mg total) by mouth 3 (three) times daily as needed for dizziness or nausea.  Dispense: 90 tablet; Refill: 1  4. GAD (generalized anxiety disorder) Continue prn hydroxyzine as prescribed.  - hydrOXYzine (ATARAX) 10 MG tablet; Take 1-2 tablets (10-20 mg total) by mouth 2 (two) times daily as needed for anxiety. For severe anxiety  Dispense: 60 tablet; Refill: 1   General Counseling: Ebony Lawson verbalizes understanding of the findings of todays visit and agrees with plan of treatment. I have discussed any further diagnostic evaluation that may be needed or ordered today. We also reviewed her medications today. she has been encouraged to call the office with any questions or concerns that should arise related to todays visit.    No orders of the defined types were placed in this encounter.   Meds ordered this encounter  Medications   meclizine (ANTIVERT) 12.5 MG tablet    Sig: Take 1 tablet (12.5 mg total) by mouth 3 (three) times daily as needed for dizziness or nausea.    Dispense:  90 tablet    Refill:  1    For future refills, do not fill unless patient calls for it   hydrOXYzine (ATARAX) 10 MG tablet    Sig: Take 1-2 tablets (10-20 mg total) by mouth 2 (two) times daily as needed for anxiety. For severe anxiety    Dispense:  60 tablet    Refill:  1    For future refills, do not fill, patient will call if she needs it   Fe Cbn-Fe Gluc-FA-B12-C-DSS (FERRALET 90) 90-1 MG TABS    Sig: Take 1 tablet by mouth daily.    Dispense:  30 tablet    Refill:  5  Iron and B12 deficiency.    Return  for previously scheduled, CPE, Azam Gervasi PCP in december.   Total time spent:30 Minutes Time spent includes review of chart, medications, test results, and follow up plan with the patient.   [place] Controlled Substance Database was reviewed by me.  This patient was seen by Sallyanne Kuster, FNP-C in collaboration with Dr. Beverely Risen as a part of collaborative care agreement.   Ebony Morissette R. Tedd Sias, MSN, FNP-C Internal medicine

## 2022-12-10 ENCOUNTER — Encounter: Payer: Self-pay | Admitting: Dermatology

## 2022-12-19 ENCOUNTER — Ambulatory Visit: Payer: Managed Care, Other (non HMO) | Admitting: Dermatology

## 2022-12-19 VITALS — BP 113/74 | HR 85

## 2022-12-19 DIAGNOSIS — L814 Other melanin hyperpigmentation: Secondary | ICD-10-CM

## 2022-12-19 DIAGNOSIS — D225 Melanocytic nevi of trunk: Secondary | ICD-10-CM

## 2022-12-19 DIAGNOSIS — Z1283 Encounter for screening for malignant neoplasm of skin: Secondary | ICD-10-CM

## 2022-12-19 DIAGNOSIS — D2272 Melanocytic nevi of left lower limb, including hip: Secondary | ICD-10-CM

## 2022-12-19 DIAGNOSIS — D229 Melanocytic nevi, unspecified: Secondary | ICD-10-CM

## 2022-12-19 DIAGNOSIS — L700 Acne vulgaris: Secondary | ICD-10-CM | POA: Diagnosis not present

## 2022-12-19 DIAGNOSIS — L578 Other skin changes due to chronic exposure to nonionizing radiation: Secondary | ICD-10-CM

## 2022-12-19 DIAGNOSIS — W908XXA Exposure to other nonionizing radiation, initial encounter: Secondary | ICD-10-CM | POA: Diagnosis not present

## 2022-12-19 DIAGNOSIS — D2222 Melanocytic nevi of left ear and external auricular canal: Secondary | ICD-10-CM

## 2022-12-19 DIAGNOSIS — L821 Other seborrheic keratosis: Secondary | ICD-10-CM

## 2022-12-19 MED ORDER — WINLEVI 1 % EX CREA: TOPICAL_CREAM | CUTANEOUS | 5 refills | Status: DC

## 2022-12-19 NOTE — Progress Notes (Signed)
Follow-Up Visit   Subjective  Ebony Lawson is a 37 y.o. female who presents for the following: Skin Cancer Screening and Full Body Skin Exam  The patient presents for Total-Body Skin Exam (TBSE) for skin cancer screening and mole check. The patient has spots, moles and lesions to be evaluated, some may be new or changing. She has acne of the face, improved with Spironolactone 25mg  in the morning and 100 mg in the evening. She also uses adapalene 0.3% gel and clindamycin solution.  She still breaks out with cysts at jaw but it is some what better with spironolactone increase.   The following portions of the chart were reviewed this encounter and updated as appropriate: medications, allergies, medical history  Review of Systems:  No other skin or systemic complaints except as noted in HPI or Assessment and Plan.  Objective  Well appearing patient in no apparent distress; mood and affect are within normal limits.  A full examination was performed including scalp, head, eyes, ears, nose, lips, neck, chest, axillae, abdomen, back, buttocks, bilateral upper extremities, bilateral lower extremities, hands, feet, fingers, toes, fingernails, and toenails. All findings within normal limits unless otherwise noted below.   Relevant physical exam findings are noted in the Assessment and Plan.    Assessment & Plan   SKIN CANCER SCREENING PERFORMED TODAY.  ACTINIC DAMAGE - Chronic condition, secondary to cumulative UV/sun exposure - diffuse scaly erythematous macules with underlying dyspigmentation - Recommend daily broad spectrum sunscreen SPF 30+ to sun-exposed areas, reapply every 2 hours as needed.  - Staying in the shade or wearing long sleeves, sun glasses (UVA+UVB protection) and wide brim hats (4-inch brim around the entire circumference of the hat) are also recommended for sun protection.  - Call for new or changing lesions.  LENTIGINES, SEBORRHEIC KERATOSES, HEMANGIOMAS - Benign  normal skin lesions - Benign-appearing - Call for any changes  MELANOCYTIC NEVI - Tan-brown and/or pink-flesh-colored symmetric macules and papules   - 5 mm regular brown macule, left 2nd toe dorsum - L buttock with regular brown macules - 3.0 mm brown macule, L lateral heel  - 5.0 mm brown macule, L ear helix  - Benign appearing on exam today - Observation - Call clinic for new or changing moles - Recommend daily use of broad spectrum spf 30+ sunscreen to sun-exposed areas.   ACNE VULGARIS Exam: Cystic papule on the right jaw; violaceous macules on the left jaw  Chronic and persistent condition with duration or expected duration over one year. Condition is symptomatic/ bothersome to patient. Not currently at goal.   Treatment Plan: BP 113/74 Continue Spironolactone 25 MG 1 po qam and 100 MG 1 po at bedtime.  Start Winlevi cream Apply to face twice daily dsp 60 g 3Rf. Continue Clindamycin solution qam, adapalene 0.3% gel at bedtime with flares.  Spironolactone can cause increased urination and cause blood pressure to decrease. Please watch for signs of lightheadedness and be cautious when changing position. It can sometimes cause breast tenderness or an irregular period in premenopausal women. It can also increase potassium. The increase in potassium usually is not a concern unless you are taking other medicines that also increase potassium, so please be sure your doctor knows all of the other medications you are taking. This medication should not be taken by pregnant women.  This medicine should also not be taken together with sulfa drugs like Bactrim (trimethoprim/sulfamethexazole).     Return in about 6 months (around 06/21/2023) for Acne, also 1  year TBSE.  ICherlyn Labella, CMA, am acting as scribe for Willeen Niece, MD .   Documentation: I have reviewed the above documentation for accuracy and completeness, and I agree with the above.  Willeen Niece, MD

## 2022-12-19 NOTE — Patient Instructions (Addendum)
Your prescription was sent to [facility] in [place], [place]. You will receive a text message from [facility]. Clink the link to confirm your information. Enrollment only takes 2 minutes. Confirm your insurance. You will get the lowest possible price based on your coverage. Confirm your payment and delivery information. After your co-pay is verified, you will receive a text with a shipment tracking link. If for any reason you do not receive a text message from them, please reach out to them. Their phone number is [phone].       Due to recent changes in healthcare laws, you may see results of your pathology and/or laboratory studies on MyChart before the doctors have had a chance to review them. We understand that in some cases there may be results that are confusing or concerning to you. Please understand that not all results are received at the same time and often the doctors may need to interpret multiple results in order to provide you with the best plan of care or course of treatment. Therefore, we ask that you please give us 2 business days to thoroughly review all your results before contacting the office for clarification. Should we see a critical lab result, you will be contacted sooner.   If You Need Anything After Your Visit  If you have any questions or concerns for your doctor, please call our main line at [phone] and press option 4 to reach your doctor's medical assistant. If no one answers, please leave a voicemail as directed and we will return your call as soon as possible. Messages left after 4 pm will be answered the following business day.   You may also send us a message via MyChart. We typically respond to MyChart messages within 1-2 business days.  For prescription refills, please ask your pharmacy to contact our office. Our fax number is [phone].  If you have an urgent issue when the clinic is closed that cannot wait until the next business day, you can page your  doctor at the number below.    Please note that while we do our best to be available for urgent issues outside of office hours, we are not available 24/7.   If you have an urgent issue and are unable to reach us, you may choose to seek medical care at your doctor's office, retail clinic, urgent care center, or emergency room.  If you have a medical emergency, please immediately call 911 or go to the emergency department.  Pager Numbers  - Dr. Kowalski: [phone]  - Dr. Moye: [phone]  - Dr. Stewart: [phone]  In the event of inclement weather, please call our main line at [phone] for an update on the status of any delays or closures.  Dermatology Medication Tips: Please keep the boxes that topical medications come in in order to help keep track of the instructions about where and how to use these. Pharmacies typically print the medication instructions only on the boxes and not directly on the medication tubes.   If your medication is too expensive, please contact our office at [phone] option 4 or send us a message through MyChart.   We are unable to tell what your co-pay for medications will be in advance as this is different depending on your insurance coverage. However, we may be able to find a substitute medication at lower cost or fill out paperwork to get insurance to cover a needed medication.   If a prior authorization is required to get your medication covered   by your insurance company, please allow us 1-2 business days to complete this process.  Drug prices often vary depending on where the prescription is filled and some pharmacies may offer cheaper prices.  The website www.goodrx.com contains coupons for medications through different pharmacies. The prices here do not account for what the cost may be with help from insurance (it may be cheaper with your insurance), but the website can give you the price if you did not use any insurance.  - You can print  the associated coupon and take it with your prescription to the pharmacy.  - You may also stop by our office during regular business hours and pick up a GoodRx coupon card.  - If you need your prescription sent electronically to a different pharmacy, notify our office through [place] MyChart or by phone at [phone] option 4.     Si Usted Necesita Algo Despus de Su Visita  Tambin puede enviarnos un mensaje a travs de MyChart. Por lo general respondemos a los mensajes de MyChart en el transcurso de 1 a 2 das hbiles.  Para renovar recetas, por favor pida a su farmacia que se ponga en contacto con nuestra oficina. Nuestro nmero de fax es el [phone].  Si tiene un asunto urgente cuando la clnica est cerrada y que no puede esperar hasta el siguiente da hbil, puede llamar/localizar a su doctor(a) al nmero que aparece a continuacin.   Por favor, tenga en cuenta que aunque hacemos todo lo posible para estar disponibles para asuntos urgentes fuera del horario de oficina, no estamos disponibles las 24 horas del da, los 7 das de la semana.   Si tiene un problema urgente y no puede comunicarse con nosotros, puede optar por buscar atencin mdica  en el consultorio de su doctor(a), en una clnica privada, en un centro de atencin urgente o en una sala de emergencias.  Si tiene una emergencia mdica, por favor llame inmediatamente al 911 o vaya a la sala de emergencias.  Nmeros de bper  - Dr. Kowalski: [phone]  - Dra. Moye: [phone]  - Dra. Stewart: [phone]  En caso de inclemencias del tiempo, por favor llame a nuestra lnea principal al [phone] para una actualizacin sobre el estado de cualquier retraso o cierre.  Consejos para la medicacin en dermatologa: Por favor, guarde las cajas en las que vienen los medicamentos de uso tpico para ayudarle a seguir las instrucciones sobre dnde y cmo usarlos. Las farmacias generalmente imprimen las  instrucciones del medicamento slo en las cajas y no directamente en los tubos del medicamento.   Si su medicamento es muy caro, por favor, pngase en contacto con nuestra oficina llamando al [phone] y presione la opcin 4 o envenos un mensaje a travs de MyChart.   No podemos decirle cul ser su copago por los medicamentos por adelantado ya que esto es diferente dependiendo de la cobertura de su seguro. Sin embargo, es posible que podamos encontrar un medicamento sustituto a menor costo o llenar un formulario para que el seguro cubra el medicamento que se considera necesario.   Si se requiere una autorizacin previa para que su compaa de seguros cubra su medicamento, por favor permtanos de 1 a 2 das hbiles para completar este proceso.  Los precios de los medicamentos varan con frecuencia dependiendo del lugar de dnde se surte la receta y alguna farmacias pueden ofrecer precios ms baratos.  El sitio web www.goodrx.com tiene cupones para medicamentos de diferentes farmacias. Los precios aqu no   tienen en cuenta lo que podra costar con la ayuda del seguro (puede ser ms barato con su seguro), pero el sitio web puede darle el precio si no utiliz ningn seguro.  - Puede imprimir el cupn correspondiente y llevarlo con su receta a la farmacia.  - Tambin puede pasar por nuestra oficina durante el horario de atencin regular y recoger una tarjeta de cupones de GoodRx.  - Si necesita que su receta se enve electrnicamente a una farmacia diferente, informe a nuestra oficina a travs de MyChart de [facility] o por telfono llamando al [phone] y presione la opcin 4.  

## 2023-01-04 ENCOUNTER — Encounter: Payer: Managed Care, Other (non HMO) | Admitting: Dermatology

## 2023-01-29 ENCOUNTER — Other Ambulatory Visit: Payer: Self-pay | Admitting: Dermatology

## 2023-01-29 DIAGNOSIS — L700 Acne vulgaris: Secondary | ICD-10-CM

## 2023-02-02 ENCOUNTER — Encounter: Payer: Self-pay | Admitting: Nurse Practitioner

## 2023-03-12 ENCOUNTER — Encounter: Payer: Self-pay | Admitting: Dermatology

## 2023-03-12 ENCOUNTER — Other Ambulatory Visit: Payer: Self-pay

## 2023-03-12 ENCOUNTER — Other Ambulatory Visit: Payer: Self-pay | Admitting: Dermatology

## 2023-03-12 DIAGNOSIS — L700 Acne vulgaris: Secondary | ICD-10-CM

## 2023-03-12 MED ORDER — SPIRONOLACTONE 25 MG PO TABS: 25.0000 mg | ORAL_TABLET | Freq: Every morning | ORAL | 1 refills | Status: DC

## 2023-03-12 NOTE — Progress Notes (Signed)
Pt requested 90 day supply/sh

## 2023-05-16 ENCOUNTER — Telehealth: Payer: Self-pay | Admitting: Nurse Practitioner

## 2023-05-16 NOTE — Telephone Encounter (Signed)
Returned patients call to r/s 05/28/23 appointment. No answer. Lvm-[name]

## 2023-05-28 ENCOUNTER — Encounter: Payer: Managed Care, Other (non HMO) | Admitting: Nurse Practitioner

## 2023-06-07 ENCOUNTER — Encounter: Payer: Self-pay | Admitting: Nurse Practitioner

## 2023-06-09 IMAGING — MR MR HEAD W/O CM
11 series · 48 of 48 positions shown · non-contrast
Comparison: None.

CLINICAL DATA: Dizziness/vertigo.  Nausea.

EXAM:
MRI HEAD WITHOUT CONTRAST
TECHNIQUE: Multiplanar, multiecho pulse sequences of the brain and surrounding
structures were obtained without intravenous contrast.

[Series 5: ax dwi_tracew · axial · 3.0mm · 0.65mm/px · z∈[-141,+9]mm · 4 of 48 slices shown]
[im 1/48]
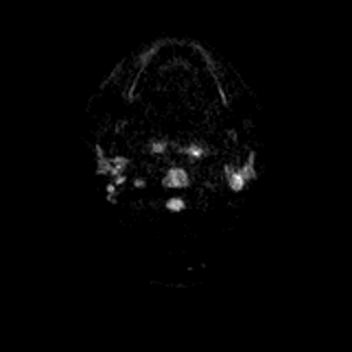
[im 16/48]
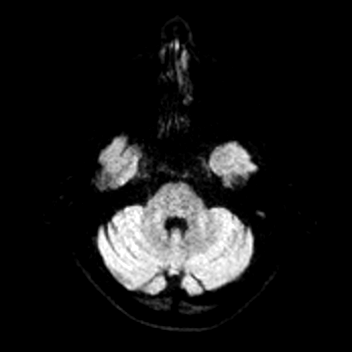
[im 32/48]
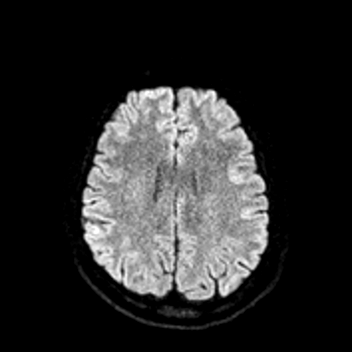
[im 48/48]
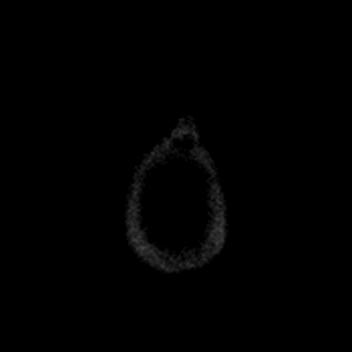

[Series 6: ax dwi_adc · axial · 3.0mm · 0.65mm/px · z∈[-141,+9]mm · 4 of 48 slices shown]
[im 1/48]
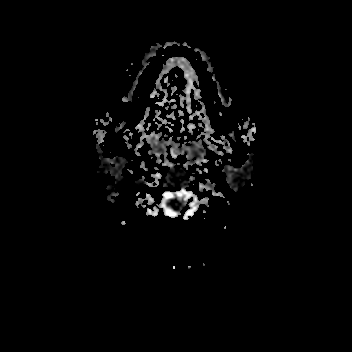
[im 16/48]
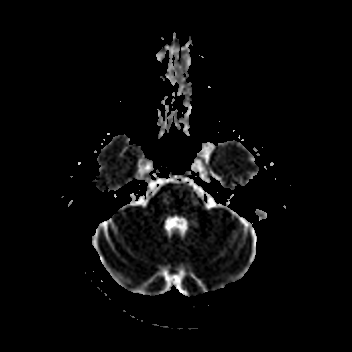
[im 32/48]
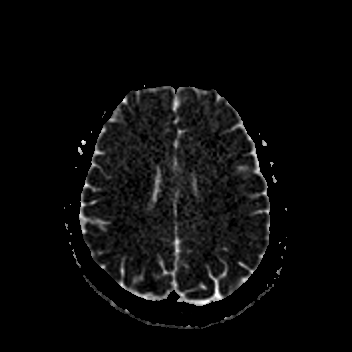
[im 48/48]
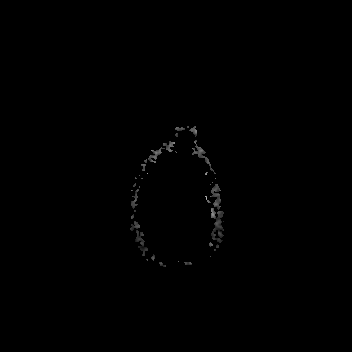

[Series 7: cor dwi_tracew · coronal · 5.0mm · 0.65mm/px · 3 of 38 slices shown]
[im 1/38]
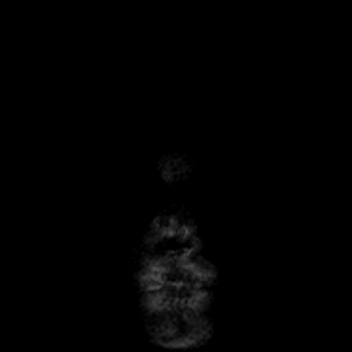
[im 19/38]
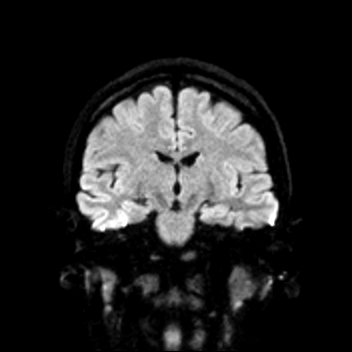
[im 38/38]
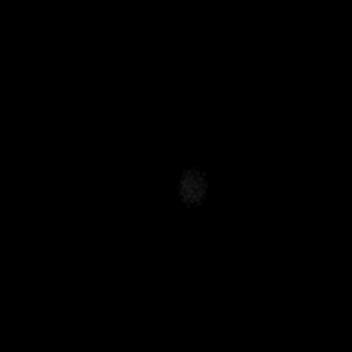

[Series 8: cor dwi_adc · coronal · 5.0mm · 0.65mm/px · 3 of 36 slices shown]
[im 1/36]
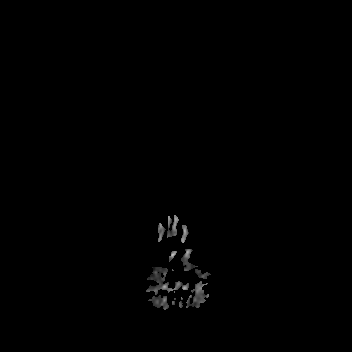
[im 18/36]
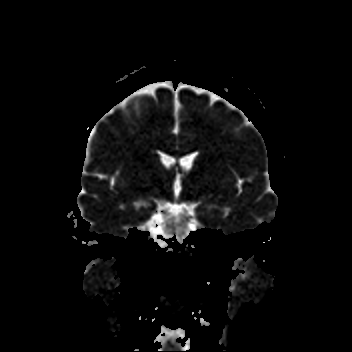
[im 36/36]
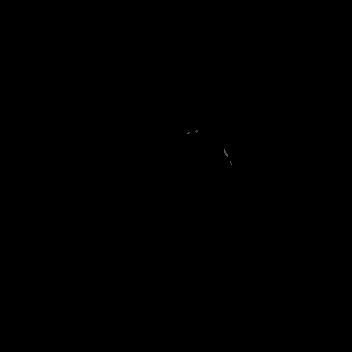

[Series 9: T1 · sagittal · 5.0mm · 0.62mm/px · 2 of 21 slices shown (1 of 2)]
[im 1/21]
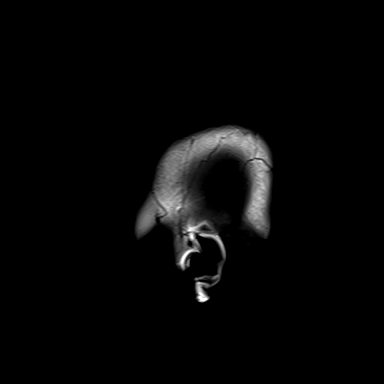
[im 21/21]
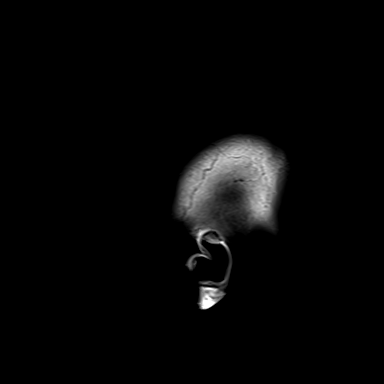

[Series 10: FLAIR · axial · 3.0mm · 0.53mm/px · z∈[-133,+24]mm · 4 of 55 slices shown]
[im 1/55]
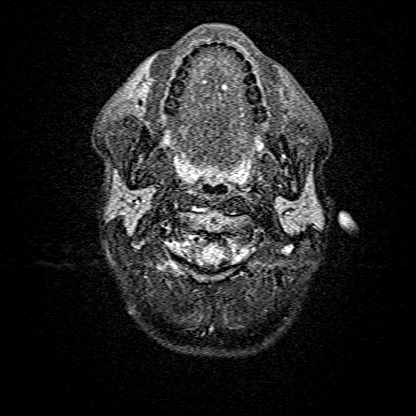
[im 19/55]
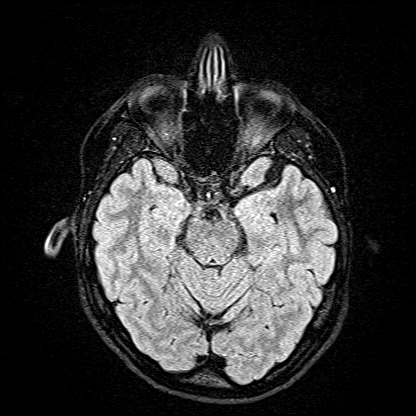
[im 37/55]
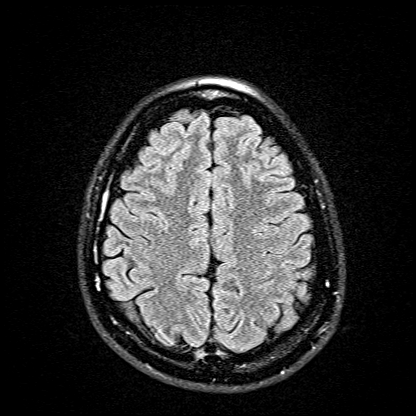
[im 55/55]
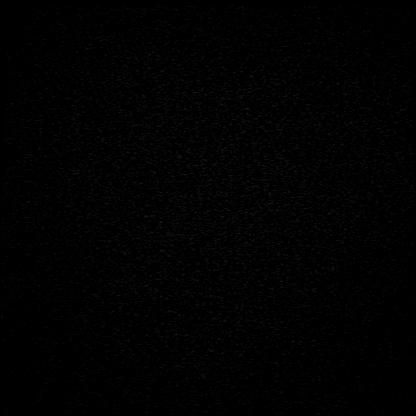

[Series 11: T2 · axial · 5.0mm · 0.53mm/px · z∈[-127,+19]mm · 2 of 26 slices shown (1 of 2)]
[im 1/26]
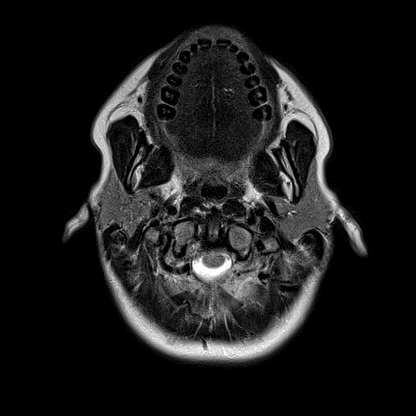
[im 26/26]
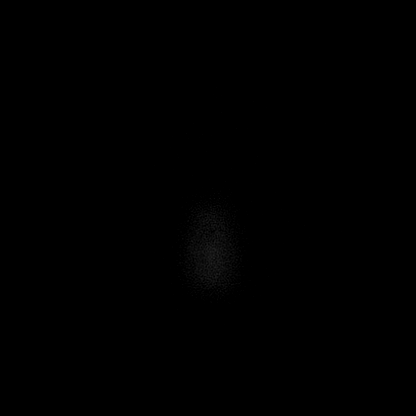

[Series 13: pha_images · axial · 3.0mm · 0.90mm/px · z∈[-141,+22]mm · 5 of 56 slices shown]
[im 1/56]
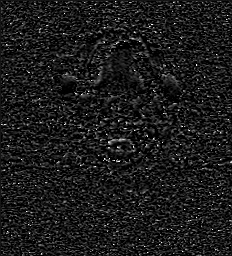
[im 14/56]
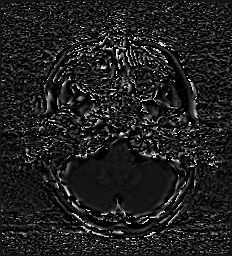
[im 28/56]
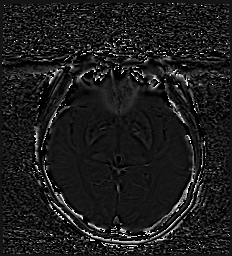
[im 42/56]
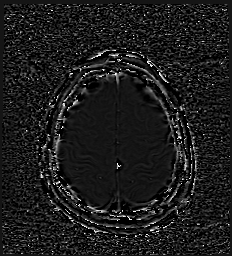
[im 56/56]
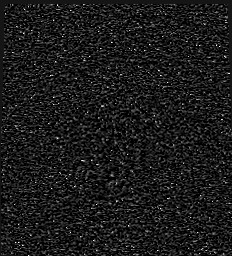

[Series 14: swi_images · axial · 3.0mm · 0.90mm/px · z∈[-141,+31]mm · 5 of 60 slices shown]
[im 1/60]
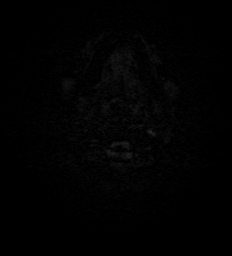
[im 15/60]
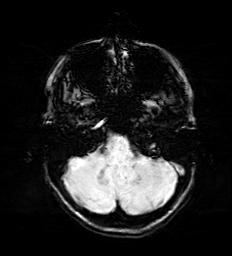
[im 30/60]
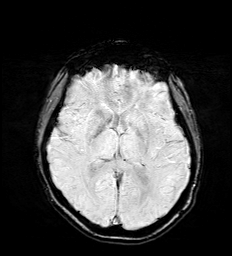
[im 45/60]
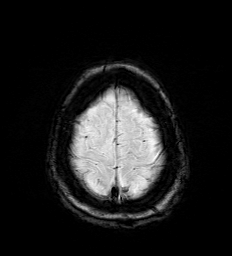
[im 60/60]
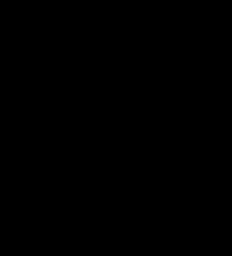

[Series 16: T1 · axial · 1.0mm · 0.98mm/px · z∈[-143,+26]mm · 14 of 174 slices shown (2 of 2)]
[im 1/174]
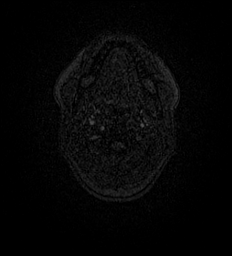
[im 14/174]
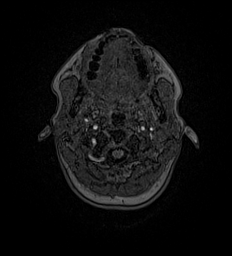
[im 27/174]
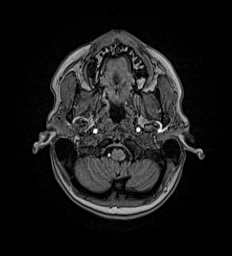
[im 40/174]
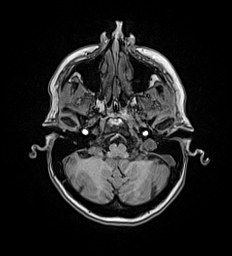
[im 54/174]
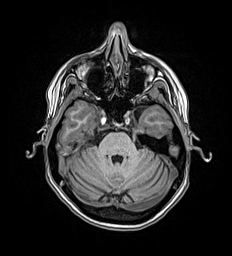
[im 67/174]
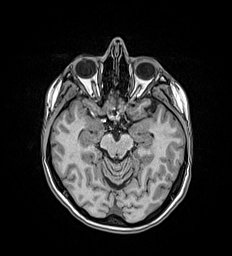
[im 80/174]
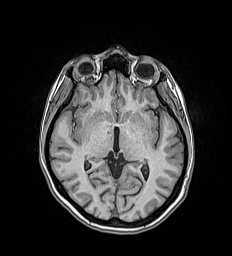
[im 94/174]
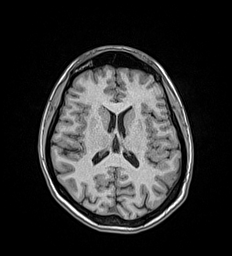
[im 107/174]
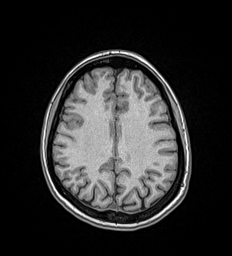
[im 120/174]
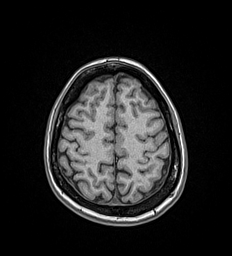
[im 134/174]
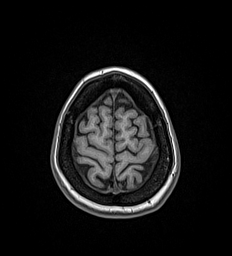
[im 147/174]
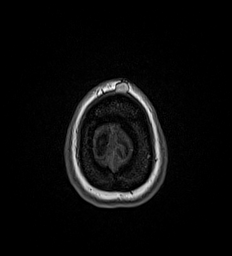
[im 160/174]
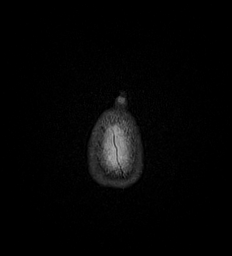
[im 174/174]
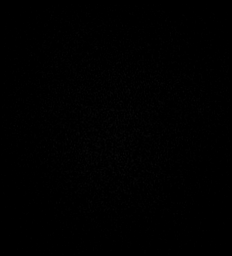

[Series 17: T2 · coronal · 5.0mm · 0.57mm/px · 2 of 29 slices shown (2 of 2)]
[im 1/29]
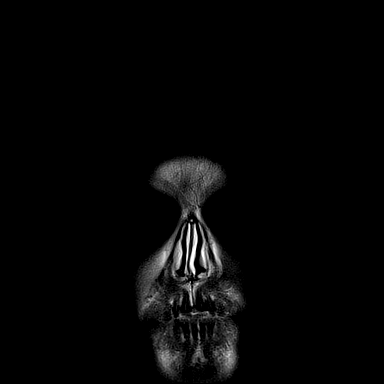
[im 29/29]
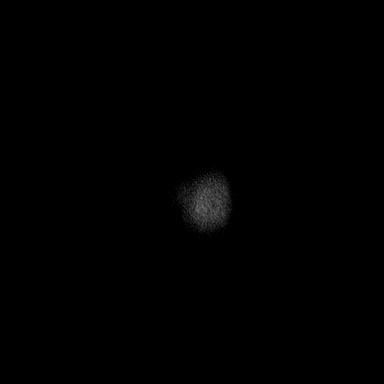

[48 of 48 positions shown; findings below may reference images not displayed]

FINDINGS: Brain: There is no evidence of an acute infarct, intracranial
hemorrhage, mass, midline shift, or extra-axial fluid collection.
The ventricles and sulci are normal. The brain is normal in signal.
The cerebellar tonsils are normally positioned.

Vascular: Major intracranial vascular flow voids are preserved.

Skull and upper cervical spine: Unremarkable bone marrow signal para

Sinuses/Orbits: Unremarkable orbits. Paranasal sinuses and mastoid
air cells are clear.

Other: 2.5 cm lobulated mass versus 2 adjacent masses in the midline
frontal scalp, correlate with direct inspection.
IMPRESSION: Unremarkable appearance of the brain.

## 2023-06-11 ENCOUNTER — Other Ambulatory Visit: Payer: Self-pay | Admitting: Nurse Practitioner

## 2023-06-11 DIAGNOSIS — E559 Vitamin D deficiency, unspecified: Secondary | ICD-10-CM

## 2023-06-11 DIAGNOSIS — D508 Other iron deficiency anemias: Secondary | ICD-10-CM

## 2023-06-11 DIAGNOSIS — R5383 Other fatigue: Secondary | ICD-10-CM

## 2023-06-11 DIAGNOSIS — E538 Deficiency of other specified B group vitamins: Secondary | ICD-10-CM

## 2023-06-11 DIAGNOSIS — E782 Mixed hyperlipidemia: Secondary | ICD-10-CM

## 2023-06-13 LAB — CBC WITH DIFFERENTIAL/PLATELET
Basophils Absolute: 0 10*3/uL (ref 0.0–0.2)
Basos: 0 %
EOS (ABSOLUTE): 0.2 10*3/uL (ref 0.0–0.4)
Eos: 3 %
Hematocrit: 47.8 % — ABNORMAL HIGH (ref 34.0–46.6)
Hemoglobin: 15.8 g/dL (ref 11.1–15.9)
Immature Grans (Abs): 0 10*3/uL (ref 0.0–0.1)
Immature Granulocytes: 0 %
Lymphocytes Absolute: 2.2 10*3/uL (ref 0.7–3.1)
Lymphs: 31 %
MCH: 28.7 pg (ref 26.6–33.0)
MCHC: 33.1 g/dL (ref 31.5–35.7)
MCV: 87 fL (ref 79–97)
Monocytes Absolute: 0.4 10*3/uL (ref 0.1–0.9)
Monocytes: 6 %
Neutrophils Absolute: 4.2 10*3/uL (ref 1.4–7.0)
Neutrophils: 60 %
Platelets: 239 10*3/uL (ref 150–450)
RBC: 5.51 x10E6/uL — ABNORMAL HIGH (ref 3.77–5.28)
RDW: 12.5 % (ref 11.7–15.4)
WBC: 7.1 10*3/uL (ref 3.4–10.8)

## 2023-06-13 LAB — B12 AND FOLATE PANEL
Folate: 15.5 ng/mL (ref >3.0)
Vitamin B-12: 1391 pg/mL — ABNORMAL HIGH (ref 232–1245)

## 2023-06-13 LAB — CMP14+EGFR
ALT: 15 [IU]/L (ref 0–32)
AST: 14 [IU]/L (ref 0–40)
Albumin: 4.7 g/dL (ref 3.9–4.9)
Alkaline Phosphatase: 54 [IU]/L (ref 44–121)
BUN/Creatinine Ratio: 13 (ref 9–23)
BUN: 10 mg/dL (ref 6–20)
Bilirubin Total: 0.3 mg/dL (ref 0.0–1.2)
CO2: 22 mmol/L (ref 20–29)
Calcium: 9.8 mg/dL (ref 8.7–10.2)
Chloride: 103 mmol/L (ref 96–106)
Creatinine, Ser: 0.79 mg/dL (ref 0.57–1.00)
Globulin, Total: 2.8 g/dL (ref 1.5–4.5)
Glucose: 89 mg/dL (ref 70–99)
Potassium: 4.8 mmol/L (ref 3.5–5.2)
Sodium: 138 mmol/L (ref 134–144)
Total Protein: 7.5 g/dL (ref 6.0–8.5)
eGFR: 100 mL/min/{1.73_m2} (ref >59)

## 2023-06-13 LAB — LIPID PANEL
Chol/HDL Ratio: 4.3 {ratio} (ref 0.0–4.4)
Cholesterol, Total: 186 mg/dL (ref 100–199)
HDL: 43 mg/dL (ref >39)
LDL Chol Calc (NIH): 130 mg/dL — ABNORMAL HIGH (ref 0–99)
Triglycerides: 72 mg/dL (ref 0–149)
VLDL Cholesterol Cal: 13 mg/dL (ref 5–40)

## 2023-06-13 LAB — VITAMIN D 25 HYDROXY (VIT D DEFICIENCY, FRACTURES): Vit D, 25-Hydroxy: 39.8 ng/mL (ref 30.0–100.0)

## 2023-06-13 LAB — IRON,TIBC AND FERRITIN PANEL
Ferritin: 65 ng/mL (ref 15–150)
Iron Saturation: 14 % — ABNORMAL LOW (ref 15–55)
Iron: 44 ug/dL (ref 27–159)
Total Iron Binding Capacity: 311 ug/dL (ref 250–450)
UIBC: 267 ug/dL (ref 131–425)

## 2023-06-14 ENCOUNTER — Encounter: Payer: Self-pay | Admitting: Nurse Practitioner

## 2023-06-14 ENCOUNTER — Ambulatory Visit (INDEPENDENT_AMBULATORY_CARE_PROVIDER_SITE_OTHER): Payer: Managed Care, Other (non HMO) | Admitting: Nurse Practitioner

## 2023-06-14 VITALS — BP 120/86 | HR 83 | Temp 98.3°F | Resp 16 | Ht 60.0 in | Wt 125.2 lb

## 2023-06-14 DIAGNOSIS — D751 Secondary polycythemia: Secondary | ICD-10-CM

## 2023-06-14 DIAGNOSIS — F411 Generalized anxiety disorder: Secondary | ICD-10-CM

## 2023-06-14 DIAGNOSIS — E782 Mixed hyperlipidemia: Secondary | ICD-10-CM

## 2023-06-14 DIAGNOSIS — Z0001 Encounter for general adult medical examination with abnormal findings: Secondary | ICD-10-CM | POA: Diagnosis not present

## 2023-06-14 MED ORDER — HYDROXYZINE HCL 10 MG PO TABS: 10.0000 mg | ORAL_TABLET | Freq: Two times a day (BID) | ORAL | 5 refills | Status: DC | PRN

## 2023-06-14 MED ORDER — MECLIZINE HCL 12.5 MG PO TABS: 12.5000 mg | ORAL_TABLET | Freq: Three times a day (TID) | ORAL | 1 refills | Status: DC | PRN

## 2023-06-14 MED ORDER — FERRALET 90 90-1 MG PO TABS: 1.0000 | ORAL_TABLET | Freq: Every day | ORAL | 5 refills | Status: AC

## 2023-06-14 MED ORDER — ONDANSETRON 4 MG PO TBDP: 4.0000 mg | ORAL_TABLET | Freq: Three times a day (TID) | ORAL | 5 refills | Status: DC | PRN

## 2023-06-14 NOTE — Progress Notes (Signed)
 [facility] [address] [place], [place] [address]  Internal MEDICINE  Office Visit Note  Patient Name: Ebony Lawson  [id number]  [id number]  Date of Service: 06/14/2023  Chief Complaint  Patient presents with   Annual Exam    HPI Ebony Lawson presents for an annual well visit and physical exam.  Well-appearing 38 y.o. female with iron deficiency anemia, GAD and arthritis  Pap smear: due in 2027 Labs: lab results reviewed with patient. Triglycerides and VLDL levels have improved. LDL increased iron panel is normal except slightly low iron saturation.  Elevated RBC and hematocrit, no previous abnormal levels on CBC New or worsening pain: none  Other concerns: none     Current Medication: Outpatient Encounter Medications as of 06/14/2023  Medication Sig   Adapalene  0.3 % gel APPLY PEA SIZED AMOUNT TOPICALLY TO FACE EVERY NIGHT   Calcium Carbonate-Vitamin D  (CALCIUM 600+D3 PO) Take 1 tablet by mouth daily.   Clascoterone  (WINLEVI ) 1 % CREA Apply to face twice daily for acne.   clindamycin  (CLEOCIN  T) 1 % external solution APPLY TOPICALLY TO FACE AT NIGHT, BEFORE ADAPALENE , FOR ACNE   clobetasol  cream (TEMOVATE ) 0.05 % Apply to aa's BID up to two weeks. Avoid applying to face, groin, and axilla. Use as directed. Long-term use can cause thinning of the skin.   etonogestrel  (NEXPLANON ) 68 MG IMPL implant 1 each by Subdermal route once.   Polypodium Leucotomos (HELIOCARE PO) Take by mouth.   spironolactone  (ALDACTONE ) 25 MG tablet Take 1 tablet (25 mg total) by mouth in the morning.   VITAMIN D  PO Take by mouth.   [DISCONTINUED] Fe Cbn-Fe Gluc-FA-B12-C-DSS (FERRALET  90) 90-1 MG TABS Take 1 tablet by mouth daily.   [DISCONTINUED] hydrOXYzine  (ATARAX ) 10 MG tablet Take 1-2 tablets (10-20 mg total) by mouth 2 (two) times daily as needed for anxiety. For severe anxiety   [DISCONTINUED] meclizine  (ANTIVERT ) 12.5 MG tablet Take 1 tablet (12.5 mg total) by mouth 3 (three) times daily  as needed for dizziness or nausea.   [DISCONTINUED] ondansetron  (ZOFRAN -ODT) 4 MG disintegrating tablet Take 1 tablet (4 mg total) by mouth every 8 (eight) hours as needed for nausea or vomiting.   Fe Cbn-Fe Gluc-FA-B12-C-DSS (FERRALET  90) 90-1 MG TABS Take 1 tablet by mouth daily.   hydrOXYzine  (ATARAX ) 10 MG tablet Take 1-2 tablets (10-20 mg total) by mouth 2 (two) times daily as needed for anxiety. For severe anxiety   meclizine  (ANTIVERT ) 12.5 MG tablet Take 1 tablet (12.5 mg total) by mouth 3 (three) times daily as needed for dizziness or nausea.   ondansetron  (ZOFRAN -ODT) 4 MG disintegrating tablet Take 1 tablet (4 mg total) by mouth every 8 (eight) hours as needed for nausea or vomiting.   No facility-administered encounter medications on file as of 06/14/2023.    Surgical History: History reviewed. No pertinent surgical history.  Medical History: Past Medical History:  Diagnosis Date   Heart murmur    Migraine     Family History: Family History  Problem Relation Age of Onset   Hyperlipidemia Mother    Cancer Father        prostate and skin cancer   Hyperlipidemia Father    Heart disease Father        CAD - triple bypass 2014   Hypertension Father    Melanoma Father    Hearing loss Paternal Grandfather    Hypertension Paternal Grandfather    Anxiety disorder Brother    Breast cancer Neg Hx  Social History   Socioeconomic History   Marital status: Single    Spouse name: Not on file   Number of children: 0   Years of education: 16   Highest education level: Bachelor's degree (e.g., BA, AB, BS)  Occupational History   Occupation: DNA Administrator, [employer]: [employer]  Tobacco Use   Smoking status: Never   Smokeless tobacco: Never  Vaping Use   Vaping status: Never Used  Substance and Sexual Activity   Alcohol use: Yes    Comment: once a week   Drug use: No   Sexual activity: Yes    Partners: Male    Birth control/protection: OCP    Comment: 1  partner   Other Topics Concern   Not on file  Social History Narrative   Ebony Lawson grew up outside of [place]. 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       Hobbies - plays trumpet   Exercise - none at present   Caffeine - rare    Social Drivers of Health   Financial Resource Strain: Low Risk  (02/15/2018)   Overall Financial Resource Strain (CARDIA)    Difficulty of Paying Living Expenses: Not hard at all  Food Insecurity: No Food Insecurity (02/15/2018)   Hunger Vital Sign    Worried About Running Out of Food in the Last Year: Never true    Ran Out of Food in the Last Year: Never true  Transportation Needs: No Transportation Needs (02/15/2018)   PRAPARE - Administrator, Civil Service (Medical): No    Lack of Transportation (Non-Medical): No  Physical Activity: Sufficiently Active (02/15/2018)   Exercise Vital Sign    Days of Exercise per Week: 4 days    Minutes of Exercise per Session: 50 min  Stress: Stress Concern Present (02/15/2018)   [employer] of Occupational Health - Occupational Stress Questionnaire    Feeling of Stress : Very much  Social Connections: Unknown (02/15/2018)   Social Connection and Isolation Panel [NHANES]    Frequency of Communication with Friends and Family: Not on file    Frequency of Social Gatherings with Friends and Family: Not on file    Attends Religious Services: More than 4 times per year    Active Member of [employer] or Organizations: Yes    Attends Banker Meetings: More than 4 times per year    Marital Status: Never married  Intimate Partner Violence: Not At Risk (02/15/2018)   Humiliation, Afraid, Rape, and Kick questionnaire    Fear of Current or Ex-Partner: No    Emotionally Abused: No    Physically Abused: No    Sexually Abused: No      Review of Systems  Constitutional:  Negative for activity change, appetite change, chills,  fatigue, fever and unexpected weight change.  HENT: Negative.  Negative for congestion, ear pain, rhinorrhea, sore throat and trouble swallowing.   Eyes: Negative.   Respiratory: Negative.  Negative for cough, chest tightness, shortness of breath and wheezing.   Cardiovascular: Negative.  Negative for chest pain.  Gastrointestinal: Negative.  Negative for abdominal pain, blood in stool, constipation, diarrhea, nausea and vomiting.  Endocrine: Negative.   Genitourinary: Negative.  Negative for difficulty urinating, dysuria, frequency, hematuria and urgency.  Musculoskeletal: Negative.  Negative for arthralgias, back pain, joint swelling, myalgias and neck pain.  Skin: Negative.  Negative for rash  and wound.  Allergic/Immunologic: Negative.  Negative for immunocompromised state.  Neurological: Negative.  Negative for dizziness, seizures, numbness and headaches.  Hematological: Negative.   Psychiatric/Behavioral: Negative.  Negative for behavioral problems, self-injury and suicidal ideas. The patient is not nervous/anxious.     Vital Signs: BP 120/86   Pulse 83   Temp 98.3 F (36.8 C)   Resp 16   Ht 5' (1.524 m)   Wt 125 lb 3.2 oz (56.8 kg)   SpO2 99%   BMI 24.45 kg/m    Physical Exam Vitals reviewed.  Constitutional:      General: She is awake. She is not in acute distress.    Appearance: Normal appearance. She is well-developed, well-groomed and normal weight. She is not diaphoretic.  HENT:     Head: Normocephalic and atraumatic.     Right Ear: Tympanic membrane, ear canal and external ear normal.     Left Ear: Tympanic membrane, ear canal and external ear normal.     Nose: Nose normal. No congestion or rhinorrhea.     Mouth/Throat:     Mouth: Mucous membranes are moist.     Pharynx: Oropharynx is clear. No oropharyngeal exudate or posterior oropharyngeal erythema.  Eyes:     General: No scleral icterus.       Right eye: No discharge.        Left eye: No discharge.      Extraocular Movements: Extraocular movements intact.     Conjunctiva/sclera: Conjunctivae normal.     Pupils: Pupils are equal, round, and reactive to light.  Neck:     Thyroid: No thyromegaly.     Vascular: No JVD.     Trachea: No tracheal deviation.  Cardiovascular:     Rate and Rhythm: Normal rate and regular rhythm.     Pulses: Normal pulses.     Heart sounds: Normal heart sounds, S1 normal and S2 normal. No murmur heard.    No friction rub. No gallop.  Pulmonary:     Effort: Pulmonary effort is normal. No respiratory distress.     Breath sounds: Normal breath sounds. No stridor. No wheezing or rales.  Chest:     Chest wall: No tenderness.  Abdominal:     General: Bowel sounds are normal. There is no distension.     Palpations: Abdomen is soft. There is no mass.     Tenderness: There is no abdominal tenderness. There is no guarding or rebound.  Genitourinary:    Uterus: Normal.      Adnexa: Right adnexa normal and left adnexa normal.  Musculoskeletal:        General: No tenderness or deformity. Normal range of motion.     Cervical back: Normal range of motion and neck supple.  Lymphadenopathy:     Cervical: No cervical adenopathy.  Skin:    General: Skin is warm and dry.     Capillary Refill: Capillary refill takes less than 2 seconds.     Coloration: Skin is not pale.     Findings: No erythema or rash.  Neurological:     Mental Status: She is alert and oriented to person, place, and time.     Cranial Nerves: No cranial nerve deficit.     Motor: No abnormal muscle tone.     Coordination: Coordination normal.     Gait: Gait normal.     Deep Tendon Reflexes: Reflexes are normal and symmetric.  Psychiatric:        Mood and Affect: Mood  normal.        Behavior: Behavior normal. Behavior is cooperative.        Thought Content: Thought content normal.        Judgment: Judgment normal.        Assessment/Plan: 1. Encounter for routine adult health examination with  abnormal findings (Primary) Age-appropriate preventive screenings and vaccinations discussed, annual physical exam completed. Routine labs for health maintenance results reviewed with the patient today. Routine medication refills ordered today. PHM updated.   - Fe Cbn-Fe Gluc-FA-B12-C-DSS (FERRALET  90) 90-1 MG TABS; Take 1 tablet by mouth daily.  Dispense: 30 tablet; Refill: 5 - meclizine  (ANTIVERT ) 12.5 MG tablet; Take 1 tablet (12.5 mg total) by mouth 3 (three) times daily as needed for dizziness or nausea.  Dispense: 90 tablet; Refill: 1 - hydrOXYzine  (ATARAX ) 10 MG tablet; Take 1-2 tablets (10-20 mg total) by mouth 2 (two) times daily as needed for anxiety. For severe anxiety  Dispense: 60 tablet; Refill: 5 - ondansetron  (ZOFRAN -ODT) 4 MG disintegrating tablet; Take 1 tablet (4 mg total) by mouth every 8 (eight) hours as needed for nausea or vomiting.  Dispense: 20 tablet; Refill: 5  2. Erythrocytosis Follow up in 3 months with repeat labs. If RBC and hematocrit are still elevated, will refer to hematology for further evaluation.   3. Mixed hyperlipidemia Limit red meat intake, increase lean proteins in diet, and take dish oil or flaxseed oil supplement if desired.   4. GAD (generalized anxiety disorder) Continue hydroxyzine  as needed as prescribed  - hydrOXYzine  (ATARAX ) 10 MG tablet; Take 1-2 tablets (10-20 mg total) by mouth 2 (two) times daily as needed for anxiety. For severe anxiety  Dispense: 60 tablet; Refill: 5     General Counseling: Ebony Lawson verbalizes understanding of the findings of todays visit and agrees with plan of treatment. I have discussed any further diagnostic evaluation that may be needed or ordered today. We also reviewed her medications today. she has been encouraged to call the office with any questions or concerns that should arise related to todays visit.    No orders of the defined types were placed in this encounter.   Meds ordered this encounter   Medications   Fe Cbn-Fe Gluc-FA-B12-C-DSS (FERRALET  90) 90-1 MG TABS    Sig: Take 1 tablet by mouth daily.    Dispense:  30 tablet    Refill:  5    Iron and B12 deficiency.   meclizine  (ANTIVERT ) 12.5 MG tablet    Sig: Take 1 tablet (12.5 mg total) by mouth 3 (three) times daily as needed for dizziness or nausea.    Dispense:  90 tablet    Refill:  1    For future refills, do not fill unless patient calls for it   hydrOXYzine  (ATARAX ) 10 MG tablet    Sig: Take 1-2 tablets (10-20 mg total) by mouth 2 (two) times daily as needed for anxiety. For severe anxiety    Dispense:  60 tablet    Refill:  5    For future refills, do not fill, patient will call if she needs it   ondansetron  (ZOFRAN -ODT) 4 MG disintegrating tablet    Sig: Take 1 tablet (4 mg total) by mouth every 8 (eight) hours as needed for nausea or vomiting.    Dispense:  20 tablet    Refill:  5    Return in about 3 months (around 09/12/2023) for F/U, Labs, Stephie Xu PCP.   Total time spent:30 Minutes Time spent includes review  of chart, medications, test results, and follow up plan with the patient.   Ebony Lawson Controlled Substance Database was reviewed by me.  This patient was seen by Mardy Maxin, FNP-C in collaboration with Dr. Sigrid Bathe as a part of collaborative care agreement.  Ahliya Glatt R. Maxin, MSN, FNP-C Internal medicine

## 2023-07-03 ENCOUNTER — Ambulatory Visit: Payer: Managed Care, Other (non HMO) | Admitting: Dermatology

## 2023-07-10 ENCOUNTER — Encounter: Payer: Self-pay | Admitting: Dermatology

## 2023-07-10 ENCOUNTER — Ambulatory Visit: Payer: Managed Care, Other (non HMO) | Admitting: Dermatology

## 2023-07-10 DIAGNOSIS — L309 Dermatitis, unspecified: Secondary | ICD-10-CM | POA: Diagnosis not present

## 2023-07-10 DIAGNOSIS — L700 Acne vulgaris: Secondary | ICD-10-CM

## 2023-07-10 DIAGNOSIS — L719 Rosacea, unspecified: Secondary | ICD-10-CM | POA: Diagnosis not present

## 2023-07-10 MED ORDER — AZELAIC ACID 15 % EX GEL: CUTANEOUS | 6 refills | Status: DC

## 2023-07-10 MED ORDER — MOMETASONE FUROATE 0.1 % EX CREA: TOPICAL_CREAM | CUTANEOUS | 0 refills | Status: DC

## 2023-07-10 NOTE — Progress Notes (Addendum)
 Follow-Up Visit   Subjective  Ebony Lawson is a 38 y.o. female who presents for the following: 6 month acne follow up. Patient using adapalene  0.3% gel and clindamycin  solution in the am and Cln face wash at night. Pt is also taking spironolactone  25 mg in AM and 100 mg  in PM, no side effects. Patient pleased with treatment.  Patient also with itching at neck. Happened about the same time last year through winter. She uses Vaseline and moisturizer.    The following portions of the chart were reviewed this encounter and updated as appropriate: medications, allergies, medical history  Review of Systems:  No other skin or systemic complaints except as noted in HPI or Assessment and Plan.  Objective  Well appearing patient in no apparent distress; mood and affect are within normal limits.   A focused examination was performed of the following areas: Face, neck  Relevant exam findings are noted in the Assessment and Plan.  neck, chest Mild erythema with few small pink papules at lower neck, upper chest  Assessment & Plan   ACNE VULGARIS with component of Rosacea Exam: erythema at cheeks, nose with few scattered closed comedones, inflamed comedones at forehead, otherwise clear  Chronic and persistent condition with duration or expected duration over one year. Condition is symptomatic / bothersome to patient. Not to goal.   Treatment Plan: Start Finacea  15% gel in the morning Start Finacea  15% gel at bedtime followed with adapalene  0.3% gel at bedtime  May continue clindamycin  solution as needed for break outs in the morning Continue Spironolactone  25 mg Po qam and 100 mg Po at bedtime. Recommend moisturizer with sunscreen daily.    Topical retinoid medications like tretinoin/Retin-A, adapalene /Differin , tazarotene/Fabior, and Epiduo/Epiduo Forte can cause dryness and irritation when first started. Only apply a pea-sized amount to the entire affected area. Avoid applying it  around the eyes, edges of mouth and creases at the nose. If you experience irritation, use a good moisturizer first and/or apply the medicine less often. If you are doing well with the medicine, you can increase how often you use it until you are applying every night. Be careful with sun protection while using this medication as it can make you sensitive to the sun. This medicine should not be used by pregnant women.   Spironolactone  can cause increased urination and cause blood pressure to decrease. Please watch for signs of lightheadedness and be cautious when changing position. It can sometimes cause breast tenderness or an irregular period in premenopausal women. It can also increase potassium. The increase in potassium usually is not a concern unless you are taking other medicines that also increase potassium, so please be sure your doctor knows all of the other medications you are taking. This medication should not be taken by pregnant women.  This medicine should also not be taken together with sulfa  drugs like Bactrim  (trimethoprim /sulfamethexazole).    ACNE VULGARIS   Related Medications clindamycin  (CLEOCIN  T) 1 % external solution APPLY TOPICALLY TO FACE AT NIGHT, BEFORE ADAPALENE , FOR ACNE Adapalene  0.3 % gel APPLY PEA SIZED AMOUNT TOPICALLY TO FACE EVERY NIGHT spironolactone  (ALDACTONE ) 25 MG tablet Take 1 tablet (25 mg total) by mouth in the morning. Azelaic Acid  15 % gel After skin is thoroughly washed and patted dry, gently but thoroughly massage a thin film of azelaic acid  cream into the affected area twice daily, in the morning and evening. ECZEMA, UNSPECIFIED TYPE neck, chest  Start mometasone  cr 1-2 times daily as  needed for itch until rash clear. Avoid applying to face, groin, and axilla. Use as directed. Long-term use can cause thinning of the skin.  Topical steroids (such as triamcinolone , fluocinolone, fluocinonide, mometasone , clobetasol , halobetasol, betamethasone,  hydrocortisone ) can cause thinning and lightening of the skin if they are used for too long in the same area. Your physician has selected the right strength medicine for your problem and area affected on the body. Please use your medication only as directed by your physician to prevent side effects.   Recommend mild soap and moisturizing cream 1-2 times daily.  Gentle skin care handout provided.   mometasone  (ELOCON ) 0.1 % cream - neck, chest Apply 1-2 times daily as needed for itch until rash clear. Avoid applying to face, groin, and axilla. Use as directed. Long-term use can cause thinning of the skin.  Return in about 6 months (around 01/07/2024) for acne, with Dr. Jackquline.  LILLETTE Lonell Drones, RMA, am acting as scribe for Rexene Jackquline, MD .   Documentation: I have reviewed the above documentation for accuracy and completeness, and I agree with the above.  Rexene Jackquline, MD

## 2023-07-10 NOTE — Patient Instructions (Addendum)
 Treatment Plan: Start Finacea  gel in the morning Start Finacea  gel at bedtime followed with adapalene  0.3% gel at bedtime  May continue clindamycin  as needed for break outs in the morning   Recommend moisturizer with sunscreen daily.   Topical retinoid medications like tretinoin/Retin-A, adapalene /Differin , tazarotene/Fabior, and Epiduo/Epiduo Forte can cause dryness and irritation when first started. Only apply a pea-sized amount to the entire affected area. Avoid applying it around the eyes, edges of mouth and creases at the nose. If you experience irritation, use a good moisturizer first and/or apply the medicine less often. If you are doing well with the medicine, you can increase how often you use it until you are applying every night. Be careful with sun protection while using this medication as it can make you sensitive to the sun. This medicine should not be used by pregnant women.   Start mometasone  cr 1-2 times daily as needed for itch until rash clear. Avoid applying to face, groin, and axilla. Use as directed. Long-term use can cause thinning of the skin.  Topical steroids (such as triamcinolone , fluocinolone, fluocinonide, mometasone , clobetasol , halobetasol, betamethasone, hydrocortisone ) can cause thinning and lightening of the skin if they are used for too long in the same area. Your physician has selected the right strength medicine for your problem and area affected on the body. Please use your medication only as directed by your physician to prevent side effects.   Due to recent changes in healthcare laws, you may see results of your pathology and/or laboratory studies on MyChart before the doctors have had a chance to review them. We understand that in some cases there may be results that are confusing or concerning to you. Please understand that not all results are received at the same time and often the doctors may need to interpret multiple results in order to provide you with the  best plan of care or course of treatment. Therefore, we ask that you please give us  2 business days to thoroughly review all your results before contacting the office for clarification. Should we see a critical lab result, you will be contacted sooner.   If You Need Anything After Your Visit  If you have any questions or concerns for your doctor, please call our main line at [phone] and press option 4 to reach your doctor's medical assistant. If no one answers, please leave a voicemail as directed and we will return your call as soon as possible. Messages left after 4 pm will be answered the following business day.   You may also send us  a message via MyChart. We typically respond to MyChart messages within 1-2 business days.  For prescription refills, please ask your pharmacy to contact our office. Our fax number is [phone].  If you have an urgent issue when the clinic is closed that cannot wait until the next business day, you can page your doctor at the number below.    Please note that while we do our best to be available for urgent issues outside of office hours, we are not available 24/7.   If you have an urgent issue and are unable to reach us , you may choose to seek medical care at your doctor's office, retail clinic, urgent care center, or emergency room.  If you have a medical emergency, please immediately call 911 or go to the emergency department.  Pager Numbers  - Dr. Hester: [phone]  - Dr. Jackquline: [phone]  - Dr. Claudene: [phone]   In the event of  inclement weather, please call our main line at [phone] for an update on the status of any delays or closures.  Dermatology Medication Tips: Please keep the boxes that topical medications come in in order to help keep track of the instructions about where and how to use these. Pharmacies typically print the medication instructions only on the boxes and not directly on the medication tubes.   If  your medication is too expensive, please contact our office at [phone] option 4 or send us  a message through MyChart.   We are unable to tell what your co-pay for medications will be in advance as this is different depending on your insurance coverage. However, we may be able to find a substitute medication at lower cost or fill out paperwork to get insurance to cover a needed medication.   If a prior authorization is required to get your medication covered by your insurance company, please allow us  1-2 business days to complete this process.  Drug prices often vary depending on where the prescription is filled and some pharmacies may offer cheaper prices.  The website www.goodrx.com contains coupons for medications through different pharmacies. The prices here do not account for what the cost may be with help from insurance (it may be cheaper with your insurance), but the website can give you the price if you did not use any insurance.  - You can print the associated coupon and take it with your prescription to the pharmacy.  - You may also stop by our office during regular business hours and pick up a GoodRx coupon card.  - If you need your prescription sent electronically to a different pharmacy, notify our office through [facility] or by phone at [phone] option 4.     Si Usted Necesita Algo Despus de Su Visita  Tambin puede enviarnos un mensaje a travs de Clinical Cytogeneticist. Por lo general respondemos a los mensajes de MyChart en el transcurso de 1 a 2 das hbiles.  Para renovar recetas, por favor pida a su farmacia que se ponga en contacto con nuestra oficina. Randi lakes de fax es [place] [phone].  Si tiene un asunto urgente cuando la clnica est cerrada y que no puede esperar hasta el siguiente da hbil, puede llamar/localizar a su doctor(a) al nmero que aparece a continuacin.   Por favor, tenga en cuenta que aunque hacemos todo lo posible para estar disponibles para  asuntos urgentes fuera del horario de [place], no estamos disponibles las 24 horas del da, los 7 [address]  [phone] de la [place].   Si tiene un problema urgente y no puede comunicarse con nosotros, puede optar por buscar atencin mdica  en el consultorio de su doctor(a), en una clnica privada, en un centro de atencin urgente o en una sala de emergencias.  Si tiene engineer, drilling, por favor llame inmediatamente al 911 o vaya a la sala de emergencias.  Nmeros de bper  - Dr. Hester: [phone]  - Dra. Jackquline: [phone]  - Dr. Claudene: [phone]   En caso de inclemencias del tiempo, por favor llame a landry capes principal al [phone] para una actualizacin sobre el [place] de cualquier retraso o cierre.  Consejos para la medicacin en dermatologa: Por favor, guarde las cajas en las que vienen los medicamentos de uso tpico para ayudarle a seguir las instrucciones sobre dnde y cmo usarlos. Las farmacias generalmente imprimen las instrucciones del medicamento slo en las cajas y no directamente en los tubos del [place].   Si su medicamento  es muy caro, por favor, pngase en contacto con landry rieger llamando al [phone] y presione la opcin 4 o envenos un mensaje a travs de Clinical Cytogeneticist.   No podemos decirle cul ser su copago por los medicamentos por adelantado ya que esto es diferente dependiendo de la cobertura de su seguro. Sin embargo, es posible que podamos encontrar un medicamento sustituto a audiological scientist un formulario para que el seguro cubra el medicamento que se considera necesario.   Si se requiere una autorizacin previa para que su compaa de seguros cubra su medicamento, por favor permtanos de 1 a 2 das hbiles para completar este proceso.  Los precios de los medicamentos varan con frecuencia dependiendo del environmental consultant de dnde se surte la receta y alguna farmacias pueden ofrecer precios ms baratos.  El sitio web www.goodrx.com tiene cupones para  medicamentos de health and safety inspector. Los precios aqu no tienen en cuenta lo que podra costar con la ayuda del seguro (puede ser ms barato con su seguro), pero el sitio web puede darle el precio si no utiliz tourist information centre manager.  - Puede imprimir el cupn correspondiente y llevarlo con su receta a la farmacia.  - Tambin puede pasar por nuestra oficina durante el horario de atencin regular y education officer, museum una tarjeta de cupones de GoodRx.  - Si necesita que su receta se enve electrnicamente a una farmacia diferente, informe a nuestra oficina a travs de MyChart de [place] o por telfono llamando al [phone] y presione la opcin 4.

## 2023-07-17 ENCOUNTER — Other Ambulatory Visit: Payer: Self-pay | Admitting: Dermatology

## 2023-07-17 DIAGNOSIS — L700 Acne vulgaris: Secondary | ICD-10-CM

## 2023-07-19 ENCOUNTER — Encounter: Payer: Self-pay | Admitting: Dermatology

## 2023-07-19 ENCOUNTER — Other Ambulatory Visit: Payer: Self-pay

## 2023-07-19 MED ORDER — SPIRONOLACTONE 100 MG PO TABS: 100.0000 mg | ORAL_TABLET | Freq: Every day | ORAL | 1 refills | Status: DC

## 2023-07-30 ENCOUNTER — Ambulatory Visit: Payer: Managed Care, Other (non HMO) | Admitting: Dermatology

## 2023-09-05 ENCOUNTER — Other Ambulatory Visit: Payer: Self-pay | Admitting: Nurse Practitioner

## 2023-09-05 ENCOUNTER — Other Ambulatory Visit: Payer: Self-pay | Admitting: Dermatology

## 2023-09-05 DIAGNOSIS — L700 Acne vulgaris: Secondary | ICD-10-CM

## 2023-09-06 LAB — CBC WITH DIFFERENTIAL/PLATELET
Basophils Absolute: 0 10*3/uL (ref 0.0–0.2)
Basos: 1 %
EOS (ABSOLUTE): 0.2 10*3/uL (ref 0.0–0.4)
Eos: 2 %
Hematocrit: 44.7 % (ref 34.0–46.6)
Hemoglobin: 14.8 g/dL (ref 11.1–15.9)
Immature Grans (Abs): 0 10*3/uL (ref 0.0–0.1)
Immature Granulocytes: 0 %
Lymphocytes Absolute: 2.2 10*3/uL (ref 0.7–3.1)
Lymphs: 29 %
MCH: 28.4 pg (ref 26.6–33.0)
MCHC: 33.1 g/dL (ref 31.5–35.7)
MCV: 86 fL (ref 79–97)
Monocytes Absolute: 0.5 10*3/uL (ref 0.1–0.9)
Monocytes: 6 %
Neutrophils Absolute: 4.7 10*3/uL (ref 1.4–7.0)
Neutrophils: 62 %
Platelets: 196 10*3/uL (ref 150–450)
RBC: 5.21 x10E6/uL (ref 3.77–5.28)
RDW: 12.4 % (ref 11.7–15.4)
WBC: 7.7 10*3/uL (ref 3.4–10.8)

## 2023-09-06 LAB — IRON AND TIBC
Iron Saturation: 14 % — ABNORMAL LOW (ref 15–55)
Iron: 42 ug/dL (ref 27–159)
Total Iron Binding Capacity: 298 ug/dL (ref 250–450)
UIBC: 256 ug/dL (ref 131–425)

## 2023-09-06 LAB — FERRITIN: Ferritin: 58 ng/mL (ref 15–150)

## 2023-09-11 ENCOUNTER — Telehealth: Payer: Self-pay | Admitting: Nurse Practitioner

## 2023-09-11 ENCOUNTER — Encounter: Payer: Self-pay | Admitting: Nurse Practitioner

## 2023-09-11 ENCOUNTER — Ambulatory Visit (INDEPENDENT_AMBULATORY_CARE_PROVIDER_SITE_OTHER): Payer: Managed Care, Other (non HMO) | Admitting: Nurse Practitioner

## 2023-09-11 VITALS — BP 122/76 | HR 78 | Temp 98.2°F | Resp 16 | Ht 60.0 in | Wt 120.6 lb

## 2023-09-11 DIAGNOSIS — D508 Other iron deficiency anemias: Secondary | ICD-10-CM | POA: Diagnosis not present

## 2023-09-11 DIAGNOSIS — E782 Mixed hyperlipidemia: Secondary | ICD-10-CM

## 2023-09-11 DIAGNOSIS — G43A0 Cyclical vomiting, not intractable: Secondary | ICD-10-CM

## 2023-09-11 DIAGNOSIS — D751 Secondary polycythemia: Secondary | ICD-10-CM

## 2023-09-11 DIAGNOSIS — R42 Dizziness and giddiness: Secondary | ICD-10-CM

## 2023-09-11 MED ORDER — MECLIZINE HCL 12.5 MG PO TABS: 12.5000 mg | ORAL_TABLET | Freq: Three times a day (TID) | ORAL | 1 refills | Status: DC | PRN

## 2023-09-11 MED ORDER — ONDANSETRON 4 MG PO TBDP: 4.0000 mg | ORAL_TABLET | Freq: Three times a day (TID) | ORAL | 5 refills | Status: DC | PRN

## 2023-09-11 NOTE — Telephone Encounter (Signed)
 Lvm to scheduled today's 6 mos follow up-Ebony Lawson

## 2023-09-11 NOTE — Progress Notes (Signed)
 [facility] [address] [place], [place] [address]  Internal MEDICINE  Office Visit Note  Patient Name: Ebony Lawson  [id number]  [id number]  Date of Service: 09/11/2023  Chief Complaint  Patient presents with   Follow-up    Review labs    HPI Ebony Lawson presents for a follow-up visit for lab results and refills  CBC returned to normal, iron panel is ok Cholesterol panel -- LDLD increased to 130, the rest of the panel is normal  Anemia -- stable, iron levels are good. Taking iron supplement twice a week.  Dizziness -- takes meclizine as needed Nausea and vomiting with migraines -- takes zofran as needed    Current Medication: Outpatient Encounter Medications as of 09/11/2023  Medication Sig   Adapalene 0.3 % gel APPLY PEA SIZED AMOUNT TOPICALLY TO FACE EVERY NIGHT   Azelaic Acid 15 % gel After skin is thoroughly washed and patted dry, gently but thoroughly massage a thin film of azelaic acid cream into the affected area twice daily, in the morning and evening.   Calcium Carbonate-Vitamin D (CALCIUM 600+D3 PO) Take 1 tablet by mouth daily.   clindamycin (CLEOCIN T) 1 % external solution APPLY TOPICALLY TO FACE AT NIGHT, BEFORE ADAPALENE, FOR ACNE   clobetasol cream (TEMOVATE) 0.05 % Apply to aa's BID up to two weeks. Avoid applying to face, groin, and axilla. Use as directed. Long-term use can cause thinning of the skin.   etonogestrel (NEXPLANON) 68 MG IMPL implant 1 each by Subdermal route once.   Fe Cbn-Fe Gluc-FA-B12-C-DSS (FERRALET 90) 90-1 MG TABS Take 1 tablet by mouth daily.   hydrOXYzine (ATARAX) 10 MG tablet Take 1-2 tablets (10-20 mg total) by mouth 2 (two) times daily as needed for anxiety. For severe anxiety   mometasone (ELOCON) 0.1 % cream Apply 1-2 times daily as needed for itch until rash clear. Avoid applying to face, groin, and axilla. Use as directed. Long-term use can cause thinning of the skin.   Polypodium Leucotomos (HELIOCARE PO) Take by mouth.    spironolactone (ALDACTONE) 100 MG tablet Take 1 tablet (100 mg total) by mouth at bedtime. For acne   spironolactone (ALDACTONE) 25 MG tablet TAKE 1 TABLET(25 MG) BY MOUTH IN THE MORNING   VITAMIN D PO Take by mouth.   [DISCONTINUED] meclizine (ANTIVERT) 12.5 MG tablet Take 1 tablet (12.5 mg total) by mouth 3 (three) times daily as needed for dizziness or nausea.   [DISCONTINUED] ondansetron (ZOFRAN-ODT) 4 MG disintegrating tablet Take 1 tablet (4 mg total) by mouth every 8 (eight) hours as needed for nausea or vomiting.   meclizine (ANTIVERT) 12.5 MG tablet Take 1 tablet (12.5 mg total) by mouth 3 (three) times daily as needed for dizziness or nausea.   ondansetron (ZOFRAN-ODT) 4 MG disintegrating tablet Take 1 tablet (4 mg total) by mouth every 8 (eight) hours as needed for nausea or vomiting.   [DISCONTINUED] Clascoterone (WINLEVI) 1 % CREA Apply to face twice daily for acne.   No facility-administered encounter medications on file as of 09/11/2023.    Surgical History: History reviewed. No pertinent surgical history.  Medical History: Past Medical History:  Diagnosis Date   Heart murmur    Migraine     Family History: Family History  Problem Relation Age of Onset   Hyperlipidemia Mother    Cancer Father        prostate and skin cancer   Hyperlipidemia Father    Heart disease Father  CAD - triple bypass 2014   Hypertension Father    Melanoma Father    Hearing loss Paternal Grandfather    Hypertension Paternal Grandfather    Anxiety disorder Brother    Breast cancer Neg Hx     Social History   Socioeconomic History   Marital status: Single    Spouse name: Not on file   Number of children: 0   Years of education: 16   Highest education level: Bachelor's degree (e.g., BA, AB, BS)  Occupational History   Occupation: DNA Administrator, arts: [employer]  Tobacco Use   Smoking status: Never   Smokeless tobacco: Never  Vaping Use   Vaping status: Never  Used  Substance and Sexual Activity   Alcohol use: Yes    Comment: once a week   Drug use: No   Sexual activity: Yes    Partners: Male    Birth control/protection: OCP    Comment: 1 partner   Other Topics Concern   Not on file  Social History Narrative   Ebony Lawson grew up outside of [place]. She attended [employer] and obtained a Tax adviser in [employer] in [place]. 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       Hobbies - plays trumpet   Exercise - none at present   Caffeine - rare    Social Drivers of Health   Financial Resource Strain: Low Risk  (02/15/2018)   Overall Financial Resource Strain (CARDIA)    Difficulty of Paying Living Expenses: Not hard at all  Food Insecurity: No Food Insecurity (02/15/2018)   Hunger Vital Sign    Worried About Running Out of Food in the Last Year: Never true    Ran Out of Food in the Last Year: Never true  Transportation Needs: No Transportation Needs (02/15/2018)   PRAPARE - Administrator, Civil Service (Medical): No    Lack of Transportation (Non-Medical): No  Physical Activity: Sufficiently Active (02/15/2018)   Exercise Vital Sign    Days of Exercise per Week: 4 days    Minutes of Exercise per Session: 50 min  Stress: Stress Concern Present (02/15/2018)   [employer] of Occupational Health - Occupational Stress Questionnaire    Feeling of Stress : Very much  Social Connections: Unknown (02/15/2018)   Social Connection and Isolation Panel [NHANES]    Frequency of Communication with Friends and Family: Not on file    Frequency of Social Gatherings with Friends and Family: Not on file    Attends Religious Services: More than 4 times per year    Active Member of [employer] or Organizations: Yes    Attends Banker Meetings: More than 4 times per year    Marital Status: Never married  Intimate Partner Violence: Not At Risk (02/15/2018)   Humiliation, Afraid, Rape, and Kick questionnaire    Fear of Current or  Ex-Partner: No    Emotionally Abused: No    Physically Abused: No    Sexually Abused: No      Review of Systems  Constitutional:  Negative for chills, fatigue and unexpected weight change.  HENT:  Negative for congestion, rhinorrhea, sneezing and sore throat.   Eyes:  Negative for redness.  Respiratory:  Negative for cough, chest tightness and shortness of breath.   Cardiovascular:  Negative for chest pain and palpitations.  Gastrointestinal:  Negative for abdominal pain, constipation, diarrhea, nausea and vomiting.  Genitourinary:  Negative for dysuria and  frequency.  Musculoskeletal:  Negative for arthralgias, back pain, joint swelling and neck pain.  Skin:  Negative for rash.  Neurological: Negative.  Negative for tremors and numbness.  Hematological:  Negative for adenopathy. Does not bruise/bleed easily.  Psychiatric/Behavioral:  Negative for behavioral problems (Depression), sleep disturbance and suicidal ideas. The patient is not nervous/anxious.     Vital Signs: BP 122/76   Pulse 78   Temp 98.2 F (36.8 C)   Resp 16   Ht 5' (1.524 m)   Wt 120 lb 9.6 oz (54.7 kg)   SpO2 99%   BMI 23.55 kg/m    Physical Exam Vitals reviewed.  Constitutional:      General: She is not in acute distress.    Appearance: Normal appearance. She is well-developed and normal weight. She is not ill-appearing or diaphoretic.  HENT:     Head: Normocephalic and atraumatic.  Neck:     Thyroid: No thyromegaly.     Vascular: No JVD.     Trachea: No tracheal deviation.  Cardiovascular:     Rate and Rhythm: Normal rate and regular rhythm.     Pulses: Normal pulses.     Heart sounds: Normal heart sounds. No murmur heard.    No friction rub. No gallop.  Pulmonary:     Effort: Pulmonary effort is normal. No respiratory distress.     Breath sounds: Normal breath sounds. No wheezing or rales.  Chest:     Chest wall: No tenderness.  Skin:    General: Skin is warm and dry.     Capillary  Refill: Capillary refill takes less than 2 seconds.  Neurological:     Mental Status: She is alert and oriented to person, place, and time.  Psychiatric:        Mood and Affect: Mood normal.        Behavior: Behavior normal.        Assessment/Plan: 1. Mixed hyperlipidemia (Primary) Limit red meat intake, increase lean protein in diet. Increase physical activity as tolerated.  2. Other iron deficiency anemia Stable, continue iron supplement twice weekly.  3. Erythrocytosis Resolved   4. Cyclical vomiting associated with nonintractable migraine Continue prn zofran as prescribed.  - ondansetron (ZOFRAN-ODT) 4 MG disintegrating tablet; Take 1 tablet (4 mg total) by mouth every 8 (eight) hours as needed for nausea or vomiting.  Dispense: 20 tablet; Refill: 5  5. Dizziness Continue prn meclizine as prescribed.  - meclizine (ANTIVERT) 12.5 MG tablet; Take 1 tablet (12.5 mg total) by mouth 3 (three) times daily as needed for dizziness or nausea.  Dispense: 90 tablet; Refill: 1   General Counseling: Zoei verbalizes understanding of the findings of todays visit and agrees with plan of treatment. I have discussed any further diagnostic evaluation that may be needed or ordered today. We also reviewed her medications today. she has been encouraged to call the office with any questions or concerns that should arise related to todays visit.    No orders of the defined types were placed in this encounter.   Meds ordered this encounter  Medications   meclizine (ANTIVERT) 12.5 MG tablet    Sig: Take 1 tablet (12.5 mg total) by mouth 3 (three) times daily as needed for dizziness or nausea.    Dispense:  90 tablet    Refill:  1    For future refills, do not fill unless patient calls for it   ondansetron (ZOFRAN-ODT) 4 MG disintegrating tablet    Sig: Take 1  tablet (4 mg total) by mouth every 8 (eight) hours as needed for nausea or vomiting.    Dispense:  20 tablet    Refill:  5     Return in about 6 months (around 03/12/2024) for F/U, Linda Biehn PCP.   Total time spent:30 Minutes Time spent includes review of chart, medications, test results, and follow up plan with the patient.    Controlled Substance Database was reviewed by me.  This patient was seen by Sallyanne Kuster, FNP-C in collaboration with Dr. Beverely Risen as a part of collaborative care agreement.   Kayci [place] R. Tedd Sias, MSN, FNP-C Internal medicine

## 2023-09-20 ENCOUNTER — Telehealth: Payer: Self-pay | Admitting: Nurse Practitioner

## 2023-09-20 NOTE — Telephone Encounter (Signed)
 Left another vm and sent message to patient to schedule 09/11/23's 6 month follow up-[name]

## 2024-01-01 ENCOUNTER — Ambulatory Visit: Payer: Managed Care, Other (non HMO) | Admitting: Dermatology

## 2024-01-15 ENCOUNTER — Ambulatory Visit: Payer: Managed Care, Other (non HMO) | Admitting: Dermatology

## 2024-01-27 ENCOUNTER — Encounter: Payer: Self-pay | Admitting: Family Medicine

## 2024-01-30 ENCOUNTER — Ambulatory Visit (INDEPENDENT_AMBULATORY_CARE_PROVIDER_SITE_OTHER)

## 2024-01-30 DIAGNOSIS — D1801 Hemangioma of skin and subcutaneous tissue: Secondary | ICD-10-CM

## 2024-01-30 DIAGNOSIS — L719 Rosacea, unspecified: Secondary | ICD-10-CM

## 2024-01-30 DIAGNOSIS — Z1283 Encounter for screening for malignant neoplasm of skin: Secondary | ICD-10-CM | POA: Diagnosis not present

## 2024-01-30 DIAGNOSIS — D492 Neoplasm of unspecified behavior of bone, soft tissue, and skin: Secondary | ICD-10-CM

## 2024-01-30 DIAGNOSIS — L821 Other seborrheic keratosis: Secondary | ICD-10-CM

## 2024-01-30 DIAGNOSIS — L700 Acne vulgaris: Secondary | ICD-10-CM

## 2024-01-30 DIAGNOSIS — D220 Melanocytic nevi of lip: Secondary | ICD-10-CM | POA: Diagnosis not present

## 2024-01-30 DIAGNOSIS — D485 Neoplasm of uncertain behavior of skin: Secondary | ICD-10-CM | POA: Diagnosis not present

## 2024-01-30 DIAGNOSIS — L578 Other skin changes due to chronic exposure to nonionizing radiation: Secondary | ICD-10-CM

## 2024-01-30 DIAGNOSIS — D229 Melanocytic nevi, unspecified: Secondary | ICD-10-CM

## 2024-01-30 DIAGNOSIS — Z808 Family history of malignant neoplasm of other organs or systems: Secondary | ICD-10-CM

## 2024-01-30 DIAGNOSIS — W908XXA Exposure to other nonionizing radiation, initial encounter: Secondary | ICD-10-CM

## 2024-01-30 DIAGNOSIS — L814 Other melanin hyperpigmentation: Secondary | ICD-10-CM | POA: Diagnosis not present

## 2024-01-30 MED ORDER — SPIRONOLACTONE 25 MG PO TABS: 25.0000 mg | ORAL_TABLET | Freq: Every day | ORAL | 1 refills | Status: AC

## 2024-01-30 MED ORDER — SPIRONOLACTONE 100 MG PO TABS: 100.0000 mg | ORAL_TABLET | Freq: Every day | ORAL | 1 refills | Status: AC

## 2024-01-30 MED ORDER — AZELAIC ACID 15 % EX GEL: CUTANEOUS | 6 refills | Status: AC

## 2024-01-30 MED ORDER — ADAPALENE 0.3 % EX GEL: CUTANEOUS | 2 refills | Status: AC

## 2024-01-30 NOTE — Patient Instructions (Addendum)
 Wound Care Instructions  Cleanse wound gently with soap and water once a day then pat dry with clean gauze. Apply a thin coat of Petrolatum (petroleum jelly, Vaseline) over the wound (unless you have an allergy to this). We recommend that you use a new, sterile tube of Vaseline. Do not pick or remove scabs. Do not remove the yellow or white healing tissue from the base of the wound.  Cover the wound with fresh, clean, nonstick gauze and secure with paper tape. You may use Band-Aids in place of gauze and tape if the wound is small enough, but would recommend trimming much of the tape off as there is often too much. Sometimes Band-Aids can irritate the skin.  You should call the office for your biopsy report after 1 week if you have not already been contacted.  If you experience any problems, such as abnormal amounts of bleeding, swelling, significant bruising, significant pain, or evidence of infection, please call the office immediately.  FOR ADULT SURGERY PATIENTS: If you need something for pain relief you may take 1 extra strength Tylenol  (acetaminophen ) AND 2 Ibuprofen (200mg  each) together every 4 hours as needed for pain. (do not take these if you are allergic to them or if you have a reason you should not take them.) Typically, you may only need pain medication for 1 to 3 days.     Melanoma ABCDEs  Melanoma is the most dangerous type of skin cancer, and is the leading cause of death from skin disease.  You are more likely to develop melanoma if you: Have light-colored skin, light-colored eyes, or red or blond hair Spend a lot of time in the sun Tan regularly, either outdoors or in a tanning bed Have had blistering sunburns, especially during childhood Have a close family member who has had a melanoma Have atypical moles or large birthmarks  Early detection of melanoma is key since treatment is typically straightforward and cure rates are extremely high if we catch it early.   The  first sign of melanoma is often a change in a mole or a new dark spot.  The ABCDE system is a way of remembering the signs of melanoma.  A for asymmetry:  The two halves do not match. B for border:  The edges of the growth are irregular. C for color:  A mixture of colors are present instead of an even brown color. D for diameter:  Melanomas are usually (but not always) greater than 6mm - the size of a pencil eraser. E for evolution:  The spot keeps changing in size, shape, and color.  Please check your skin once per month between visits. You can use a small mirror in front and a large mirror behind you to keep an eye on the back side or your body.   If you see any new or changing lesions before your next follow-up, please call to schedule a visit.  Please continue daily skin protection including broad spectrum sunscreen SPF 30+ to sun-exposed areas, reapplying every 2 hours as needed when you're outdoors.    Due to recent changes in healthcare laws, you may see results of your pathology and/or laboratory studies on MyChart before the doctors have had a chance to review them. We understand that in some cases there may be results that are confusing or concerning to you. Please understand that not all results are received at the same time and often the doctors may need to interpret multiple results in order to  provide you with the best plan of care or course of treatment. Therefore, we ask that you please give us  2 business days to thoroughly review all your results before contacting the office for clarification. Should we see a critical lab result, you will be contacted sooner.   If You Need Anything After Your Visit  If you have any questions or concerns for your doctor, please call our main line at [phone] and press option 4 to reach your doctor's medical assistant. If no one answers, please leave a voicemail as directed and we will return your call as soon as possible. Messages left after 4  pm will be answered the following business day.   You may also send us  a message via MyChart. We typically respond to MyChart messages within 1-2 business days.  For prescription refills, please ask your pharmacy to contact our office. Our fax number is [phone].  If you have an urgent issue when the clinic is closed that cannot wait until the next business day, you can page your doctor at the number below.    Please note that while we do our best to be available for urgent issues outside of office hours, we are not available 24/7.   If you have an urgent issue and are unable to reach us , you may choose to seek medical care at your doctor's office, retail clinic, urgent care center, or emergency room.  If you have a medical emergency, please immediately call 911 or go to the emergency department.  Pager Numbers  - Dr. Hester: [phone]  - Dr. Jackquline: [phone]  - Dr. Claudene: [phone]   - Dr. Raymund: [phone]  In the event of inclement weather, please call our main line at [phone] for an update on the status of any delays or closures.  Dermatology Medication Tips: Please keep the boxes that topical medications come in in order to help keep track of the instructions about where and how to use these. Pharmacies typically print the medication instructions only on the boxes and not directly on the medication tubes.   If your medication is too expensive, please contact our office at [phone] option 4 or send us  a message through MyChart.   We are unable to tell what your co-pay for medications will be in advance as this is different depending on your insurance coverage. However, we may be able to find a substitute medication at lower cost or fill out paperwork to get insurance to cover a needed medication.   If a prior authorization is required to get your medication covered by your insurance company, please allow us  1-2 business days to complete this  process.  Drug prices often vary depending on where the prescription is filled and some pharmacies may offer cheaper prices.  The website www.goodrx.com contains coupons for medications through different pharmacies. The prices here do not account for what the cost may be with help from insurance (it may be cheaper with your insurance), but the website can give you the price if you did not use any insurance.  - You can print the associated coupon and take it with your prescription to the pharmacy.  - You may also stop by our office during regular business hours and pick up a GoodRx coupon card.  - If you need your prescription sent electronically to a different pharmacy, notify our office through [facility] or by phone at [phone] option 4.     Si Usted Necesita Algo Despus de Su Visita  Tambin puede enviarnos un mensaje a travs de MyChart. Por lo general respondemos a los mensajes de MyChart en el transcurso de 1 a 2 das hbiles.  Para renovar recetas, por favor pida a su farmacia que se ponga en contacto con nuestra oficina. Randi lakes de fax es [place] [phone].  Si tiene un asunto urgente cuando la clnica est cerrada y que no puede esperar hasta el siguiente da hbil, puede llamar/localizar a su doctor(a) al nmero que aparece a continuacin.   Por favor, tenga en cuenta que aunque hacemos todo lo posible para estar disponibles para asuntos urgentes fuera del horario de [place], no estamos disponibles las 24 horas del da, los 7 [address]  [address] de la [place].   Si tiene un problema urgente y no puede comunicarse con nosotros, puede optar por buscar atencin mdica  en el consultorio de su doctor(a), en una clnica privada, en un centro de atencin urgente o en una sala de emergencias.  Si tiene Engineer, drilling, por favor llame inmediatamente al 911 o vaya a la sala de emergencias.  Nmeros de bper  - Dr. Hester: [phone]  - Dra. Jackquline: [phone]  - Dr.  Claudene: [phone]  - Dra. Kitts: [phone]  En caso de inclemencias del [place], por favor llame a nuestra lnea principal al [phone] para una actualizacin sobre el estado de cualquier retraso o cierre.  Consejos para la medicacin en dermatologa: Por favor, guarde las cajas en las que vienen los medicamentos de uso tpico para ayudarle a seguir las instrucciones sobre dnde y cmo usarlos. Las farmacias generalmente imprimen las instrucciones del medicamento slo en las cajas y no directamente en los tubos del [place].   Si su medicamento es muy caro, por favor, pngase en contacto con landry rieger llamando al [phone] y presione la opcin 4 o envenos un mensaje a travs de Clinical cytogeneticist.   No podemos decirle cul ser su copago por los medicamentos por adelantado ya que esto es diferente dependiendo de la cobertura de su seguro. Sin embargo, es posible que podamos encontrar un medicamento sustituto a Audiological scientist un formulario para que el seguro cubra el medicamento que se considera necesario.   Si se requiere una autorizacin previa para que su compaa de seguros malta su medicamento, por favor permtanos de 1 a 2 das hbiles para completar este proceso.  Los precios de los medicamentos varan con frecuencia dependiendo del Environmental consultant de dnde se surte la receta y alguna farmacias pueden ofrecer precios ms baratos.  El sitio web www.goodrx.com tiene cupones para medicamentos de Health and safety inspector. Los precios aqu no tienen en cuenta lo que podra costar con la ayuda del seguro (puede ser ms barato con su seguro), pero el sitio web puede darle el precio si no utiliz Tourist information centre manager.  - Puede imprimir el cupn correspondiente y llevarlo con su receta a la farmacia.  - Tambin puede pasar por nuestra oficina durante el horario de atencin regular y Education officer, museum una tarjeta de cupones de GoodRx.  - Si necesita que su receta se enve electrnicamente a una farmacia diferente,  informe a nuestra oficina a travs de MyChart de [place] o por telfono llamando al [phone] y presione la opcin 4.

## 2024-01-30 NOTE — Addendum Note (Signed)
 Addended by: LAURA CASTELLANI on: 01/30/2024 04:49 PM   Modules accepted: Orders

## 2024-01-30 NOTE — Progress Notes (Signed)
 Follow-Up Visit   Subjective  Ebony Lawson is a 38 y.o. female who presents for the following: Skin Cancer Screening and Full Body Skin Exam  6 months f/u on acne  treating with Finacea  gel, Clindamycin  sol and taking Spironolactone  tablets with a good response  The patient presents for Total-Body Skin Exam (TBSE) for skin cancer screening and mole check. The patient has spots, moles and lesions to be evaluated, some may be new or changing and the patient may have concern these could be cancer.    The following portions of the chart were reviewed this encounter and updated as appropriate: medications, allergies, medical history  Review of Systems:  No other skin or systemic complaints except as noted in HPI or Assessment and Plan.  Objective  Well appearing patient in no apparent distress; mood and affect are within normal limits.  A full examination was performed including scalp, head, eyes, ears, nose, lips, neck, chest, axillae, abdomen, back, buttocks, bilateral upper extremities, bilateral lower extremities, hands, feet, fingers, toes, fingernails, and toenails. All findings within normal limits unless otherwise noted below.   - 6-20 mm pigmented macules that are tan to brown in color and are somewhat non-uniform in shape and concentrated in the sun-exposed areas Nevi - well demarcated brown macules   Few comedones   Relevant physical exam findings are noted in the Assessment and Plan.  left lower vermillion border 0.3 cm pink papule      Assessment & Plan   SKIN CANCER SCREENING PERFORMED TODAY.  ACTINIC DAMAGE - Chronic condition, secondary to cumulative UV/sun exposure - Recommend daily broad spectrum sunscreen SPF 30+ to sun-exposed areas, reapply every 2 hours as needed.  - Staying in the shade or wearing long sleeves, sun glasses (UVA+UVB protection) and wide brim hats (4-inch brim around the entire circumference of the hat) are also recommended for sun  protection.  - Call for new or changing lesions.  LENTIGINES, SEBORRHEIC KERATOSES, HEMANGIOMAS - Benign normal skin lesions - Benign-appearing - Call for any changes  MELANOCYTIC NEVI Family hx of melanoma  - Tan-brown and/or pink-flesh-colored symmetric macules and papules - Benign appearing on exam today - Observation - Call clinic for new or changing moles - Recommend daily use of broad spectrum spf 30+ sunscreen to sun-exposed areas.   ACNE VULGARIS with component of Rosacea Exam: erythema at cheeks, nose with few scattered closed comedones, inflamed comedones at forehead, otherwise clear   Chronic and persistent condition with duration or expected duration over one year. Condition is at patient goal.    Treatment Plan: Continue Finacea  15% gel in the morning Continue  adapalene  0.3% gel at bedtime  May continue clindamycin  solution as needed for break outs in the morning Continue Spironolactone  25 mg Po qam and 100 mg Po at bedtime.  Spironolactone  can cause increased urination and cause blood pressure to decrease. Please watch for signs of lightheadedness and be cautious when changing position. It can sometimes cause breast tenderness or an irregular period in premenopausal women. It can also increase potassium. The increase in potassium usually is not a concern unless you are taking other medicines that also increase potassium, so please be sure your doctor knows all of the other medications you are taking. This medication should not be taken by pregnant women.  This medicine should also not be taken together with sulfa  drugs like Bactrim  (trimethoprim /sulfamethexazole).    NEOPLASM OF SKIN left lower vermillion border Skin / nail biopsy Type of biopsy: tangential  Informed consent: discussed and consent obtained   Patient was prepped and draped in usual sterile fashion: area prepped with alochol. Anesthesia: the lesion was anesthetized in a standard fashion   Anesthetic:   1% lidocaine w/ epinephrine 1-100,000 buffered w/ 8.4% NaHCO3 Instrument used: flexible razor blade   Hemostasis achieved with: pressure, aluminum chloride and electrodesiccation   Outcome: patient tolerated procedure well   Post-procedure details: wound care instructions given   Post-procedure details comment:  Ointment and small bandage  Related Procedures Anatomic Pathology Report SKIN EXAM FOR MALIGNANT NEOPLASM   LENTIGO   SEBORRHEIC KERATOSIS   CHERRY ANGIOMA   MULTIPLE BENIGN NEVI   NEOPLASM OF UNCERTAIN BEHAVIOR OF SKIN   FAMILY HISTORY OF MELANOMA   ACNE VULGARIS   Related Medications clindamycin  (CLEOCIN  T) 1 % external solution APPLY TOPICALLY TO FACE AT NIGHT, BEFORE ADAPALENE , FOR ACNE Adapalene  0.3 % gel APPLY PEA SIZED AMOUNT TOPICALLY TO FACE EVERY NIGHT Azelaic Acid  15 % gel After skin is thoroughly washed and patted dry, gently but thoroughly massage a thin film of azelaic acid  cream into the affected area twice daily, in the morning and evening. spironolactone  (ALDACTONE ) 25 MG tablet Take 1 tablet (25 mg total) by mouth daily.   Return in about 1 year (around 01/29/2025) for TBSE.  IFay Kirks, CMA, am acting as scribe for Lauraine JAYSON Kanaris, MD .   Documentation: I have reviewed the above documentation for accuracy and completeness, and I agree with the above.  Lauraine JAYSON Kanaris, MD

## 2024-02-12 ENCOUNTER — Ambulatory Visit: Payer: Self-pay

## 2024-02-12 LAB — ANATOMIC PATHOLOGY REPORT

## 2024-02-12 LAB — SPECIMEN STATUS REPORT

## 2024-02-12 NOTE — Telephone Encounter (Signed)
 Left message and sent MyChart message. aw

## 2024-02-12 NOTE — Telephone Encounter (Signed)
-----   Message from Lauraine JAYSON Kanaris sent at 02/12/2024  8:33 AM EDT ----- SUPERFICIAL  PORTION OF LENTIGINOUS COMPOUND MELANOCYTIC NEVUS.   Please notify patient with below plan: Benign, observe.   ----- Message ----- From: Interface, [facility] Lab Results In Sent: 02/12/2024   7:36 AM EDT To: Lauraine JAYSON Kanaris, MD

## 2024-02-14 ENCOUNTER — Encounter: Payer: Self-pay | Admitting: Nurse Practitioner

## 2024-02-18 MED ORDER — COVID-19 MRNA VAC-TRIS(PFIZER) 30 MCG/0.3ML IM SUSY: 0.3000 mL | PREFILLED_SYRINGE | Freq: Once | INTRAMUSCULAR | 0 refills | Status: DC | PRN

## 2024-03-20 ENCOUNTER — Encounter: Payer: Self-pay | Admitting: Nurse Practitioner

## 2024-03-20 DIAGNOSIS — E559 Vitamin D deficiency, unspecified: Secondary | ICD-10-CM

## 2024-03-20 DIAGNOSIS — E538 Deficiency of other specified B group vitamins: Secondary | ICD-10-CM

## 2024-03-20 DIAGNOSIS — D508 Other iron deficiency anemias: Secondary | ICD-10-CM

## 2024-03-20 DIAGNOSIS — E782 Mixed hyperlipidemia: Secondary | ICD-10-CM

## 2024-03-20 DIAGNOSIS — R5383 Other fatigue: Secondary | ICD-10-CM

## 2024-03-22 LAB — B12 AND FOLATE PANEL
Folate: 13.4 ng/mL (ref >3.0)
Vitamin B-12: 493 pg/mL (ref 232–1245)

## 2024-03-22 LAB — IRON,TIBC AND FERRITIN PANEL
Ferritin: 62 ng/mL (ref 15–150)
Iron Saturation: 11 % — ABNORMAL LOW (ref 15–55)
Iron: 37 ug/dL (ref 27–159)
Total Iron Binding Capacity: 333 ug/dL (ref 250–450)
UIBC: 296 ug/dL (ref 131–425)

## 2024-03-22 LAB — CBC WITH DIFFERENTIAL/PLATELET
Basophils Absolute: 0 x10E3/uL (ref 0.0–0.2)
Basos: 0 %
EOS (ABSOLUTE): 0.1 x10E3/uL (ref 0.0–0.4)
Eos: 1 %
Hematocrit: 47.6 % — ABNORMAL HIGH (ref 34.0–46.6)
Hemoglobin: 15.8 g/dL (ref 11.1–15.9)
Immature Grans (Abs): 0 x10E3/uL (ref 0.0–0.1)
Immature Granulocytes: 0 %
Lymphocytes Absolute: 1.7 x10E3/uL (ref 0.7–3.1)
Lymphs: 25 %
MCH: 28.6 pg (ref 26.6–33.0)
MCHC: 33.2 g/dL (ref 31.5–35.7)
MCV: 86 fL (ref 79–97)
Monocytes Absolute: 0.4 x10E3/uL (ref 0.1–0.9)
Monocytes: 6 %
Neutrophils Absolute: 4.7 x10E3/uL (ref 1.4–7.0)
Neutrophils: 68 %
Platelets: 235 x10E3/uL (ref 150–450)
RBC: 5.53 x10E6/uL — ABNORMAL HIGH (ref 3.77–5.28)
RDW: 12.3 % (ref 11.7–15.4)
WBC: 6.9 x10E3/uL (ref 3.4–10.8)

## 2024-03-22 LAB — CMP14+EGFR
ALT: 12 IU/L (ref 0–32)
AST: 16 IU/L (ref 0–40)
Albumin: 4.9 g/dL (ref 3.9–4.9)
Alkaline Phosphatase: 53 IU/L (ref 41–116)
BUN/Creatinine Ratio: 14 (ref 9–23)
BUN: 10 mg/dL (ref 6–20)
Bilirubin Total: 0.4 mg/dL (ref 0.0–1.2)
CO2: 21 mmol/L (ref 20–29)
Calcium: 9.8 mg/dL (ref 8.7–10.2)
Chloride: 101 mmol/L (ref 96–106)
Creatinine, Ser: 0.7 mg/dL (ref 0.57–1.00)
Globulin, Total: 2.5 g/dL (ref 1.5–4.5)
Glucose: 83 mg/dL (ref 70–99)
Potassium: 4.4 mmol/L (ref 3.5–5.2)
Sodium: 138 mmol/L (ref 134–144)
Total Protein: 7.4 g/dL (ref 6.0–8.5)
eGFR: 115 mL/min/1.73 (ref >59)

## 2024-03-22 LAB — LIPID PANEL
Chol/HDL Ratio: 4.1 ratio (ref 0.0–4.4)
Cholesterol, Total: 172 mg/dL (ref 100–199)
HDL: 42 mg/dL (ref >39)
LDL Chol Calc (NIH): 119 mg/dL — ABNORMAL HIGH (ref 0–99)
Triglycerides: 54 mg/dL (ref 0–149)
VLDL Cholesterol Cal: 11 mg/dL (ref 5–40)

## 2024-03-22 LAB — VITAMIN D 25 HYDROXY (VIT D DEFICIENCY, FRACTURES): Vit D, 25-Hydroxy: 39.2 ng/mL (ref 30.0–100.0)

## 2024-03-27 ENCOUNTER — Encounter: Payer: Self-pay | Admitting: Nurse Practitioner

## 2024-03-27 ENCOUNTER — Ambulatory Visit (INDEPENDENT_AMBULATORY_CARE_PROVIDER_SITE_OTHER): Admitting: Nurse Practitioner

## 2024-03-27 VITALS — BP 118/74 | HR 88 | Temp 97.0°F | Resp 16 | Ht 60.0 in | Wt 122.2 lb

## 2024-03-27 DIAGNOSIS — R718 Other abnormality of red blood cells: Secondary | ICD-10-CM | POA: Diagnosis not present

## 2024-03-27 DIAGNOSIS — E782 Mixed hyperlipidemia: Secondary | ICD-10-CM

## 2024-03-27 DIAGNOSIS — R790 Abnormal level of blood mineral: Secondary | ICD-10-CM

## 2024-03-27 DIAGNOSIS — E611 Iron deficiency: Secondary | ICD-10-CM

## 2024-03-27 DIAGNOSIS — D751 Secondary polycythemia: Secondary | ICD-10-CM | POA: Diagnosis not present

## 2024-03-27 NOTE — Progress Notes (Signed)
 [facility] [address] [place], [place] [address]  Internal MEDICINE  Office Visit Note  Patient Name: Ebony Lawson  [id number]  [id number]  Date of Service: 03/27/2024  Chief Complaint  Patient presents with   Follow-up    HPI Ebony Lawson presents for a follow-up visit for lab results.  Erythrocytosis, elevated RBC, and HCT. Low iron saturation, borderline high normal HGB, borderline low normal iron.  High cholesterol -- improved some, all levels are normal except mildly elevated LDL.     Current Medication: Outpatient Encounter Medications as of 03/27/2024  Medication Sig   Polypodium Leucotomos (HELIOCARE PO) Take by mouth.   Adapalene  0.3 % gel APPLY PEA SIZED AMOUNT TOPICALLY TO FACE EVERY NIGHT   Azelaic Acid  15 % gel After skin is thoroughly washed and patted dry, gently but thoroughly massage a thin film of azelaic acid  cream into the affected area twice daily, in the morning and evening.   Calcium Carbonate-Vitamin D  (CALCIUM 600+D3 PO) Take 1 tablet by mouth daily.   clindamycin  (CLEOCIN  T) 1 % external solution APPLY TOPICALLY TO FACE AT NIGHT, BEFORE ADAPALENE , FOR ACNE   COVID-19 mRNA vaccine, Pfizer, (COMIRNATY) syringe Inject 0.3 mLs into the muscle once as needed for up to 1 dose (covid vaccination).   etonogestrel  (NEXPLANON ) 68 MG IMPL implant 1 each by Subdermal route once.   Fe Cbn-Fe Gluc-FA-B12-C-DSS (FERRALET  90) 90-1 MG TABS Take 1 tablet by mouth daily.   hydrOXYzine  (ATARAX ) 10 MG tablet Take 1-2 tablets (10-20 mg total) by mouth 2 (two) times daily as needed for anxiety. For severe anxiety   meclizine  (ANTIVERT ) 12.5 MG tablet Take 1 tablet (12.5 mg total) by mouth 3 (three) times daily as needed for dizziness or nausea.   ondansetron  (ZOFRAN -ODT) 4 MG disintegrating tablet Take 1 tablet (4 mg total) by mouth every 8 (eight) hours as needed for nausea or vomiting.   spironolactone  (ALDACTONE ) 100 MG tablet Take 1 tablet (100 mg total) by mouth  at bedtime. For acne   spironolactone  (ALDACTONE ) 25 MG tablet Take 1 tablet (25 mg total) by mouth daily.   [DISCONTINUED] clobetasol  cream (TEMOVATE ) 0.05 % Apply to aa's BID up to two weeks. Avoid applying to face, groin, and axilla. Use as directed. Long-term use can cause thinning of the skin. (Patient not taking: Reported on 03/27/2024)   [DISCONTINUED] mometasone  (ELOCON ) 0.1 % cream Apply 1-2 times daily as needed for itch until rash clear. Avoid applying to face, groin, and axilla. Use as directed. Long-term use can cause thinning of the skin.   [DISCONTINUED] VITAMIN D  PO Take by mouth. (Patient not taking: Reported on 03/27/2024)   No facility-administered encounter medications on file as of 03/27/2024.    Surgical History: History reviewed. No pertinent surgical history.  Medical History: Past Medical History:  Diagnosis Date   Heart murmur    Migraine     Family History: Family History  Problem Relation Age of Onset   Hyperlipidemia Mother    Cancer Father        prostate and skin cancer   Hyperlipidemia Father    Heart disease Father        CAD - triple bypass 2014   Hypertension Father    Melanoma Father    Hearing loss Paternal Grandfather    Hypertension Paternal Grandfather    Anxiety disorder Brother    Breast cancer Neg Hx     Social History   Socioeconomic History   Marital status: Single  Spouse name: Not on file   Number of children: 0   Years of education: 16   Highest education level: Bachelor's degree (e.g., BA, AB, BS)  Occupational History   Occupation: DNA Administrator, arts: [employer]  Tobacco Use   Smoking status: Never   Smokeless tobacco: Never  Vaping Use   Vaping status: Never Used  Substance and Sexual Activity   Alcohol use: Yes    Comment: once a week   Drug use: No   Sexual activity: Yes    Partners: Male    Birth control/protection: OCP    Comment: 1 partner   Other Topics Concern   Not on file  Social  History Narrative   Ebony Lawson grew up outside of [place]. She attended [employer] and obtained a Tax adviser in [employer] in [place]. 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       Hobbies - plays trumpet   Exercise - none at present   Caffeine - rare    Social Drivers of Lawson   Financial Resource Strain: Low Risk  (02/15/2018)   Overall Financial Resource Strain (CARDIA)    Difficulty of Paying Living Expenses: Not hard at all  Food Insecurity: No Food Insecurity (02/15/2018)   Hunger Vital Sign    Worried About Running Out of Food in the Last Year: Never true    Ran Out of Food in the Last Year: Never true  Transportation Needs: No Transportation Needs (02/15/2018)   PRAPARE - Administrator, Civil Service (Medical): No    Lack of Transportation (Non-Medical): No  Physical Activity: Sufficiently Active (02/15/2018)   Exercise Vital Sign    Days of Exercise per Week: 4 days    Minutes of Exercise per Session: 50 min  Stress: Stress Concern Present (02/15/2018)   Ebony Lawson - Occupational Stress Questionnaire    Feeling of Stress : Very much  Social Connections: Unknown (02/15/2018)   Social Connection and Isolation Panel    Frequency of Communication with Friends and Family: Not on file    Frequency of Social Gatherings with Friends and Family: Not on file    Attends Religious Services: More than 4 times per year    Active Member of [employer] or Organizations: Yes    Attends Banker Meetings: More than 4 times per year    Marital Status: Never married  Intimate Partner Violence: Not At Risk (02/15/2018)   Humiliation, Afraid, Rape, and Kick questionnaire    Fear of Current or Ex-Partner: No    Emotionally Abused: No    Physically Abused: No    Sexually Abused: No      Review of Systems  Constitutional:  Negative for chills, fatigue and unexpected weight change.  HENT:  Negative for congestion, rhinorrhea, sneezing and sore  throat.   Eyes:  Negative for redness.  Respiratory:  Negative for cough, chest tightness and shortness of breath.   Cardiovascular:  Negative for chest pain and palpitations.  Gastrointestinal:  Negative for abdominal pain, constipation, diarrhea, nausea and vomiting.  Genitourinary:  Negative for dysuria and frequency.  Musculoskeletal:  Negative for arthralgias, back pain, joint swelling and neck pain.  Skin:  Negative for rash.  Neurological: Negative.  Negative for tremors and numbness.  Hematological:  Negative for adenopathy. Does not bruise/bleed easily.  Psychiatric/Behavioral:  Negative for behavioral problems (Depression), sleep disturbance and suicidal ideas. The patient is not nervous/anxious.  Vital Signs: BP 118/74   Pulse 88   Temp (!) 97 F (36.1 C)   Resp 16   Ht 5' (1.524 m)   Wt 122 lb 3.2 oz (55.4 kg)   SpO2 98%   BMI 23.87 kg/m    Physical Exam Vitals reviewed.  Constitutional:      General: She is not in acute distress.    Appearance: Normal appearance. She is well-developed and normal weight. She is not ill-appearing or diaphoretic.  HENT:     Head: Normocephalic and atraumatic.  Neck:     Thyroid: No thyromegaly.     Vascular: No JVD.     Trachea: No tracheal deviation.  Cardiovascular:     Rate and Rhythm: Normal rate and regular rhythm.     Pulses: Normal pulses.     Heart sounds: Normal heart sounds. No murmur heard.    No friction rub. No gallop.  Pulmonary:     Effort: Pulmonary effort is normal. No respiratory distress.     Breath sounds: Normal breath sounds. No wheezing or rales.  Chest:     Chest wall: No tenderness.  Skin:    General: Skin is warm and dry.     Capillary Refill: Capillary refill takes less than 2 seconds.  Neurological:     Mental Status: She is alert and oriented to person, place, and time.  Psychiatric:        Mood and Affect: Mood normal.        Behavior: Behavior normal.        Assessment/Plan: 1.  Erythrocytosis (Primary) Referred to heme/onc for further evaluation  - Ambulatory referral to Hematology / Oncology  2. Elevated hematocrit Referred to heme/onc for further evaluation  - Ambulatory referral to Hematology / Oncology  3. Iron deficiency Referred to heme/onc for further evaluation  - Ambulatory referral to Hematology / Oncology  4. Abnormal iron saturation Referred to heme/onc for further evaluation  - Ambulatory referral to Hematology / Oncology  5. Mixed hyperlipidemia Continue eating more lean proteins in diet. May add fish oil supplement if desired.    General Counseling: Nailani verbalizes understanding of the findings of todays visit and agrees with plan of treatment. I have discussed any further diagnostic evaluation that may be needed or ordered today. We also reviewed her medications today. she has been encouraged to call the office with any questions or concerns that should arise related to todays visit.    Orders Placed This Encounter  Procedures   Ambulatory referral to Hematology / Oncology    No orders of the defined types were placed in this encounter.   Return for previously scheduled, CPE, Cordell Coke PCP next year and otherwise as needed. .   Total time spent:30 Minutes Time spent includes review of chart, medications, test results, and follow up plan with the patient.   [place] Controlled Substance Database was reviewed by me.  This patient was seen by Mardy Maxin, FNP-C in collaboration with Dr. Sigrid Bathe as a part of collaborative care agreement.   Isabelly Kobler R. Maxin, MSN, FNP-C Internal medicine

## 2024-04-08 ENCOUNTER — Inpatient Hospital Stay

## 2024-04-08 ENCOUNTER — Encounter: Payer: Self-pay | Admitting: Oncology

## 2024-04-08 ENCOUNTER — Inpatient Hospital Stay: Attending: Oncology | Admitting: Oncology

## 2024-04-08 VITALS — BP 140/85 | HR 82 | Temp 98.2°F | Resp 18 | Ht 60.0 in | Wt 122.9 lb

## 2024-04-08 DIAGNOSIS — Z808 Family history of malignant neoplasm of other organs or systems: Secondary | ICD-10-CM | POA: Insufficient documentation

## 2024-04-08 DIAGNOSIS — D751 Secondary polycythemia: Secondary | ICD-10-CM | POA: Diagnosis present

## 2024-04-08 NOTE — Progress Notes (Unsigned)
 [facility]  Telephone:([phone]) [phone] Fax:(336) [phone]  ID: Ebony Lawson OB: [birth year]  MR#: [id number]  RDW#:[id number]  Patient Care Team: Liana Fish, NP as PCP - General (Nurse Practitioner) Juan Tawni SAUNDERS, LCSW as Social Worker (Licensed Clinical Social Worker) Fernand, Sigrid HERO, MD (Internal Medicine)  CHIEF COMPLAINT: Erythrocytosis.  INTERVAL HISTORY: Patient is a 38 year old female who is noted to have a mildly increased red blood cell count on routine blood work.  She is referred for further evaluation.  She currently feels well and is asymptomatic.  She has no neurologic complaints.  She denies any recent fevers or illnesses.  She has a good appetite and denies weight loss.  She has no chest pain, shortness of breath, cough, or hemoptysis.  She denies any nausea, vomiting, constipation, or diarrhea.  She has no urinary complaints.  Patient offers no specific complaints today.  REVIEW OF SYSTEMS:   Review of Systems  Constitutional: Negative.  Negative for fever, malaise/fatigue and weight loss.  Respiratory: Negative.  Negative for cough, hemoptysis and shortness of breath.   Cardiovascular: Negative.  Negative for chest pain and leg swelling.  Gastrointestinal: Negative.  Negative for abdominal pain.  Genitourinary: Negative.  Negative for dysuria.  Musculoskeletal: Negative.  Negative for back pain.  Skin: Negative.  Negative for rash.  Neurological: Negative.  Negative for dizziness, focal weakness, weakness and headaches.  Psychiatric/Behavioral: Negative.  The patient is not nervous/anxious.     As per HPI. Otherwise, a complete review of systems is negative.  PAST MEDICAL HISTORY: Past Medical History:  Diagnosis Date   Heart murmur    Migraine     PAST SURGICAL HISTORY: History reviewed. No pertinent surgical history.  FAMILY HISTORY: Family History  Problem Relation Age of Onset   Hyperlipidemia Mother    Cancer Father         prostate and skin cancer   Hyperlipidemia Father    Heart disease Father        CAD - triple bypass 2014   Hypertension Father    Melanoma Father    Hearing loss Paternal Grandfather    Hypertension Paternal Grandfather    Anxiety disorder Brother    Breast cancer Neg Hx     ADVANCED DIRECTIVES (Y/N):  N  HEALTH MAINTENANCE: Social History   Tobacco Use   Smoking status: Never   Smokeless tobacco: Never  Vaping Use   Vaping status: Never Used  Substance Use Topics   Alcohol use: Yes    Comment: once a week   Drug use: No     Colonoscopy:  PAP:  Bone density:  Lipid panel:  No Known Allergies  Current Outpatient Medications  Medication Sig Dispense Refill   Adapalene  0.3 % gel APPLY PEA SIZED AMOUNT TOPICALLY TO FACE EVERY NIGHT 135 g 2   Azelaic Acid  15 % gel After skin is thoroughly washed and patted dry, gently but thoroughly massage a thin film of azelaic acid  cream into the affected area twice daily, in the morning and evening. 50 g 6   Calcium Carbonate-Vitamin D  (CALCIUM 600+D3 PO) Take 1 tablet by mouth daily.     clindamycin  (CLEOCIN  T) 1 % external solution APPLY TOPICALLY TO FACE AT NIGHT, BEFORE ADAPALENE , FOR ACNE 180 mL 2   COVID-19 mRNA vaccine, Pfizer, (COMIRNATY) syringe Inject 0.3 mLs into the muscle once as needed for up to 1 dose (covid vaccination). 0.3 mL 0   etonogestrel  (NEXPLANON ) 68 MG IMPL implant 1 each by Subdermal  route once.     Fe Cbn-Fe Gluc-FA-B12-C-DSS (FERRALET  90) 90-1 MG TABS Take 1 tablet by mouth daily. 30 tablet 5   hydrOXYzine  (ATARAX ) 10 MG tablet Take 1-2 tablets (10-20 mg total) by mouth 2 (two) times daily as needed for anxiety. For severe anxiety 60 tablet 5   meclizine  (ANTIVERT ) 12.5 MG tablet Take 1 tablet (12.5 mg total) by mouth 3 (three) times daily as needed for dizziness or nausea. 90 tablet 1   ondansetron  (ZOFRAN -ODT) 4 MG disintegrating tablet Take 1 tablet (4 mg total) by mouth every 8 (eight) hours as needed  for nausea or vomiting. 20 tablet 5   Polypodium Leucotomos (HELIOCARE PO) Take by mouth.     spironolactone  (ALDACTONE ) 100 MG tablet Take 1 tablet (100 mg total) by mouth at bedtime. For acne 90 tablet 1   spironolactone  (ALDACTONE ) 25 MG tablet Take 1 tablet (25 mg total) by mouth daily. 90 tablet 1   No current facility-administered medications for this visit.    OBJECTIVE: Vitals:   04/08/24 1351  BP: (!) 140/85  Pulse: 82  Resp: 18  Temp: 98.2 F (36.8 C)  SpO2: 100%     Body mass index is 24 kg/m.    ECOG FS:0 - Asymptomatic  General: Well-developed, well-nourished, no acute distress. Eyes: Pink conjunctiva, anicteric sclera. HEENT: Normocephalic, moist mucous membranes. Lungs: No audible wheezing or coughing. Heart: Regular rate and rhythm. Abdomen: Soft, nontender, no obvious distention. Musculoskeletal: No edema, cyanosis, or clubbing. Neuro: Alert, answering all questions appropriately. Cranial nerves grossly intact. Skin: No rashes or petechiae noted. Psych: Normal affect. Lymphatics: No cervical, calvicular, axillary or inguinal LAD.   LAB RESULTS:  Lab Results  Component Value Date   NA 138 03/21/2024   K 4.4 03/21/2024   CL 101 03/21/2024   CO2 21 03/21/2024   GLUCOSE 83 03/21/2024   BUN 10 03/21/2024   CREATININE 0.70 03/21/2024   CALCIUM 9.8 03/21/2024   PROT 7.4 03/21/2024   ALBUMIN 4.9 03/21/2024   AST 16 03/21/2024   ALT 12 03/21/2024   ALKPHOS 53 03/21/2024   BILITOT 0.4 03/21/2024   GFRNONAA 116 11/17/2019   GFRAA 134 11/17/2019    Lab Results  Component Value Date   WBC 6.9 03/21/2024   NEUTROABS 4.7 03/21/2024   HGB 15.8 03/21/2024   HCT 47.6 (H) 03/21/2024   MCV 86 03/21/2024   PLT 235 03/21/2024     STUDIES: No results found.  ASSESSMENT: Erythrocytosis.  PLAN:    Erythrocytosis: Patient's most recent hemoglobin was within normal limits.  She had a mildly decreased iron saturation level, but the remainder of her iron  panel, B12, and folate levels were within normal limits.  Have ordered JAK2 mutation with reflex, hemochromatosis mutation, and hemoglobin fractionation cascade for completeness.  No intervention is needed.  Patient does not require bone marrow biopsy.  Return to clinic in 3 months with repeat laboratory work and further evaluation.  I spent a total of 45 minutes reviewing chart data, face-to-face evaluation with the patient, counseling and coordination of care as detailed above.   Patient expressed understanding and was in agreement with this plan. She also understands that She can call clinic at any time with any questions, concerns, or complaints.    Evalene JINNY Reusing, MD   04/09/2024 3:33 PM

## 2024-04-08 NOTE — Progress Notes (Unsigned)
 Patient isnt having any symptoms

## 2024-04-28 ENCOUNTER — Encounter: Payer: Self-pay | Admitting: Oncology

## 2024-05-04 ENCOUNTER — Encounter: Payer: Self-pay | Admitting: Family Medicine

## 2024-05-04 ENCOUNTER — Encounter: Payer: Self-pay | Admitting: Nurse Practitioner

## 2024-06-09 ENCOUNTER — Encounter: Payer: Self-pay | Admitting: Nurse Practitioner

## 2024-06-09 DIAGNOSIS — R790 Abnormal level of blood mineral: Secondary | ICD-10-CM

## 2024-06-09 DIAGNOSIS — E782 Mixed hyperlipidemia: Secondary | ICD-10-CM

## 2024-06-09 DIAGNOSIS — R718 Other abnormality of red blood cells: Secondary | ICD-10-CM

## 2024-06-09 DIAGNOSIS — D751 Secondary polycythemia: Secondary | ICD-10-CM

## 2024-06-09 DIAGNOSIS — E611 Iron deficiency: Secondary | ICD-10-CM

## 2024-06-09 DIAGNOSIS — E538 Deficiency of other specified B group vitamins: Secondary | ICD-10-CM

## 2024-06-11 LAB — IRON,TIBC AND FERRITIN PANEL
Ferritin: 51 ng/mL (ref 15–150)
Iron Saturation: 19 % (ref 15–55)
Iron: 58 ug/dL (ref 27–159)
Total Iron Binding Capacity: 313 ug/dL (ref 250–450)
UIBC: 255 ug/dL (ref 131–425)

## 2024-06-11 LAB — CBC WITH DIFFERENTIAL/PLATELET
Basophils Absolute: 0 x10E3/uL (ref 0.0–0.2)
Basos: 0 %
EOS (ABSOLUTE): 0.1 x10E3/uL (ref 0.0–0.4)
Eos: 1 %
Hematocrit: 47.8 % — ABNORMAL HIGH (ref 34.0–46.6)
Hemoglobin: 15.3 g/dL (ref 11.1–15.9)
Immature Grans (Abs): 0 x10E3/uL (ref 0.0–0.1)
Immature Granulocytes: 0 %
Lymphocytes Absolute: 2 x10E3/uL (ref 0.7–3.1)
Lymphs: 27 %
MCH: 28.5 pg (ref 26.6–33.0)
MCHC: 32 g/dL (ref 31.5–35.7)
MCV: 89 fL (ref 79–97)
Monocytes Absolute: 0.5 x10E3/uL (ref 0.1–0.9)
Monocytes: 6 %
Neutrophils Absolute: 4.7 x10E3/uL (ref 1.4–7.0)
Neutrophils: 66 %
Platelets: 194 x10E3/uL (ref 150–450)
RBC: 5.37 x10E6/uL — ABNORMAL HIGH (ref 3.77–5.28)
RDW: 12.8 % (ref 11.7–15.4)
WBC: 7.3 x10E3/uL (ref 3.4–10.8)

## 2024-06-11 LAB — B12 AND FOLATE PANEL
Folate: 19.3 ng/mL
Vitamin B-12: 456 pg/mL (ref 232–1245)

## 2024-06-11 LAB — LIPID PANEL
Chol/HDL Ratio: 3.7 ratio (ref 0.0–4.4)
Cholesterol, Total: 172 mg/dL (ref 100–199)
HDL: 46 mg/dL
LDL Chol Calc (NIH): 114 mg/dL — ABNORMAL HIGH (ref 0–99)
Triglycerides: 59 mg/dL (ref 0–149)
VLDL Cholesterol Cal: 12 mg/dL (ref 5–40)

## 2024-06-16 ENCOUNTER — Ambulatory Visit (INDEPENDENT_AMBULATORY_CARE_PROVIDER_SITE_OTHER): Payer: Managed Care, Other (non HMO) | Admitting: Nurse Practitioner

## 2024-06-16 ENCOUNTER — Encounter: Payer: Self-pay | Admitting: Nurse Practitioner

## 2024-06-16 VITALS — BP 134/76 | HR 84 | Temp 97.5°F | Resp 16 | Ht 60.0 in | Wt 123.2 lb

## 2024-06-16 DIAGNOSIS — E782 Mixed hyperlipidemia: Secondary | ICD-10-CM

## 2024-06-16 DIAGNOSIS — F411 Generalized anxiety disorder: Secondary | ICD-10-CM

## 2024-06-16 DIAGNOSIS — R42 Dizziness and giddiness: Secondary | ICD-10-CM

## 2024-06-16 DIAGNOSIS — E538 Deficiency of other specified B group vitamins: Secondary | ICD-10-CM

## 2024-06-16 DIAGNOSIS — Z0001 Encounter for general adult medical examination with abnormal findings: Secondary | ICD-10-CM | POA: Diagnosis not present

## 2024-06-16 DIAGNOSIS — G43A0 Cyclical vomiting, not intractable: Secondary | ICD-10-CM | POA: Diagnosis not present

## 2024-06-16 DIAGNOSIS — E559 Vitamin D deficiency, unspecified: Secondary | ICD-10-CM

## 2024-06-16 DIAGNOSIS — D508 Other iron deficiency anemias: Secondary | ICD-10-CM | POA: Diagnosis not present

## 2024-06-16 DIAGNOSIS — D751 Secondary polycythemia: Secondary | ICD-10-CM

## 2024-06-16 MED ORDER — MECLIZINE HCL 12.5 MG PO TABS: 12.5000 mg | ORAL_TABLET | Freq: Three times a day (TID) | ORAL | 1 refills | Status: AC | PRN

## 2024-06-16 MED ORDER — HYDROXYZINE HCL 10 MG PO TABS: 10.0000 mg | ORAL_TABLET | Freq: Two times a day (BID) | ORAL | 5 refills | Status: AC | PRN

## 2024-06-16 MED ORDER — ONDANSETRON 4 MG PO TBDP: 4.0000 mg | ORAL_TABLET | Freq: Three times a day (TID) | ORAL | 5 refills | Status: AC | PRN

## 2024-06-16 NOTE — Progress Notes (Signed)
 [facility] [address] [place], [place] [address]  Internal MEDICINE  Office Visit Note  Patient Name: Ebony Lawson  [id number]  [id number]  Date of Service: 06/16/2024  Chief Complaint  Patient presents with   Follow-up    HPI Laury presents for an annual well visit and physical exam.  Well-appearing 39 y.o. female with iron deficiency anemia, GAD and arthritis  Routine mammogram: not due for a few more years  Pap smear: due in December 2027 Labs: done recently  Iron and ferritin have improved, iron saturation has improved to normal range.  Mildly elevated LDL 114, the rest of the cholesterol panel was normal. slightly New or worsening pain: none  Other concerns: none     Current Medication: Outpatient Encounter Medications as of 06/16/2024  Medication Sig   MIEBO 1.338 GM/ML SOLN    Adapalene  0.3 % gel APPLY PEA SIZED AMOUNT TOPICALLY TO FACE EVERY NIGHT   Azelaic Acid  15 % gel After skin is thoroughly washed and patted dry, gently but thoroughly massage a thin film of azelaic acid  cream into the affected area twice daily, in the morning and evening.   Calcium Carbonate-Vitamin D  (CALCIUM 600+D3 PO) Take 1 tablet by mouth daily.   etonogestrel  (NEXPLANON ) 68 MG IMPL implant 1 each by Subdermal route once.   Fe Cbn-Fe Gluc-FA-B12-C-DSS (FERRALET  90) 90-1 MG TABS Take 1 tablet by mouth daily.   hydrOXYzine  (ATARAX ) 10 MG tablet Take 1-2 tablets (10-20 mg total) by mouth 2 (two) times daily as needed for anxiety. For severe anxiety   meclizine  (ANTIVERT ) 12.5 MG tablet Take 1 tablet (12.5 mg total) by mouth 3 (three) times daily as needed for dizziness or nausea.   ondansetron  (ZOFRAN -ODT) 4 MG disintegrating tablet Take 1 tablet (4 mg total) by mouth every 8 (eight) hours as needed for nausea or vomiting.   Polypodium Leucotomos (HELIOCARE PO) Take by mouth.   spironolactone  (ALDACTONE ) 100 MG tablet Take 1 tablet (100 mg total) by mouth at bedtime. For acne    spironolactone  (ALDACTONE ) 25 MG tablet Take 1 tablet (25 mg total) by mouth daily.   [DISCONTINUED] clindamycin  (CLEOCIN  T) 1 % external solution APPLY TOPICALLY TO FACE AT NIGHT, BEFORE ADAPALENE , FOR ACNE (Patient not taking: Reported on 06/16/2024)   [DISCONTINUED] COVID-19 mRNA vaccine, Pfizer, (COMIRNATY) syringe Inject 0.3 mLs into the muscle once as needed for up to 1 dose (covid vaccination).   [DISCONTINUED] hydrOXYzine  (ATARAX ) 10 MG tablet Take 1-2 tablets (10-20 mg total) by mouth 2 (two) times daily as needed for anxiety. For severe anxiety   [DISCONTINUED] meclizine  (ANTIVERT ) 12.5 MG tablet Take 1 tablet (12.5 mg total) by mouth 3 (three) times daily as needed for dizziness or nausea.   [DISCONTINUED] ondansetron  (ZOFRAN -ODT) 4 MG disintegrating tablet Take 1 tablet (4 mg total) by mouth every 8 (eight) hours as needed for nausea or vomiting.   No facility-administered encounter medications on file as of 06/16/2024.    Surgical History: History reviewed. No pertinent surgical history.  Medical History: Past Medical History:  Diagnosis Date   Heart murmur    Migraine     Family History: Family History  Problem Relation Age of Onset   Hyperlipidemia Mother    Cancer Father        prostate and skin cancer   Hyperlipidemia Father    Heart disease Father        CAD - triple bypass 2014   Hypertension Father    Melanoma Father  Hearing loss Paternal Grandfather    Hypertension Paternal Grandfather    Anxiety disorder Brother    Breast cancer Neg Hx     Social History   Socioeconomic History   Marital status: Single    Spouse name: Not on file   Number of children: 0   Years of education: 16   Highest education level: Bachelor's degree (e.g., BA, AB, BS)  Occupational History   Occupation: DNA Administrator, [employer]: [employer]  Tobacco Use   Smoking status: Never   Smokeless tobacco: Never  Vaping Use   Vaping status: Never Used  Substance and  Sexual Activity   Alcohol use: Yes    Comment: once a week   Drug use: No   Sexual activity: Yes    Partners: Male    Birth control/protection: OCP    Comment: 1 partner   Other Topics Concern   Not on file  Social History Narrative   Kaylene grew up outside of [place]. 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       Hobbies - plays trumpet   Exercise - none at present   Caffeine - rare    Social Drivers of Health   Tobacco Use: Low Risk (06/16/2024)   Patient History    Smoking Tobacco Use: Never    Smokeless Tobacco Use: Never    Passive Exposure: Not on file  Financial Resource Strain: Not on file  Food Insecurity: Not on file  Transportation Needs: Not on file  Physical Activity: Not on file  Stress: Not on file  Social Connections: Not on file  Intimate Partner Violence: Not on file  Depression (PHQ2-9): Low Risk (06/16/2024)   Depression (PHQ2-9)    PHQ-2 Score: 0  Alcohol Screen: Low Risk (11/24/2021)   Alcohol Screen    Last Alcohol Screening Score (AUDIT): 1  Housing: Not on file  Utilities: Not on file  Health Literacy: Not on file      Review of Systems  Constitutional:  Negative for activity change, appetite change, chills, fatigue, fever and unexpected weight change.  HENT: Negative.  Negative for congestion, ear pain, rhinorrhea, sore throat and trouble swallowing.   Eyes: Negative.   Respiratory: Negative.  Negative for cough, chest tightness, shortness of breath and wheezing.   Cardiovascular: Negative.  Negative for chest pain.  Gastrointestinal: Negative.  Negative for abdominal pain, blood in stool, constipation, diarrhea, nausea and vomiting.  Endocrine: Negative.   Genitourinary: Negative.  Negative for difficulty urinating, dysuria, frequency, hematuria and urgency.  Musculoskeletal: Negative.  Negative for arthralgias, back pain, joint swelling, myalgias and neck  pain.  Skin: Negative.  Negative for rash and wound.  Allergic/Immunologic: Negative.  Negative for immunocompromised state.  Neurological: Negative.  Negative for dizziness, seizures, numbness and headaches.  Hematological: Negative.   Psychiatric/Behavioral: Negative.  Negative for behavioral problems, self-injury and suicidal ideas. The patient is not nervous/anxious.     Vital Signs: BP 134/76   Pulse 84   Temp (!) 97.5 F (36.4 C)   Resp 16   Ht 5' (1.524 m)   Wt 123 lb 3.2 oz (55.9 kg)   SpO2 98%   BMI 24.06 kg/m    Physical Exam Vitals reviewed.  Constitutional:      General: She is awake. She is not in acute distress.    Appearance: Normal appearance. She is well-developed, well-groomed and  normal weight. She is not diaphoretic.  HENT:     Head: Normocephalic and atraumatic.     Right Ear: Tympanic membrane, ear canal and external ear normal.     Left Ear: Tympanic membrane, ear canal and external ear normal.     Nose: Nose normal. No congestion or rhinorrhea.     Mouth/Throat:     Mouth: Mucous membranes are moist.     Pharynx: Oropharynx is clear. No oropharyngeal exudate or posterior oropharyngeal erythema.  Eyes:     General: No scleral icterus.       Right eye: No discharge.        Left eye: No discharge.     Extraocular Movements: Extraocular movements intact.     Conjunctiva/sclera: Conjunctivae normal.     Pupils: Pupils are equal, round, and reactive to light.  Neck:     Thyroid: No thyromegaly.     Vascular: No JVD.     Trachea: No tracheal deviation.  Cardiovascular:     Rate and Rhythm: Normal rate and regular rhythm.     Pulses: Normal pulses.     Heart sounds: Normal heart sounds, S1 normal and S2 normal. No murmur heard.    No friction rub. No gallop.  Pulmonary:     Effort: Pulmonary effort is normal. No respiratory distress.     Breath sounds: Normal breath sounds. No stridor. No wheezing or rales.  Chest:     Chest wall: No tenderness.   Abdominal:     General: Bowel sounds are normal. There is no distension.     Palpations: Abdomen is soft. There is no mass.     Tenderness: There is no abdominal tenderness. There is no guarding or rebound.  Genitourinary:    Uterus: Normal.      Adnexa: Right adnexa normal and left adnexa normal.  Musculoskeletal:        General: No tenderness or deformity. Normal range of motion.     Cervical back: Normal range of motion and neck supple.  Lymphadenopathy:     Cervical: No cervical adenopathy.  Skin:    General: Skin is warm and dry.     Capillary Refill: Capillary refill takes less than 2 seconds.     Coloration: Skin is not pale.     Findings: No erythema or rash.  Neurological:     Mental Status: She is alert and oriented to person, place, and time.     Cranial Nerves: No cranial nerve deficit.     Motor: No abnormal muscle tone.     Coordination: Coordination normal.     Gait: Gait normal.     Deep Tendon Reflexes: Reflexes are normal and symmetric.  Psychiatric:        Mood and Affect: Mood normal.        Behavior: Behavior normal. Behavior is cooperative.        Thought Content: Thought content normal.        Judgment: Judgment normal.        Assessment/Plan: 1. Encounter for routine adult health examination with abnormal findings (Primary) Age-appropriate preventive screenings and vaccinations discussed, annual physical exam completed. Routine labs for health maintenance results discussed with the patient. PHM updated.   - MIEBO 1.338 GM/ML SOLN - hydrOXYzine  (ATARAX ) 10 MG tablet; Take 1-2 tablets (10-20 mg total) by mouth 2 (two) times daily as needed for anxiety. For severe anxiety  Dispense: 60 tablet; Refill: 5  2. Other iron deficiency anemia Followed by hematology  3. Cyclical vomiting associated with nonintractable migraine Continue prn zofran  as prescribed.  - ondansetron  (ZOFRAN -ODT) 4 MG disintegrating tablet; Take 1 tablet (4 mg total) by mouth  every 8 (eight) hours as needed for nausea or vomiting.  Dispense: 20 tablet; Refill: 5  4. Erythrocytosis Being monitored by hematology   5. Mixed hyperlipidemia Mildly elevated, continue low cholesterol low fat diet and physical activity as tolerated.   6. Dizziness Continue prn meclizine  as prescribed  - meclizine  (ANTIVERT ) 12.5 MG tablet; Take 1 tablet (12.5 mg total) by mouth 3 (three) times daily as needed for dizziness or nausea.  Dispense: 90 tablet; Refill: 1  7. B12 deficiency Continue B12 supplement   8. Vitamin D  deficiency Continue OTC vitamin D  supplement.   9. GAD (generalized anxiety disorder) Continue prn hydroxyzine  as prescribed  - hydrOXYzine  (ATARAX ) 10 MG tablet; Take 1-2 tablets (10-20 mg total) by mouth 2 (two) times daily as needed for anxiety. For severe anxiety  Dispense: 60 tablet; Refill: 5     General Counseling: Maxwell verbalizes understanding of the findings of todays visit and agrees with plan of treatment. I have discussed any further diagnostic evaluation that may be needed or ordered today. We also reviewed her medications today. she has been encouraged to call the office with any questions or concerns that should arise related to todays visit.    No orders of the defined types were placed in this encounter.   Meds ordered this encounter  Medications   ondansetron  (ZOFRAN -ODT) 4 MG disintegrating tablet    Sig: Take 1 tablet (4 mg total) by mouth every 8 (eight) hours as needed for nausea or vomiting.    Dispense:  20 tablet    Refill:  5   meclizine  (ANTIVERT ) 12.5 MG tablet    Sig: Take 1 tablet (12.5 mg total) by mouth 3 (three) times daily as needed for dizziness or nausea.    Dispense:  90 tablet    Refill:  1    For future refills, do not fill unless patient calls for it   hydrOXYzine  (ATARAX ) 10 MG tablet    Sig: Take 1-2 tablets (10-20 mg total) by mouth 2 (two) times daily as needed for anxiety. For severe anxiety    Dispense:   60 tablet    Refill:  5    For future refills, do not fill, patient will call if she needs it    Return in about 6 months (around 12/14/2024) for F/U, Merrik Puebla PCP.   Total time spent:30 Minutes Time spent includes review of chart, medications, test results, and follow up plan with the patient.   [place] Controlled Substance Database was reviewed by me.  This patient was seen by Mardy Maxin, FNP-C in collaboration with Dr. Sigrid Bathe as a part of collaborative care agreement.  Katlin Bortner R. Maxin, MSN, FNP-C Internal medicine

## 2024-07-22 ENCOUNTER — Inpatient Hospital Stay

## 2024-07-23 ENCOUNTER — Inpatient Hospital Stay: Admitting: Oncology

## 2024-12-15 ENCOUNTER — Ambulatory Visit: Admitting: Nurse Practitioner

## 2025-01-29 ENCOUNTER — Ambulatory Visit

## 2025-06-17 ENCOUNTER — Encounter: Admitting: Nurse Practitioner
